# Patient Record
Sex: Female | Born: 1990 | Race: Black or African American | Hispanic: No | Marital: Single | Smoking: Former smoker
Health system: Southern US, Community
[De-identification: ages and names within clinical notes are randomized; demographics above are authoritative.]

## PROBLEM LIST (undated history)

## (undated) DIAGNOSIS — O34219 Maternal care for unspecified type scar from previous cesarean delivery: Secondary | ICD-10-CM

## (undated) HISTORY — PX: TONSILLECTOMY: SUR1361

## (undated) HISTORY — DX: Maternal care for unspecified type scar from previous cesarean delivery: O34.219

---

## 2018-04-15 ENCOUNTER — Ambulatory Visit: Payer: Self-pay | Admitting: Family Medicine

## 2018-06-02 ENCOUNTER — Encounter: Payer: Self-pay | Admitting: Family Medicine

## 2018-06-02 ENCOUNTER — Other Ambulatory Visit: Payer: Self-pay

## 2018-06-02 ENCOUNTER — Ambulatory Visit (INDEPENDENT_AMBULATORY_CARE_PROVIDER_SITE_OTHER): Payer: Medicaid Other | Admitting: Family Medicine

## 2018-06-02 VITALS — BP 112/68 | HR 85 | Temp 98.4°F | Ht 63.0 in | Wt 250.4 lb

## 2018-06-02 DIAGNOSIS — D649 Anemia, unspecified: Secondary | ICD-10-CM | POA: Insufficient documentation

## 2018-06-02 DIAGNOSIS — O99019 Anemia complicating pregnancy, unspecified trimester: Secondary | ICD-10-CM

## 2018-06-02 DIAGNOSIS — Z23 Encounter for immunization: Secondary | ICD-10-CM | POA: Diagnosis not present

## 2018-06-02 DIAGNOSIS — Z114 Encounter for screening for human immunodeficiency virus [HIV]: Secondary | ICD-10-CM

## 2018-06-02 DIAGNOSIS — D509 Iron deficiency anemia, unspecified: Secondary | ICD-10-CM | POA: Insufficient documentation

## 2018-06-02 DIAGNOSIS — Z0000 Encounter for general adult medical examination without abnormal findings: Secondary | ICD-10-CM

## 2018-06-02 DIAGNOSIS — D508 Other iron deficiency anemias: Secondary | ICD-10-CM

## 2018-06-02 DIAGNOSIS — L2082 Flexural eczema: Secondary | ICD-10-CM

## 2018-06-02 HISTORY — DX: Anemia complicating pregnancy, unspecified trimester: O99.019

## 2018-06-02 MED ORDER — TRIAMCINOLONE ACETONIDE 0.05 % EX OINT: 1.0000 "application " | TOPICAL_OINTMENT | Freq: Every day | CUTANEOUS | 0 refills | Status: DC

## 2018-06-02 NOTE — Patient Instructions (Addendum)
It was nice meeting you today Chelsea Baldwin!  I am prescribing a triamcinolone ointment for your eczema.  Apply this once per day and stop using this medication after two weeks.  You can start it back as needed after stopping it for a week or two.  Today, we are giving you your flu shot and Tdap shot.  We are also getting some blood tests and will let you know if they are abnormal.  For your throat, try Tylenol, Zyrtec, and hot tea with honey and lemon.  If you have any questions or concerns, please feel free to call the clinic.   Be well,  Dr. Frances Furbish  DASH Eating Plan DASH stands for "Dietary Approaches to Stop Hypertension." The DASH eating plan is a healthy eating plan that has been shown to reduce high blood pressure (hypertension). It may also reduce your risk for type 2 diabetes, heart disease, and stroke. The DASH eating plan may also help with weight loss. What are tips for following this plan? General guidelines  Avoid eating more than 2,300 mg (milligrams) of salt (sodium) a day. If you have hypertension, you may need to reduce your sodium intake to 1,500 mg a day.  Limit alcohol intake to no more than 1 drink a day for nonpregnant women and 2 drinks a day for men. One drink equals 12 oz of beer, 5 oz of wine, or 1 oz of hard liquor.  Work with your health care provider to maintain a healthy body weight or to lose weight. Ask what an ideal weight is for you.  Get at least 30 minutes of exercise that causes your heart to beat faster (aerobic exercise) most days of the week. Activities may include walking, swimming, or biking.  Work with your health care provider or diet and nutrition specialist (dietitian) to adjust your eating plan to your individual calorie needs. Reading food labels  Check food labels for the amount of sodium per serving. Choose foods with less than 5 percent of the Daily Value of sodium. Generally, foods with less than 300 mg of sodium per serving fit into  this eating plan.  To find whole grains, look for the word "whole" as the first word in the ingredient list. Shopping  Buy products labeled as "low-sodium" or "no salt added."  Buy fresh foods. Avoid canned foods and premade or frozen meals. Cooking  Avoid adding salt when cooking. Use salt-free seasonings or herbs instead of table salt or sea salt. Check with your health care provider or pharmacist before using salt substitutes.  Do not fry foods. Cook foods using healthy methods such as baking, boiling, grilling, and broiling instead.  Cook with heart-healthy oils, such as olive, canola, soybean, or sunflower oil. Meal planning   Eat a balanced diet that includes: ? 5 or more servings of fruits and vegetables each day. At each meal, try to fill half of your plate with fruits and vegetables. ? Up to 6-8 servings of whole grains each day. ? Less than 6 oz of lean meat, poultry, or fish each day. A 3-oz serving of meat is about the same size as a deck of cards. One egg equals 1 oz. ? 2 servings of low-fat dairy each day. ? A serving of nuts, seeds, or beans 5 times each week. ? Heart-healthy fats. Healthy fats called Omega-3 fatty acids are found in foods such as flaxseeds and coldwater fish, like sardines, salmon, and mackerel.  Limit how much you eat of the  following: ? Canned or prepackaged foods. ? Food that is high in trans fat, such as fried foods. ? Food that is high in saturated fat, such as fatty meat. ? Sweets, desserts, sugary drinks, and other foods with added sugar. ? Full-fat dairy products.  Do not salt foods before eating.  Try to eat at least 2 vegetarian meals each week.  Eat more home-cooked food and less restaurant, buffet, and fast food.  When eating at a restaurant, ask that your food be prepared with less salt or no salt, if possible. What foods are recommended? The items listed may not be a complete list. Talk with your dietitian about what dietary  choices are best for you. Grains Whole-grain or whole-wheat bread. Whole-grain or whole-wheat pasta. Brown rice. Orpah Cobb. Bulgur. Whole-grain and low-sodium cereals. Pita bread. Low-fat, low-sodium crackers. Whole-wheat flour tortillas. Vegetables Fresh or frozen vegetables (raw, steamed, roasted, or grilled). Low-sodium or reduced-sodium tomato and vegetable juice. Low-sodium or reduced-sodium tomato sauce and tomato paste. Low-sodium or reduced-sodium canned vegetables. Fruits All fresh, dried, or frozen fruit. Canned fruit in natural juice (without added sugar). Meat and other protein foods Skinless chicken or [place]. Ground chicken or [place]. Pork with fat trimmed off. Fish and seafood. Egg whites. Dried beans, peas, or lentils. Unsalted nuts, nut butters, and seeds. Unsalted canned beans. Lean cuts of beef with fat trimmed off. Low-sodium, lean deli meat. Dairy Low-fat (1%) or fat-free (skim) milk. Fat-free, low-fat, or reduced-fat cheeses. Nonfat, low-sodium ricotta or cottage cheese. Low-fat or nonfat yogurt. Low-fat, low-sodium cheese. Fats and oils Soft margarine without trans fats. Vegetable oil. Low-fat, reduced-fat, or light mayonnaise and salad dressings (reduced-sodium). Canola, safflower, olive, soybean, and sunflower oils. Avocado. Seasoning and other foods Herbs. Spices. Seasoning mixes without salt. Unsalted popcorn and pretzels. Fat-free sweets. What foods are not recommended? The items listed may not be a complete list. Talk with your dietitian about what dietary choices are best for you. Grains Baked goods made with fat, such as croissants, muffins, or some breads. Dry pasta or rice meal packs. Vegetables Creamed or fried vegetables. Vegetables in a cheese sauce. Regular canned vegetables (not low-sodium or reduced-sodium). Regular canned tomato sauce and paste (not low-sodium or reduced-sodium). Regular tomato and vegetable juice (not low-sodium or reduced-sodium).  Rosita Fire. Olives. Fruits Canned fruit in a light or heavy syrup. Fried fruit. Fruit in cream or butter sauce. Meat and other protein foods Fatty cuts of meat. Ribs. Fried meat. Tomasa Blase. Sausage. Bologna and other processed lunch meats. Salami. Fatback. Hotdogs. Bratwurst. Salted nuts and seeds. Canned beans with added salt. Canned or smoked fish. Whole eggs or egg yolks. Chicken or [place] with skin. Dairy Whole or 2% milk, cream, and half-and-half. Whole or full-fat cream cheese. Whole-fat or sweetened yogurt. Full-fat cheese. Nondairy creamers. Whipped toppings. Processed cheese and cheese spreads. Fats and oils Butter. Stick margarine. Lard. Shortening. Ghee. Bacon fat. Tropical oils, such as coconut, palm kernel, or palm oil. Seasoning and other foods Salted popcorn and pretzels. Onion salt, garlic salt, seasoned salt, table salt, and sea salt. Worcestershire sauce. Tartar sauce. Barbecue sauce. Teriyaki sauce. Soy sauce, including reduced-sodium. Steak sauce. Canned and packaged gravies. Fish sauce. Oyster sauce. Cocktail sauce. Horseradish that you find on the shelf. Ketchup. Mustard. Meat flavorings and tenderizers. Bouillon cubes. Hot sauce and Tabasco sauce. Premade or packaged marinades. Premade or packaged taco seasonings. Relishes. Regular salad dressings. Where to find more information:  National Heart, Lung, and Blood Institute: PopSteam.is  American Heart Association: www.heart.org  Summary  The DASH eating plan is a healthy eating plan that has been shown to reduce high blood pressure (hypertension). It may also reduce your risk for type 2 diabetes, heart disease, and stroke.  With the DASH eating plan, you should limit salt (sodium) intake to 2,300 mg a day. If you have hypertension, you may need to reduce your sodium intake to 1,500 mg a day.  When on the DASH eating plan, aim to eat more fresh fruits and vegetables, whole grains, lean proteins, low-fat dairy, and  heart-healthy fats.  Work with your health care provider or diet and nutrition specialist (dietitian) to adjust your eating plan to your individual calorie needs. This information is not intended to replace advice given to you by your health care provider. Make sure you discuss any questions you have with your health care provider. Document Released: 07/26/2011 Document Revised: 07/30/2016 Document Reviewed: 07/30/2016 Elsevier Interactive Patient Education  [employer].

## 2018-06-02 NOTE — Progress Notes (Signed)
   Subjective:    Chelsea Baldwin - 27 y.o. female MRN [id number]  Date of birth: 1991/07/29  HPI  Chelsea Baldwin is here to establish care.  She would also like to discuss mucous in her throat and dry patches on her skin.  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  No cigarettes, marijuana, alcohol, other drugs.  Working on having a healthier diet    Health Maintenance:  -Would like flu and Tdap shots today and HIV screening today.  Got her Pap smear earlier this year at another practice. Health Maintenance Due  Topic Date Due  . HIV Screening  05/07/2006  . PAP SMEAR  05/07/2012    -  reports that she has never smoked. She has never used smokeless tobacco. - Review of Systems: Per HPI.  No fever, shortness of breath, chest pain, unintentional weight loss, changes in bowels, changes in urination, easy bleeding, changes in mood, or changes in neurological status - Past Medical History: Patient Active Problem List   Diagnosis Date Noted  . Encounter for medical examination to establish care 06/03/2018  . Encounter for screening for HIV 06/02/2018  . Absolute anemia 06/02/2018  . Flexural eczema 06/02/2018   - Medications: reviewed and updated   Objective:   Physical Exam BP 112/68   Pulse 85   Temp 98.4 F (36.9 C) (Oral)   Ht 5\' 3"  (1.6 m)   Wt  250 lb 6.4 oz (113.6 kg)   LMP 05/22/2018 (Approximate)   SpO2 96%   BMI 44.36 kg/m  Gen: NAD, alert, cooperative with exam, well-appearing, obese, pleasant HEENT: NCAT, PERRL, clear conjunctiva, oropharynx clear, supple neck CV: RRR, good S1/S2, no murmur, no edema, capillary refill brisk  Resp: CTABL, no wheezes, non-labored Abd: SNTND, BS present, no guarding or organomegaly Skin: no rashes, normal turgor  Neuro: no gross deficits.  Psych: good insight, alert and oriented        Assessment & Plan:   Encounter for medical examination to establish care Reviewed patient's history and did a complete physical exam today.  Gave patient flu and Tdap vaccinations.  Will obtain past medical records to document that she has had her Pap smear earlier this year.  Counseled patient on lifestyle modifications and gave handout for DASH eating plan.  Flexural eczema Prescribed triamcinolone cream for treatment.  Absolute anemia Will obtain CBC today.  Encounter for screening for HIV Will screen for HIV today.    Lezlie Octave, M.D. 06/03/2018, 2:57 PM PGY-2, [facility]

## 2018-06-03 ENCOUNTER — Encounter: Payer: Self-pay | Admitting: Family Medicine

## 2018-06-03 DIAGNOSIS — Z0000 Encounter for general adult medical examination without abnormal findings: Secondary | ICD-10-CM | POA: Insufficient documentation

## 2018-06-03 LAB — CBC
HEMATOCRIT: 35.5 % (ref 34.0–46.6)
HEMOGLOBIN: 11.9 g/dL (ref 11.1–15.9)
MCH: 27.5 pg (ref 26.6–33.0)
MCHC: 33.5 g/dL (ref 31.5–35.7)
MCV: 82 fL (ref 79–97)
Platelets: 347 10*3/uL (ref 150–450)
RBC: 4.33 x10E6/uL (ref 3.77–5.28)
RDW: 13.7 % (ref 12.3–15.4)
WBC: 7.4 10*3/uL (ref 3.4–10.8)

## 2018-06-03 LAB — HIV ANTIBODY (ROUTINE TESTING W REFLEX): HIV Screen 4th Generation wRfx: NONREACTIVE

## 2018-06-03 NOTE — Assessment & Plan Note (Signed)
Prescribed triamcinolone cream for treatment.

## 2018-06-03 NOTE — Assessment & Plan Note (Signed)
Will screen for HIV today.

## 2018-06-03 NOTE — Assessment & Plan Note (Signed)
Reviewed patient's history and did a complete physical exam today.  Gave patient flu and Tdap vaccinations.  Will obtain past medical records to document that she has had her Pap smear earlier this year.  Counseled patient on lifestyle modifications and gave handout for DASH eating plan.

## 2018-06-03 NOTE — Assessment & Plan Note (Signed)
Will obtain CBC today  

## 2018-06-19 ENCOUNTER — Telehealth: Payer: Self-pay | Admitting: *Deleted

## 2018-06-19 ENCOUNTER — Other Ambulatory Visit: Payer: Self-pay | Admitting: Family Medicine

## 2018-06-19 MED ORDER — TRIAMCINOLONE ACETONIDE 0.1 % EX CREA: 1.0000 "application " | TOPICAL_CREAM | Freq: Two times a day (BID) | CUTANEOUS | 0 refills | Status: DC

## 2018-06-19 NOTE — Telephone Encounter (Signed)
The triamcinolone 0.05% is on national backorder and they are not sure when it will be available.   Pharmacy is requesting to have script changed to the 1% in place of the above.   Will forward to MD to send in new script if appropriate. Fleeger, Maryjo Rochester, CMA

## 2018-06-19 NOTE — Telephone Encounter (Signed)
Triamcinolone 0.1% cream ordered.  Thanks!

## 2018-09-12 ENCOUNTER — Ambulatory Visit: Payer: Medicaid Other | Admitting: Family Medicine

## 2018-09-15 ENCOUNTER — Ambulatory Visit: Payer: Medicaid Other | Admitting: Family Medicine

## 2018-09-15 ENCOUNTER — Encounter: Payer: Self-pay | Admitting: Family Medicine

## 2018-09-15 ENCOUNTER — Ambulatory Visit: Payer: Medicaid Other

## 2018-09-15 ENCOUNTER — Other Ambulatory Visit: Payer: Self-pay

## 2018-09-15 DIAGNOSIS — Z3046 Encounter for surveillance of implantable subdermal contraceptive: Secondary | ICD-10-CM

## 2018-09-15 NOTE — Patient Instructions (Signed)
Call if redness or swelling at the site. Call if fever.

## 2018-09-15 NOTE — Progress Notes (Signed)
Established Patient Office Visit  Subjective:  Patient ID: Chelsea Baldwin, female    DOB: 06-May-1991  Age: 28 y.o. MRN: [id number]  CC:  Chief Complaint  Patient presents with  . Nexplanon Removal    HPI Chelsea Baldwin presents for nexplanon removal.  Having irregular periods.  Patient not currently in a sexual relationship.  Plans to use condoms or depo if she starts.  Nexplanon has been in "about 2 years."    History reviewed. No pertinent past medical history.  History reviewed. No pertinent surgical history.  History reviewed. No pertinent family history.  Social History   Socioeconomic History  . Marital status: Single    Spouse name: Not on file  . Number of children: Not on file  . Years of education: Not on file  . Highest education level: Not on file  Occupational History  . Not on file  Social Needs  . Financial resource strain: Not on file  . Food insecurity:    Worry: Not on file    Inability: Not on file  . Transportation needs:    Medical: Not on file    Non-medical: Not on file  Tobacco Use  . Smoking status: Never Smoker  . Smokeless tobacco: Never Used  Substance and Sexual Activity  . Alcohol use: Not on file  . Drug use: Not on file  . Sexual activity: Not on file  Lifestyle  . Physical activity:    Days per week: Not on file    Minutes per session: Not on file  . Stress: Not on file  Relationships  . Social connections:    Talks on phone: Not on file    Gets together: Not on file    Attends religious service: Not on file    Active member of club or organization: Not on file    Attends meetings of clubs or organizations: Not on file    Relationship status: Not on file  . Intimate partner violence:    Fear of current or ex partner: Not on file    Emotionally abused: Not on file    Physically abused: Not on file    Forced sexual activity: Not on file  Other Topics Concern  . Not on file  Social History Narrative  . Not on  file    Outpatient Medications Prior to Visit  Medication Sig Dispense Refill  . triamcinolone cream (KENALOG) 0.1 % Apply 1 application topically 2 (two) times daily. 30 g 0   No facility-administered medications prior to visit.     No Known Allergies  ROS Review of Systems    Objective:    Physical Exam  BP 108/74   Pulse 65   Temp 98.2 F (36.8 C) (Oral)   Ht 5' 3"  (1.6 m)   Wt 265 lb (120.2 kg)   LMP 09/15/2018   SpO2 99%   BMI 46.94 kg/m  Wt Readings from Last 3 Encounters:  09/15/18 265 lb (120.2 kg)  06/02/18 250 lb 6.4 oz (113.6 kg)   Nexplanon easily palpable in left arm.  Informed consent and time out taken.  Sterile prep and drape 2% lidocaine with epi.  Neplanon removed via 0.5cm incision without difficulty.  Patient tolerated procedure well and left clinic in good condition.  Health Maintenance Due  Topic Date Due  . PAP-Cervical Cytology Screening  05/07/2012  . PAP SMEAR-Modifier  05/07/2012    There are no preventive care reminders to display for this patient.  No results  found for: TSH Lab Results  Component Value Date   WBC 7.4 06/02/2018   HGB 11.9 06/02/2018   HCT 35.5 06/02/2018   MCV 82 06/02/2018   PLT 347 06/02/2018   No results found for: NA, K, CHLORIDE, CO2, GLUCOSE, BUN, CREATININE, BILITOT, ALKPHOS, AST, ALT, PROT, ALBUMIN, CALCIUM, ANIONGAP, EGFR, GFR No results found for: CHOL No results found for: HDL No results found for: LDLCALC No results found for: TRIG No results found for: CHOLHDL No results found for: HGBA1C    Assessment & Plan:   Problem List Items Addressed This Visit    None      No orders of the defined types were placed in this encounter.   Follow-up: No follow-ups on file.    Zenia Resides, MD

## 2018-09-15 NOTE — Assessment & Plan Note (Signed)
Removed without difficulty 

## 2018-10-30 ENCOUNTER — Encounter: Payer: Self-pay | Admitting: Family Medicine

## 2019-05-19 DIAGNOSIS — Z3200 Encounter for pregnancy test, result unknown: Secondary | ICD-10-CM | POA: Diagnosis not present

## 2019-05-19 DIAGNOSIS — Z3009 Encounter for other general counseling and advice on contraception: Secondary | ICD-10-CM | POA: Diagnosis not present

## 2019-06-10 ENCOUNTER — Other Ambulatory Visit: Payer: Self-pay

## 2019-06-10 ENCOUNTER — Ambulatory Visit (INDEPENDENT_AMBULATORY_CARE_PROVIDER_SITE_OTHER): Payer: Medicaid Other | Admitting: Family Medicine

## 2019-06-10 VITALS — BP 98/64 | HR 62 | Wt 249.0 lb

## 2019-06-10 DIAGNOSIS — Z3201 Encounter for pregnancy test, result positive: Secondary | ICD-10-CM | POA: Diagnosis not present

## 2019-06-10 DIAGNOSIS — Z3200 Encounter for pregnancy test, result unknown: Secondary | ICD-10-CM | POA: Diagnosis not present

## 2019-06-10 LAB — POCT URINE PREGNANCY: Preg Test, Ur: POSITIVE — AB

## 2019-06-10 NOTE — Patient Instructions (Addendum)
It was nice seeing you today, Chelsea Baldwin!  Congratulations on your pregnancy.  Today, we are getting prenatal labs and scheduling a dating ultrasound for you.  Please try MiraLAX for your constipation, and let me know if you need any medicine for your nausea if small meals and frequent sips of water are not helpful.  Please try prenatal yoga online, frequent walking, and Tylenol for your sciatica.  Continue taking prenatal vitamins daily.  You can switch to another type if that works better for you.  We will see you in the next week or 2 for your initial prenatal appointment.  If you have any questions or concerns, please feel free to call the clinic.   Be well,  Dr. Shan Levans

## 2019-06-10 NOTE — Assessment & Plan Note (Addendum)
Patient was counseled to continue taking prenatal vitamins daily, although she can take a prenatal vitamin without iron currently due to her constipation.  She was encouraged to use MiraLAX as well as getting daily exercise, drinking plenty of water, and eating plenty of fruits and vegetables for her constipation.  She was encouraged to eat small meals and taken small sips of water to improve her nausea.  Also was offered Diclegis, but she would like to hold off on this for now.  A dating ultrasound was ordered on patient request, and OB panel with HIV and hemoglobin fraction was obtained today.  She will plan to return in the next week or 2 for her initial OB appointment.  A UA with urine culture as well as GC/chlamydia testing will need to be performed at her next visit.  She is also a candidate for early Glucola testing due to her obesity and ethnicity.

## 2019-06-10 NOTE — Progress Notes (Signed)
   Subjective:    Chelsea Baldwin - 28 y.o. female MRN [id number]  Date of birth: December 09, 1990  CC:  Phyillis Baldwin is here for a positive pregnancy test.  HPI: Chelsea Baldwin says that she came in because she had a positive pregnancy test.  She is excited for her pregnancy.  Her last menstrual period was 04/18/2019.  She says that her periods are regular.  She denies vaginal bleeding and cramping so far in her pregnancy, although she does have nausea and fatigue as well as some constipation.  She is taking prenatal vitamins and does not smoke or consume alcohol.  Father of the baby and her family are supportive.  Health Maintenance:  -Declines flu shot today but will plan to get it at her next visit Health Maintenance Due  Topic Date Due  . INFLUENZA VACCINE  03/21/2019    -  reports that she has never smoked. She has never used smokeless tobacco. - Review of Systems: Per HPI. - Past Medical History: Patient Active Problem List   Diagnosis Date Noted  . Pregnancy confirmed by positive urine test 06/10/2019  . Encounter for Nexplanon removal 09/15/2018  . Encounter for medical examination to establish care 06/03/2018  . Encounter for screening for HIV 06/02/2018  . Absolute anemia 06/02/2018  . Flexural eczema 06/02/2018   - Medications: reviewed and updated   Objective:   Physical Exam BP 98/64   Pulse 62   Wt 249 lb (112.9 kg)   LMP 04/08/2019   SpO2 99%   BMI 44.11 kg/m  Gen: NAD, alert, cooperative with exam, well-appearing HEENT: NCAT, clear conjunctiva CV: RRR, good S1/S2, no murmur, no edema, capillary refill brisk  Resp: CTABL, no wheezes, non-labored     Assessment & Plan:   Pregnancy confirmed by positive urine test Patient was counseled to continue taking prenatal vitamins daily, although she can take a prenatal vitamin without iron currently due to her constipation.  She was encouraged to use MiraLAX as well as getting daily exercise, drinking plenty of  water, and eating plenty of fruits and vegetables for her constipation.  She was encouraged to eat small meals and taken small sips of water to improve her nausea.  Also was offered Diclegis, but she would like to hold off on this for now.  A dating ultrasound was ordered on patient request, and OB panel with HIV and hemoglobin fraction was obtained today.  She will plan to return in the next week or 2 for her initial OB appointment.  A UA with urine culture as well as GC/chlamydia testing will need to be performed at her next visit.  She is also a candidate for early Glucola testing due to her obesity and ethnicity.    Maia Breslow, M.D. 06/10/2019, 3:28 PM PGY-3, [place]

## 2019-06-12 LAB — OBSTETRIC PANEL, INCLUDING HIV
Antibody Screen: NEGATIVE
Basophils Absolute: 0 10*3/uL (ref 0.0–0.2)
Basos: 0 %
EOS (ABSOLUTE): 0.1 10*3/uL (ref 0.0–0.4)
Eos: 1 %
HIV Screen 4th Generation wRfx: NONREACTIVE
Hematocrit: 35.9 % (ref 34.0–46.6)
Hemoglobin: 12.1 g/dL (ref 11.1–15.9)
Hepatitis B Surface Ag: NEGATIVE
Immature Grans (Abs): 0 10*3/uL (ref 0.0–0.1)
Immature Granulocytes: 0 %
Lymphocytes Absolute: 2.1 10*3/uL (ref 0.7–3.1)
Lymphs: 27 %
MCH: 28.1 pg (ref 26.6–33.0)
MCHC: 33.7 g/dL (ref 31.5–35.7)
MCV: 84 fL (ref 79–97)
Monocytes Absolute: 0.4 10*3/uL (ref 0.1–0.9)
Monocytes: 5 %
Neutrophils Absolute: 5.2 10*3/uL (ref 1.4–7.0)
Neutrophils: 67 %
Platelets: 268 10*3/uL (ref 150–450)
RBC: 4.3 x10E6/uL (ref 3.77–5.28)
RDW: 15.1 % (ref 11.7–15.4)
RPR Ser Ql: NONREACTIVE
Rh Factor: POSITIVE
Rubella Antibodies, IGG: 6.11 index (ref >0.99)
WBC: 7.8 10*3/uL (ref 3.4–10.8)

## 2019-06-12 LAB — HGB FRAC. W/SOLUBILITY
Hgb A2 Quant: 3 % (ref 1.8–3.2)
Hgb A: 59.4 % — ABNORMAL LOW (ref 96.4–98.8)
Hgb C: 37.6 % — ABNORMAL HIGH
Hgb F Quant: 0 % (ref 0.0–2.0)
Hgb S: 0 %
Hgb Solubility: NEGATIVE
Hgb Variant: 0 %

## 2019-06-17 ENCOUNTER — Ambulatory Visit (HOSPITAL_COMMUNITY)
Admission: RE | Admit: 2019-06-17 | Discharge: 2019-06-17 | Disposition: A | Discharge status: Home / Self Care | Payer: Medicaid Other | Source: Ambulatory Visit | Attending: Family Medicine | Admitting: Family Medicine

## 2019-06-17 ENCOUNTER — Other Ambulatory Visit: Payer: Self-pay

## 2019-06-17 DIAGNOSIS — Z3201 Encounter for pregnancy test, result positive: Secondary | ICD-10-CM | POA: Diagnosis not present

## 2019-06-17 DIAGNOSIS — O26841 Uterine size-date discrepancy, first trimester: Secondary | ICD-10-CM | POA: Diagnosis not present

## 2019-06-17 DIAGNOSIS — Z3A09 9 weeks gestation of pregnancy: Secondary | ICD-10-CM | POA: Diagnosis not present

## 2019-06-18 ENCOUNTER — Ambulatory Visit (HOSPITAL_COMMUNITY): Payer: Medicaid Other

## 2019-06-22 ENCOUNTER — Telehealth: Payer: Self-pay

## 2019-06-22 NOTE — Telephone Encounter (Signed)
Pt calling to get results from ultrasound.Chelsea Baldwin, CMA

## 2019-06-22 NOTE — Telephone Encounter (Signed)
Left a voice message for Chelsea Baldwin informing her of her ultrasound results, which show a single fetus and a due date of 01/20/2020.  I will also release these results through MyChart for her.  I encouraged her to call us if she has any questions.

## 2019-06-24 ENCOUNTER — Ambulatory Visit: Payer: Medicaid Other

## 2019-06-24 DIAGNOSIS — Z03818 Encounter for observation for suspected exposure to other biological agents ruled out: Secondary | ICD-10-CM | POA: Diagnosis not present

## 2019-06-29 ENCOUNTER — Encounter: Payer: Self-pay | Admitting: Family Medicine

## 2019-06-29 ENCOUNTER — Ambulatory Visit: Payer: Medicaid Other | Admitting: Family Medicine

## 2019-06-29 ENCOUNTER — Other Ambulatory Visit (HOSPITAL_COMMUNITY)
Admission: RE | Admit: 2019-06-29 | Discharge: 2019-06-29 | Disposition: A | Discharge status: Home / Self Care | Payer: Medicaid Other | Source: Ambulatory Visit | Attending: Family Medicine | Admitting: Family Medicine

## 2019-06-29 ENCOUNTER — Other Ambulatory Visit: Payer: Self-pay

## 2019-06-29 VITALS — BP 110/62 | HR 75 | Wt 243.8 lb

## 2019-06-29 DIAGNOSIS — N898 Other specified noninflammatory disorders of vagina: Secondary | ICD-10-CM

## 2019-06-29 LAB — POCT WET PREP (WET MOUNT)
Clue Cells Wet Prep Whiff POC: POSITIVE
Trichomonas Wet Prep HPF POC: ABSENT

## 2019-06-29 MED ORDER — METRONIDAZOLE 500 MG PO TABS: 500.0000 mg | ORAL_TABLET | Freq: Three times a day (TID) | ORAL | 0 refills | Status: DC

## 2019-06-29 NOTE — Progress Notes (Addendum)
     Subjective:  HPI: Chelsea Baldwin is a 28 y.o. presenting to clinic today to discuss the following:  Vaginal Odor One week of vaginal odor with thin, milky discharge. No rash, no bleeding, no pain. Positive pregnancy test on 05/22/2019. No complications reported today. No new sexual partners, no rashes, no pain in the genital region.   ROS noted in HPI.    Social History   Tobacco Use  Smoking Status Never Smoker  Smokeless Tobacco Never Used    Objective: BP 110/62   Pulse 75   Wt 243 lb 12.8 oz (110.6 kg)   LMP 04/08/2019   SpO2 97%   BMI 43.19 kg/m  Vitals and nursing notes reviewed  Physical Exam Exam conducted with a chaperone present.  Constitutional:      Appearance: Normal appearance. She is not ill-appearing.  Abdominal:     Hernia: There is no hernia in the left inguinal area or right inguinal area.  Genitourinary:    General: Normal vulva.     Exam position: Supine.     Pubic Area: No rash.      Labia:        Right: No rash, tenderness, lesion or injury.        Left: No rash, tenderness, lesion or injury.      Vagina: No signs of injury and foreign body. Vaginal discharge present. No erythema, tenderness, bleeding or lesions.     Cervix: Discharge present. No cervical motion tenderness, friability, lesion, erythema or cervical bleeding.     Uterus: Normal.   Neurological:     Mental Status: She is alert.      Results for orders placed or performed in visit on 06/29/19 (from the past 72 hour(s))  POCT Wet Prep [facility])     Status: Abnormal   Collection Time: 06/29/19  3:15 PM  Result Value Ref Range   Source Wet Prep POC VAG    WBC, Wet Prep HPF POC 1-3    Bacteria Wet Prep HPF POC Many (A) Few   Clue Cells Wet Prep HPF POC Moderate (A) None   Clue Cells Wet Prep Whiff POC Positive Whiff    Yeast Wet Prep HPF POC None None   KOH Wet Prep POC None None   Trichomonas Wet Prep HPF POC Absent Absent    Assessment/Plan:  Vaginal odor  - Wet prep, GC/CC, RPR. - HIV on 10/2 with prenatal 1st trimester labs and no new partners so did not repeat. - Positive for clue cells and whiff test; metronidazole 500mg  BID x 7 days     PATIENT EDUCATION PROVIDED: See AVS    Diagnosis and plan along with any newly prescribed medication(s) were discussed in detail with this patient today. The patient verbalized understanding and agreed with the plan. Patient advised if symptoms worsen return to clinic or ER.   Orders Placed This Encounter  Procedures  . RPR    Standing Status:   Future    Standing Expiration Date:   07/29/2019  . POCT Wet Prep [facility])    Meds ordered this encounter  Medications  . metroNIDAZOLE (FLAGYL) 500 MG tablet    Sig: Take 1 tablet (500 mg total) by mouth 3 (three) times daily.    Dispense:  21 tablet    Refill:  0     Harolyn Rutherford, DO 06/29/2019, 3:04 PM PGY-3 [place]

## 2019-06-29 NOTE — Addendum Note (Signed)
Addended by: Ezzard Flax on: 06/29/2019 03:35 PM   Modules accepted: Orders

## 2019-06-29 NOTE — Patient Instructions (Signed)

## 2019-06-29 NOTE — Assessment & Plan Note (Addendum)
-   Wet prep, GC/CC, - Intended to get RPR but patient left without going to the lab. Put in as future order and can be obtained on 11/19 at initial OB appointment. - HIV on 10/2 with prenatal 1st trimester labs and no new partners so did not repeat. - Positive for clue cells and whiff test; metronidazole 500mg  BID x 7 days

## 2019-06-30 LAB — CERVICOVAGINAL ANCILLARY ONLY
Chlamydia: NEGATIVE
Comment: NEGATIVE
Comment: NORMAL
Neisseria Gonorrhea: NEGATIVE

## 2019-07-09 ENCOUNTER — Other Ambulatory Visit: Payer: Self-pay

## 2019-07-09 ENCOUNTER — Encounter: Payer: Self-pay | Admitting: Family Medicine

## 2019-07-09 ENCOUNTER — Ambulatory Visit: Payer: Medicaid Other | Admitting: Family Medicine

## 2019-07-09 VITALS — BP 100/60 | HR 83 | Wt 241.2 lb

## 2019-07-09 DIAGNOSIS — Z3490 Encounter for supervision of normal pregnancy, unspecified, unspecified trimester: Secondary | ICD-10-CM | POA: Insufficient documentation

## 2019-07-09 DIAGNOSIS — R112 Nausea with vomiting, unspecified: Secondary | ICD-10-CM | POA: Diagnosis not present

## 2019-07-09 DIAGNOSIS — Z3481 Encounter for supervision of other normal pregnancy, first trimester: Secondary | ICD-10-CM

## 2019-07-09 DIAGNOSIS — F1210 Cannabis abuse, uncomplicated: Secondary | ICD-10-CM | POA: Insufficient documentation

## 2019-07-09 DIAGNOSIS — Z3A11 11 weeks gestation of pregnancy: Secondary | ICD-10-CM | POA: Diagnosis not present

## 2019-07-09 LAB — POCT UA - MICROSCOPIC ONLY: Epithelial cells, urine per micros: >20

## 2019-07-09 LAB — POCT URINALYSIS DIP (MANUAL ENTRY)
Bilirubin, UA: NEGATIVE
Glucose, UA: NEGATIVE mg/dL
Ketones, POC UA: NEGATIVE mg/dL
Leukocytes, UA: NEGATIVE
Nitrite, UA: NEGATIVE
Protein Ur, POC: NEGATIVE mg/dL
Spec Grav, UA: 1.025 (ref 1.010–1.025)
Urobilinogen, UA: 0.2 E.U./dL
pH, UA: 6 (ref 5.0–8.0)

## 2019-07-09 LAB — POCT 1 HR PRENATAL GLUCOSE: Glucose 1 Hr Prenatal, POC: 117 mg/dL

## 2019-07-09 MED ORDER — DOXYLAMINE-PYRIDOXINE 10-10 MG PO TBEC: 1.0000 | DELAYED_RELEASE_TABLET | Freq: Every evening | ORAL | 0 refills | Status: DC | PRN

## 2019-07-09 NOTE — Progress Notes (Signed)
Patient Name: Chelsea Baldwin Date of Birth: 1990-09-30 [place] Initial Prenatal   Chelsea Baldwin is a 28 y.o. year old G3P2002 at [redacted]w[redacted]d who presents for her initial prenatal visit. Pregnancy  isplanned She reports nausea. NBNB. Smokes THC occasionally. Counseled that this may make nausea worse and that potentially can have detrimental affects on the fetus. Patient is willing to try a medication for nausea and says that she will quit using THC.  She is taking a prenatal vitamin.  She denies pelvic pain or vaginal bleeding.   The patient is dated by [place].  LMP: Patient's last menstrual period was 04/08/2019.  Period is certain:  Yes.  Periods are regular:  Yes.  Period was typical period:  Yes.  If no to any above, dating ultrasound ordered.  Using hormonal contraception in 3 months prior to conception:  No, if yes, needs dating ultrasound     Lab Review: Blood type: O Rh Status: + Antibody screen Negative if positive, discuss with Ob on call  HIV Negative RPR Negative Hemoglobin electrophoresis reviewed Yes HgC Trait Results of Ob urine culture are pending Rubella Immune Varicella status is Immune. If unknown, varicella IgG ordered. If non-immune, will need vaccination after delivery and discuss need to avoid those with varicella infection.   PMH: Reviewed and as detailed below: HTN: No if yes, consider aspirin after 12 weeks and refer to High Risk Ob  Type 1 or 2 Diabetes: No f yes, refer to High Risk Ob  Depression:  No  Seizure disorder:  No if yes, refer to Neurology and High Risk Ob  VTE: No , if yes,refer to High Risk Ob  History of STI Yes, if yes, repeat testing obtained on 11/09, negative Abnormal Pap smear:  No, Genital herpes simplex:  Unknown, patient has a history of a lesion that she is unsure was HSV.  if yes, valacyclovir at 36w  PSH: Gynecologic Surgery:  Patient had C-section with second pregnancy  Surgical history reviewed.   Obstetric History: Obstetric history tab updated and reviewed.  Cesarean delivery: Yes if yes, schedule with Dr. Kennon Rounds at 32w Gestational Diabetes:  No,  obtain early 1 hour glucose tolerance test Hypertension in pregnancy: No,  consider aspirin therapy starting at 12 weeks  Prior pregnancies: History of preterm birth: No Complications with prior pregnancies: None History of LGA/SGA infant:  No History of shoulder dystocia: No Indications for referral were reviewed, the patient has no obstetric indications for referral to High Risk Ob at this time.   Social History: Partner's name: Maryclare Bean  Tobacco use: No Alcohol use:  No Other substance use:  Yes  Current Medications:  PNV  Reviewed and appropriate in pregnancy.   Genetic and Infection Screen: Flow Sheet Updated Yes  Prenatal Exam: Gen: Well nourished, well developed.  No distress.  Vitals noted. HEENT: Normocephalic, atraumatic.  Neck supple without cervical lymphadenopathy, thyromegaly or thyroid nodules.  fair dentition. CV: RRR no murmur, gallops or rubs Lungs: CTA B.  Normal respiratory effort without wheezes or rales. Abd: soft, NTND. +BS.  Uterus not appreciated above pelvis. GU: Normal external female genitalia without lesions.  Nl vaginal, well rugated without lesions. No vaginal discharge.  Bimanual exam: No adnexal mass or TTP. No CMT.  Uterus size appropriate with first trimester  Ext: No clubbing, cyanosis or edema. Psych: Normal grooming and dress.  Not depressed or anxious appearing.  Normal thought content and process without flight of ideas or looseness of associations  Chaperoned by Clabe Seal, CMA  Assessment/plan: 1) Pregnancy [redacted]w[redacted]d  Current pregnancy issues include Nausea with some noted weightloss. Counseled to stop using THC.  Will start Diclegis.  Patient follow-up in 1 week to reassess hydration status. As dating is, a dating ultrasound has already obtained. Dating tab updated. Prepregnancy weight  updated and expected weight gain this pregnancy is 11-20 pounds  Prenatal labs reviewed, including OB panel, hemoglobin fraction, 1 hour prenatal glucose, UA.  UA positive for trace hemoglobin, remainder negative.  1 hour glucose was negative.  Remainder of labs unremarkable.  Urine will be cultured pending. Fetal heart tones Not assessed*  Indications for referral to HROB were reviewed and the patient does not meet criteria for referral.   Medication list reviewed and updated to include only medications current medications.  Recommended patient see a dentist for regular care.  Prescribed vitamin B6 and doxylamine (Diclegis), with discussion of dose titration. Bleeding and pain precautions reviewed. Importance of prenatal vitamins reviewed.  Genetic screening offered, including first trimester screen with nuchal translucency at 11-13 weeks or quad screen at 16-20 weeks.  Nuchal translucency ordered, however there was no availability due to overbooking from the holidays.  Would recommend patient get quad screening.  Can discuss with patient at appointment next week when reassess for hydration.  The patient is age 44 or over at time of delivery. Referral to genetics was not offered today.  The patient has the following risk factors for preexisting diabetes: BMI > 25 and high risk ethnicity (Latino, [place] American, Native American, [place] Islander, Asian Naval architect) . A 1 hour glucose tolerance test  was ordered as appropriate. The patient will need repeat testing between 24-28 weeks.  PMH and PHQ9 forms completed. PMH given to [place].   PHQ-9 was negative.  Precepted with Dr. Deirdre Priest.   Follow up 1 weeks.

## 2019-07-09 NOTE — Patient Instructions (Addendum)
It was a pleasure to see you today! Thank you for choosing [facility] for your primary care. Chelsea Baldwin was seen for prenatal visit.   Due to this nausea vomiting, we are starting you on medication help you with this.  Please come back in 1 week just to reassess for your weight and see if you have any improvement.   Go to the emergency room if have severe nausea vomiting they cannot control with medication, abdominal pain, or any other OB-related symptoms such as leakage of fluid, vaginal bleeding or severe abdominal pain.Luvenia Heller,  Marny Lowenstein, MD, MS FAMILY MEDICINE RESIDENT - PGY3 07/09/2019 10:15 AM   First Trimester of Pregnancy The first trimester of pregnancy is from week 1 until the end of week 13 (months 1 through 3). A week after a sperm fertilizes an egg, the egg will implant on the wall of the uterus. This embryo will begin to develop into a baby. Genes from you and your partner will form the baby. The female genes will determine whether the baby will be a boy or a girl. At 6-8 weeks, the eyes and face will be formed, and the heartbeat can be seen on ultrasound. At the end of 12 weeks, all the baby's organs will be formed. Now that you are pregnant, you will want to do everything you can to have a healthy baby. Two of the most important things are to get good prenatal care and to follow your health care provider's instructions. Prenatal care is all the medical care you receive before the baby's birth. This care will help prevent, find, and treat any problems during the pregnancy and childbirth. Body changes during your first trimester Your body goes through many changes during pregnancy. The changes vary from woman to woman.  You may gain or lose a couple of pounds at first.  You may feel sick to your stomach (nauseous) and you may throw up (vomit). If the vomiting is uncontrollable, call your health care provider.  You may tire easily.  You may develop headaches  that can be relieved by medicines. All medicines should be approved by your health care provider.  You may urinate more often. Painful urination may mean you have a bladder infection.  You may develop heartburn as a result of your pregnancy.  You may develop constipation because certain hormones are causing the muscles that push stool through your intestines to slow down.  You may develop hemorrhoids or swollen veins (varicose veins).  Your breasts may begin to grow larger and become tender. Your nipples may stick out more, and the tissue that surrounds them (areola) may become darker.  Your gums may bleed and may be sensitive to brushing and flossing.  Dark spots or blotches (chloasma, mask of pregnancy) may develop on your face. This will likely fade after the baby is born.  Your menstrual periods will stop.  You may have a loss of appetite.  You may develop cravings for certain kinds of food.  You may have changes in your emotions from day to day, such as being excited to be pregnant or being concerned that something may go wrong with the pregnancy and baby.  You may have more vivid and strange dreams.  You may have changes in your hair. These can include thickening of your hair, rapid growth, and changes in texture. Some women also have hair loss during or after pregnancy, or hair that feels dry or thin. Your hair will most  likely return to normal after your baby is born. What to expect at prenatal visits During a routine prenatal visit:  You will be weighed to make sure you and the baby are growing normally.  Your blood pressure will be taken.  Your abdomen will be measured to track your baby's growth.  The fetal heartbeat will be listened to between weeks 10 and 14 of your pregnancy.  Test results from any previous visits will be discussed. Your health care provider may ask you:  How you are feeling.  If you are feeling the baby move.  If you have had any abnormal  symptoms, such as leaking fluid, bleeding, severe headaches, or abdominal cramping.  If you are using any tobacco products, including cigarettes, chewing tobacco, and electronic cigarettes.  If you have any questions. Other tests that may be performed during your first trimester include:  Blood tests to find your blood type and to check for the presence of any previous infections. The tests will also be used to check for low iron levels (anemia) and protein on red blood cells (Rh antibodies). Depending on your risk factors, or if you previously had diabetes during pregnancy, you may have tests to check for high blood sugar that affects pregnant women (gestational diabetes).  Urine tests to check for infections, diabetes, or protein in the urine.  An ultrasound to confirm the proper growth and development of the baby.  Fetal screens for spinal cord problems (spina bifida) and Down syndrome.  HIV (human immunodeficiency virus) testing. Routine prenatal testing includes screening for HIV, unless you choose not to have this test.  You may need other tests to make sure you and the baby are doing well. Follow these instructions at home: Medicines  Follow your health care provider's instructions regarding medicine use. Specific medicines may be either safe or unsafe to take during pregnancy.  Take a prenatal vitamin that contains at least 600 micrograms (mcg) of folic acid.  If you develop constipation, try taking a stool softener if your health care provider approves. Eating and drinking   Eat a balanced diet that includes fresh fruits and vegetables, whole grains, good sources of protein such as meat, eggs, or tofu, and low-fat dairy. Your health care provider will help you determine the amount of weight gain that is right for you.  Avoid raw meat and uncooked cheese. These carry germs that can cause birth defects in the baby.  Eating four or five small meals rather than three large meals  a day may help relieve nausea and vomiting. If you start to feel nauseous, eating a few soda crackers can be helpful. Drinking liquids between meals, instead of during meals, also seems to help ease nausea and vomiting.  Limit foods that are high in fat and processed sugars, such as fried and sweet foods.  To prevent constipation: ? Eat foods that are high in fiber, such as fresh fruits and vegetables, whole grains, and beans. ? Drink enough fluid to keep your urine clear or pale yellow. Activity  Exercise only as directed by your health care provider. Most women can continue their usual exercise routine during pregnancy. Try to exercise for 30 minutes at least 5 days a week. Exercising will help you: ? Control your weight. ? Stay in shape. ? Be prepared for labor and delivery.  Experiencing pain or cramping in the lower abdomen or lower back is a good sign that you should stop exercising. Check with your health care provider before  continuing with normal exercises.  Try to avoid standing for long periods of time. Move your legs often if you must stand in one place for a long time.  Avoid heavy lifting.  Wear low-heeled shoes and practice good posture.  You may continue to have sex unless your health care provider tells you not to. Relieving pain and discomfort  Wear a good support bra to relieve breast tenderness.  Take warm sitz baths to soothe any pain or discomfort caused by hemorrhoids. Use hemorrhoid cream if your health care provider approves.  Rest with your legs elevated if you have leg cramps or low back pain.  If you develop varicose veins in your legs, wear support hose. Elevate your feet for 15 minutes, 3-4 times a day. Limit salt in your diet. Prenatal care  Schedule your prenatal visits by the twelfth week of pregnancy. They are usually scheduled monthly at first, then more often in the last 2 months before delivery.  Write down your questions. Take them to your  prenatal visits.  Keep all your prenatal visits as told by your health care provider. This is important. Safety  Wear your seat belt at all times when driving.  Make a list of emergency phone numbers, including numbers for family, friends, the hospital, and police and fire departments. General instructions  Ask your health care provider for a referral to a local prenatal education class. Begin classes no later than the beginning of month 6 of your pregnancy.  Ask for help if you have counseling or nutritional needs during pregnancy. Your health care provider can offer advice or refer you to specialists for help with various needs.  Do not use hot tubs, steam rooms, or saunas.  Do not douche or use tampons or scented sanitary pads.  Do not cross your legs for long periods of time.  Avoid cat litter boxes and soil used by cats. These carry germs that can cause birth defects in the baby and possibly loss of the fetus by miscarriage or stillbirth.  Avoid all smoking, herbs, alcohol, and medicines not prescribed by your health care provider. Chemicals in these products affect the formation and growth of the baby.  Do not use any products that contain nicotine or tobacco, such as cigarettes and e-cigarettes. If you need help quitting, ask your health care provider. You may receive counseling support and other resources to help you quit.  Schedule a dentist appointment. At home, brush your teeth with a soft toothbrush and be gentle when you floss. Contact a health care provider if:  You have dizziness.  You have mild pelvic cramps, pelvic pressure, or nagging pain in the abdominal area.  You have persistent nausea, vomiting, or diarrhea.  You have a bad smelling vaginal discharge.  You have pain when you urinate.  You notice increased swelling in your face, hands, legs, or ankles.  You are exposed to fifth disease or chickenpox.  You are exposed to [place] measles (rubella) and  have never had it. Get help right away if:  You have a fever.  You are leaking fluid from your vagina.  You have spotting or bleeding from your vagina.  You have severe abdominal cramping or pain.  You have rapid weight gain or loss.  You vomit blood or material that looks like coffee grounds.  You develop a severe headache.  You have shortness of breath.  You have any kind of trauma, such as from a fall or a car accident. Summary  The first trimester of pregnancy is from week 1 until the end of week 13 (months 1 through 3).  Your body goes through many changes during pregnancy. The changes vary from woman to woman.  You will have routine prenatal visits. During those visits, your health care provider will examine you, discuss any test results you may have, and talk with you about how you are feeling. This information is not intended to replace advice given to you by your health care provider. Make sure you discuss any questions you have with your health care provider. Document Released: 07/31/2001 Document Revised: 07/19/2017 Document Reviewed: 07/18/2016 Elsevier Patient Education  2020 [employer].

## 2019-07-13 ENCOUNTER — Telehealth: Payer: Self-pay | Admitting: *Deleted

## 2019-07-13 LAB — CULTURE, OB URINE

## 2019-07-13 LAB — URINE CULTURE, OB REFLEX

## 2019-07-13 NOTE — Telephone Encounter (Signed)
LM for patient to call back.  Will inform of negative results when she does.  Janiyha Montufar,CMA

## 2019-07-13 NOTE — Telephone Encounter (Signed)
-----   Message from Bonnita Hollow, MD sent at 07/13/2019  9:04 AM EST ----- OB Urine Culture negative. Will have staff call to inform.

## 2019-07-22 ENCOUNTER — Encounter: Payer: Self-pay | Admitting: Family Medicine

## 2019-07-23 ENCOUNTER — Ambulatory Visit: Payer: Medicaid Other | Admitting: Family Medicine

## 2019-07-23 ENCOUNTER — Other Ambulatory Visit: Payer: Self-pay

## 2019-07-23 VITALS — BP 104/62 | HR 84 | Wt 239.6 lb

## 2019-07-23 DIAGNOSIS — Z23 Encounter for immunization: Secondary | ICD-10-CM

## 2019-07-23 DIAGNOSIS — O99210 Obesity complicating pregnancy, unspecified trimester: Secondary | ICD-10-CM | POA: Insufficient documentation

## 2019-07-23 DIAGNOSIS — O99212 Obesity complicating pregnancy, second trimester: Secondary | ICD-10-CM

## 2019-07-23 DIAGNOSIS — Z3482 Encounter for supervision of other normal pregnancy, second trimester: Secondary | ICD-10-CM

## 2019-07-23 MED ORDER — FLUTICASONE PROPIONATE 50 MCG/ACT NA SUSP: 2.0000 | Freq: Every day | NASAL | 6 refills | Status: DC

## 2019-07-23 NOTE — Patient Instructions (Addendum)
It was wonderful to see you today.  Thank you for choosing [place].   Please call [phone] with any questions about today's appointment.  Please be sure to schedule follow up at the front  desk before you leave today.   Dorris Singh, MD  Family Medicine   For your headache, you can take Tylenol three times per day  Try Flonase for your nasal congestion  Make sure you take  Prenatal every day--set an alarm  Return on December 18th for lab work--- I will call you with results   [name]        The second trimester is from week 14 through week 27 (month 4 through 6). This is often the time in pregnancy that you feel your best. Often times, morning sickness has lessened or quit. You may have more energy, and you may get hungry more often. Your unborn baby is growing rapidly. At the end of the sixth month, he or she is about 9 inches long and weighs about 1 pounds. You will likely feel the baby move between 18 and 20 weeks of pregnancy. Follow these instructions at home: Medicines  Take over-the-counter and prescription medicines only as told by your doctor. Some medicines are safe and some medicines are not safe during pregnancy.  Take a prenatal vitamin that contains at least 600 micrograms (mcg) of folic acid.  If you have trouble pooping (constipation), take medicine that will make your stool soft (stool softener) if your doctor approves. Eating and drinking   Eat regular, healthy meals.  Avoid raw meat and uncooked cheese.  If you get low calcium from the food you eat, talk to your doctor about taking a daily calcium supplement.  Avoid foods that are high in fat and sugars, such as fried and sweet foods.  If you feel sick to your stomach (nauseous) or throw up (vomit): ? Eat 4 or 5 small meals a day instead of 3 large meals. ? Try eating a few soda crackers. ? Drink liquids between meals instead of during  meals.  To prevent constipation: ? Eat foods that are high in fiber, like fresh fruits and vegetables, whole grains, and beans. ? Drink enough fluids to keep your pee (urine) clear or pale yellow. Activity  Exercise only as told by your doctor. Stop exercising if you start to have cramps.  Do not exercise if it is too hot, too humid, or if you are in a place of great height (high altitude).  Avoid heavy lifting.  Wear low-heeled shoes. Sit and stand up straight.  You can continue to have sex unless your doctor tells you not to. Relieving pain and discomfort  Wear a good support bra if your breasts are tender.  Take warm water baths (sitz baths) to soothe pain or discomfort caused by hemorrhoids. Use hemorrhoid cream if your doctor approves.  Rest with your legs raised if you have leg cramps or low back pain.  If you develop puffy, bulging veins (varicose veins) in your legs: ? Wear support hose or compression stockings as told by your doctor. ? Raise (elevate) your feet for 15 minutes, 3-4 times a day. ? Limit salt in your food. Prenatal care  Write down your questions. Take them to your prenatal visits.  Keep all your prenatal visits as told by your doctor. This is important. Safety  Wear your seat belt when driving.  Make a list of emergency phone numbers, including numbers for  family, friends, the hospital, and police and fire departments. General instructions  Ask your doctor about the right foods to eat or for help finding a counselor, if you need these services.  Ask your doctor about local prenatal classes. Begin classes before month 6 of your pregnancy.  Do not use hot tubs, steam rooms, or saunas.  Do not douche or use tampons or scented sanitary pads.  Do not cross your legs for long periods of time.  Visit your dentist if you have not done so. Use a soft toothbrush to brush your teeth. Floss gently.  Avoid all smoking, herbs, and alcohol. Avoid drugs  that are not approved by your doctor.  Do not use any products that contain nicotine or tobacco, such as cigarettes and e-cigarettes. If you need help quitting, ask your doctor.  Avoid cat litter boxes and soil used by cats. These carry germs that can cause birth defects in the baby and can cause a loss of your baby (miscarriage) or stillbirth. Contact a doctor if:  You have mild cramps or pressure in your lower belly.  You have pain when you pee (urinate).  You have bad smelling fluid coming from your vagina.  You continue to feel sick to your stomach (nauseous), throw up (vomit), or have watery poop (diarrhea).  You have a nagging pain in your belly area.  You feel dizzy. Get help right away if:  You have a fever.  You are leaking fluid from your vagina.  You have spotting or bleeding from your vagina.  You have severe belly cramping or pain.  You lose or gain weight rapidly.  You have trouble catching your breath and have chest pain.  You notice sudden or extreme puffiness (swelling) of your face, hands, ankles, feet, or legs.  You have not felt the baby move in over an hour.  You have severe headaches that do not go away when you take medicine.  You have trouble seeing. Summary  The second trimester is from week 14 through week 27 (months 4 through 6). This is often the time in pregnancy that you feel your best.  To take care of yourself and your unborn baby, you will need to eat healthy meals, take medicines only if your doctor tells you to do so, and do activities that are safe for you and your baby.  Call your doctor if you get sick or if you notice anything unusual about your pregnancy. Also, call your doctor if you need help with the right food to eat, or if you want to know what activities are safe for you. This information is not intended to replace advice given to you by your health care provider. Make sure you discuss any questions you have with your health  care provider. Document Released: 10/31/2009 Document Revised: 11/28/2018 Document Reviewed: 09/11/2016 Elsevier Patient Education  2020 [employer].

## 2019-07-23 NOTE — Progress Notes (Signed)
Chelsea Baldwin is a 28 y.o. G3P2002 at [redacted]w[redacted]d here for routine follow up. She is dated by early ultrasound.  She reports reports some sinus congestion and a mild headache.  She denies fevers, green drainage present for 3 days.  She has been certain what she can use for medications. Boyfriend and FOB, Chelsea Baldwin, is present today and very engaged in conversation.   See flow sheet for details.  A/P: Pregnancy at [redacted]w[redacted]d.  Doing well.    Dating reviewed, dating tab is correct Fetal heart tones Appropriate Pregnancy issues include:  Obesity affecting pregnancy, discussed appropriate weight gain. Prepregnancy BMI was >40, expected weight gain 11-15 pounds, majority in 2/3 trimester. Will need usual repeat 1 hour at 24-28 weeks.  History of Cesarean, will need referral to Obstetrics at 32 weeks  Anemia in pregnancy, patient reports anemia with hemoglobin of 7 in G1, resolved with Iron. 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 Will repeat CBC with ferritin in 4 weeks.   Problem list updated: Yes.  Influenza vaccine administered today.    The patient has the following indication for screening preexisting diabetes: BMI > 25 and high risk ethnicity (Latino, [place] American, Native American, [place], Asian Optometrist) . A 1 hour glucose tolerance test obtained at last visit- 117. Will need routine screening at 24-28 weeks.   Patient is interested in genetic screening. As she is past 13 weeks and 6 days, a Quad screen  was offered. Quad screen scheduled between [redacted]w[redacted]d and [redacted]w[redacted]d. Scheduled for lab visit and ordered.   History of Cesarean Delivery discussed with patient she will need referral to scheduling gynecology at 32 weeks for consultation for this.   THC Use discussed at length recommended complete cessation she smokes about 1 pack/day.  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  Infection/Genetic Screen confirmed this was done at first prenatal.  Postnasal drip, recommended flonase and Tylenol. Return precautions/sinusitis precautions given.   Next visit: - Order and schedule anatomy scan   Pregnancy education including expected weight gain in pregnancy, OTC medication use, continued use of prenatal vitamin, and nutrition in pregnancy.   Bleeding and pain precautions reviewed. Follow up 4 weeks.

## 2019-08-07 ENCOUNTER — Other Ambulatory Visit: Payer: Medicaid Other

## 2019-08-07 ENCOUNTER — Other Ambulatory Visit: Payer: Self-pay | Admitting: Family Medicine

## 2019-08-07 ENCOUNTER — Other Ambulatory Visit: Payer: Self-pay

## 2019-08-07 ENCOUNTER — Telehealth: Payer: Self-pay | Admitting: Family Medicine

## 2019-08-07 DIAGNOSIS — Z3482 Encounter for supervision of other normal pregnancy, second trimester: Secondary | ICD-10-CM

## 2019-08-07 DIAGNOSIS — Z3402 Encounter for supervision of normal first pregnancy, second trimester: Secondary | ICD-10-CM | POA: Diagnosis not present

## 2019-08-07 NOTE — Telephone Encounter (Signed)
Quad screen ordered for [facility].   Dorris Singh, MD  [facility] Medicine Teaching Service

## 2019-08-08 LAB — CBC
Hematocrit: 31.2 % — ABNORMAL LOW (ref 34.0–46.6)
Hemoglobin: 10.9 g/dL — ABNORMAL LOW (ref 11.1–15.9)
MCH: 29.1 pg (ref 26.6–33.0)
MCHC: 34.9 g/dL (ref 31.5–35.7)
MCV: 83 fL (ref 79–97)
Platelets: 245 10*3/uL (ref 150–450)
RBC: 3.75 x10E6/uL — ABNORMAL LOW (ref 3.77–5.28)
RDW: 13.3 % (ref 11.7–15.4)
WBC: 7.3 10*3/uL (ref 3.4–10.8)

## 2019-08-08 LAB — FERRITIN: Ferritin: 80 ng/mL (ref 15–150)

## 2019-08-09 ENCOUNTER — Telehealth: Payer: Self-pay | Admitting: Family Medicine

## 2019-08-09 LAB — AFP TETRA
DIA Mom Value: 0.92
DIA Value (EIA): 116.91 pg/mL
DSR (By Age)    1 IN: 805
DSR (Second Trimester) 1 IN: 7275
Gestational Age: 16.2 WEEKS
MSAFP Mom: 1.97
MSAFP: 55.5 ng/mL
MSHCG Mom: 0.59
MSHCG: 17129 m[IU]/mL
Maternal Age At EDD: 28.7 yr
Osb Risk: 1723
T18 (By Age): 1:3134 {titer}
Test Results:: NEGATIVE
Weight: 242 [lb_av]
uE3 Mom: 0.81
uE3 Value: 0.72 ng/mL

## 2019-08-09 NOTE — Telephone Encounter (Signed)
Attempted to call patient---recent quad screen returned NORMAL. All other labs normal.  Dorris Singh, MD  [facility] Medicine Teaching Service

## 2019-08-10 NOTE — Telephone Encounter (Signed)
Attempted to call patient again, left voicemail, generic.   Dorris Singh, MD  Family Medicine Teaching Service

## 2019-08-10 NOTE — Telephone Encounter (Signed)
Patient LVM on nurse line returning PCP call. I attempted to call her back to inform of normal labs, however no answer. Will await her return call.

## 2019-08-19 ENCOUNTER — Encounter: Payer: Medicaid Other | Admitting: Family Medicine

## 2019-08-21 HISTORY — DX: Maternal care for unspecified type scar from previous cesarean delivery: O34.219

## 2019-08-21 NOTE — L&D Delivery Note (Addendum)
Patient is 29 y.o. 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  APGAR: 9, 9; weight  .   Placenta status: Spontaneous, Intact.  Cord: 3 vessels with the following complications: None.   Anesthesia: None Episiotomy: None Lacerations: bilateral periuretheral, R side repaired Suture Repair: 4-0 monocryl Est. Blood Loss (mL):  100 cc  Mom to postpartum.  Baby to Couplet care / Skin to Skin.  Upon arrival patient was complete and pushing. She pushed with good maternal effort to deliver a healthy baby boy. Baby delivered without difficulty, was noted to have good tone and place on maternal abdomen for oral suctioning, drying and stimulation. Delayed cord clamping performed. Placenta delivered intact with 3V cord. Vaginal canal and perineum was inspected and found to have an intact perineal with small bilateral periurethral lacerations, slightly larger on the right.  The right laceration was repaired and left hemostatic. Pitocin was started and uterus massaged until bleeding slowed. Counts of sharps, instruments, and lap pads were all correct.   Mirian Mo, MD PGY-2 5/28/20216:40 PM  OB FELLOW ATTESTATION  I was present, gloved, and supervising throughout delivery and have edited the above note to reflect any changes or updates.  Zack Seal, MD/MPH OB Fellow  01/15/2020, 7:07 PM

## 2019-08-24 ENCOUNTER — Ambulatory Visit (INDEPENDENT_AMBULATORY_CARE_PROVIDER_SITE_OTHER): Payer: Medicaid Other | Admitting: Family Medicine

## 2019-08-24 ENCOUNTER — Other Ambulatory Visit: Payer: Self-pay

## 2019-08-24 VITALS — BP 105/70 | HR 68 | Wt 240.0 lb

## 2019-08-24 DIAGNOSIS — R200 Anesthesia of skin: Secondary | ICD-10-CM

## 2019-08-24 DIAGNOSIS — M545 Low back pain, unspecified: Secondary | ICD-10-CM

## 2019-08-24 DIAGNOSIS — Z3482 Encounter for supervision of other normal pregnancy, second trimester: Secondary | ICD-10-CM

## 2019-08-24 NOTE — Patient Instructions (Signed)
It was great meeting you today!  I am glad that things have been going well.  I am sorry about your arm numbness.  He can repeat the same stretches that you had your first pregnancy.  You can also try capsaicin ointment, I gave you a handout for this.  I gave you some stretches for your lower back during pregnancy.  These help some people.  We got you scheduled for an anatomy ultrasound.  Please come back in 4 weeks for another prenatal visit.

## 2019-08-25 DIAGNOSIS — R200 Anesthesia of skin: Secondary | ICD-10-CM | POA: Insufficient documentation

## 2019-08-25 DIAGNOSIS — M545 Low back pain, unspecified: Secondary | ICD-10-CM | POA: Insufficient documentation

## 2019-08-25 HISTORY — DX: Anesthesia of skin: R20.0

## 2019-08-25 HISTORY — DX: Low back pain, unspecified: M54.50

## 2019-08-25 NOTE — Progress Notes (Signed)
HPI 29 year old female who presents for OB visit.  Patient is a G3 P2 at 18 weeks 5 days.  She reports 2 issues.  One is low back pain.  States that this occurred in the second trimester of her previous pregnancies.  The other problem is lateral arm numbness which starts in her shoulder blade and terminates in her fingertips.  She states this also appeared with her previous pregnancies although this time it is more severe.  For her back pain she is interested in trying some stretches to help this.  Regards to her left arm numbness she states that she was just given some stretches to be last time and these provided mild relief.  She has had no bleeding, gushes of fluid, intractable pain, or any other alarming symptoms.  Of note the patient does have a history of cesarean section and will need a referral to obstetrics at 32 weeks.  At this visit she is only overdue for an anatomy ultrasound.  CC: OB visit   ROS:   Review of Systems See HPI for ROS.   CC, SH/smoking status, and VS noted  Objective: BP 105/70   Pulse 68   Wt 240 lb (108.9 kg)   LMP 04/08/2019   BMI 42.51 kg/m  Gen: 29 year old African-American female, no acute distress CV: Regular rate rhythm, no M/R/G Resp: Lungs clear to auscultation bilaterally, no accessory muscle use Neuro: A&O x3, no focal neurologic deficit, sensation intact all dermatomal distributions of left arm, 5 out of 5 strength on extension and flexion bicep and tricep.  5 out of 5 grip strength OB Fundal height 18 cm Fetal heart rate: 141 to 146 bpm   Assessment and plan:  Supervision of normal pregnancy This point patient doing very well with with only issues being low back pain and left arm numbness.  Fortunately no strength deficit or severe sensory deficit.  Reassuring that she has had this problem in a previous pregnancy.  Ordered anatomy ultrasound at this appointment.  Patient can follow-up in 4 weeks with PCP.  Of note patient has a  history of cesarean section so we will need referral to OB at 32 weeks.  Low back pain Low back pain likely secondary to changes of pregnancy.  Patient she can try Tylenol for the pain.  Also gave her a number of lower back pain stretches which should help.  Arm numbness left Patient with subjective arm numbness from essentially lateral shoulder to fingertips.  Distribution does not follow any particular muscular distribution is limited to her lateral arm.  Not limited to one specific nerve distribution.  Fortunately no loss of strength and does have sensation to light touch intact.  Given this appears to be a sequelae of her pregnancy given her history of the same issue with past pregnancies unlikely to be clinically significant.  Patient can try capsaicin ointment and Tylenol to see if these help.   Orders Placed This Encounter  Procedures  . [place] [facility] OB COMP + 14 WK    Standing Status:   Future    Standing Expiration Date:   10/21/2020    Order Specific Question:   Reason for Exam (SYMPTOM  OR DIAGNOSIS REQUIRED)    Answer:   ob anatomy    Order Specific Question:   Preferred Location    Answer:   [facility]    Order Specific Question:   Release to patient    Answer:  Immediate    No orders of the defined types were placed in this encounter.    Myrene Buddy MD PGY-3 Family Medicine Resident  08/25/2019 3:02 PM

## 2019-08-25 NOTE — Assessment & Plan Note (Signed)
This point patient doing very well with with only issues being low back pain and left arm numbness.  Fortunately no strength deficit or severe sensory deficit.  Reassuring that she has had this problem in a previous pregnancy.  Ordered anatomy ultrasound at this appointment.  Patient can follow-up in 4 weeks with PCP.  Of note patient has a history of cesarean section so we will need referral to OB at 32 weeks.

## 2019-08-25 NOTE — Assessment & Plan Note (Signed)
Patient with subjective arm numbness from essentially lateral shoulder to fingertips.  Distribution does not follow any particular muscular distribution is limited to her lateral arm.  Not limited to one specific nerve distribution.  Fortunately no loss of strength and does have sensation to light touch intact.  Given this appears to be a sequelae of her pregnancy given her history of the same issue with past pregnancies unlikely to be clinically significant.  Patient can try capsaicin ointment and Tylenol to see if these help.

## 2019-08-25 NOTE — Assessment & Plan Note (Signed)
Low back pain likely secondary to changes of pregnancy.  Patient she can try Tylenol for the pain.  Also gave her a number of lower back pain stretches which should help.

## 2019-09-10 ENCOUNTER — Other Ambulatory Visit: Payer: Self-pay

## 2019-09-10 ENCOUNTER — Other Ambulatory Visit (HOSPITAL_COMMUNITY): Payer: Self-pay | Admitting: *Deleted

## 2019-09-10 ENCOUNTER — Other Ambulatory Visit: Payer: Self-pay | Admitting: Family Medicine

## 2019-09-10 ENCOUNTER — Ambulatory Visit (HOSPITAL_COMMUNITY)
Admission: RE | Admit: 2019-09-10 | Discharge: 2019-09-10 | Disposition: A | Discharge status: Home / Self Care | Payer: Medicaid Other | Source: Ambulatory Visit | Attending: Family Medicine | Admitting: Family Medicine

## 2019-09-10 DIAGNOSIS — Z3A21 21 weeks gestation of pregnancy: Secondary | ICD-10-CM | POA: Diagnosis not present

## 2019-09-10 DIAGNOSIS — O99212 Obesity complicating pregnancy, second trimester: Secondary | ICD-10-CM | POA: Diagnosis not present

## 2019-09-10 DIAGNOSIS — O34219 Maternal care for unspecified type scar from previous cesarean delivery: Secondary | ICD-10-CM | POA: Diagnosis not present

## 2019-09-10 DIAGNOSIS — Z3482 Encounter for supervision of other normal pregnancy, second trimester: Secondary | ICD-10-CM

## 2019-09-22 ENCOUNTER — Telehealth: Payer: Self-pay | Admitting: *Deleted

## 2019-09-22 NOTE — Telephone Encounter (Signed)
LVM to call office to go over screening questions prior to visit tomorrow.Cloy Cozzens Zimmerman Rumple, CMA  

## 2019-09-23 ENCOUNTER — Ambulatory Visit (INDEPENDENT_AMBULATORY_CARE_PROVIDER_SITE_OTHER): Payer: Medicaid Other | Admitting: Family Medicine

## 2019-09-23 ENCOUNTER — Other Ambulatory Visit: Payer: Self-pay

## 2019-09-23 DIAGNOSIS — Z3482 Encounter for supervision of other normal pregnancy, second trimester: Secondary | ICD-10-CM

## 2019-09-23 NOTE — Patient Instructions (Addendum)
It was nice seeing you today Chelsea Baldwin!  Try the belly band and back mobility exercises and stretches on YouTube.  Please return in about 4 weeks for your next appointment.  Get some exercise every day and eat plenty of fruits and vegetables, which will also reduce the strain on your back over time.  If you have any questions or concerns, please feel free to call the clinic.   Be well,  Dr. Frances Furbish  Second Trimester of Pregnancy The second trimester is from week 14 through week 27 (months 4 through 6). The second trimester is often a time when you feel your best. Your body has adjusted to being pregnant, and you begin to feel better physically. Usually, morning sickness has lessened or quit completely, you may have more energy, and you may have an increase in appetite. The second trimester is also a time when the fetus is growing rapidly. At the end of the sixth month, the fetus is about 9 inches long and weighs about 1 pounds. You will likely begin to feel the baby move (quickening) between 16 and 20 weeks of pregnancy. Body changes during your second trimester Your body continues to go through many changes during your second trimester. The changes vary from woman to woman.  Your weight will continue to increase. You will notice your lower abdomen bulging out.  You may begin to get stretch marks on your hips, abdomen, and breasts.  You may develop headaches that can be relieved by medicines. The medicines should be approved by your health care provider.  You may urinate more often because the fetus is pressing on your bladder.  You may develop or continue to have heartburn as a result of your pregnancy.  You may develop constipation because certain hormones are causing the muscles that push waste through your intestines to slow down.  You may develop hemorrhoids or swollen, bulging veins (varicose veins).  You may have back pain. This is caused by: ? Weight gain. ? Pregnancy hormones  that are relaxing the joints in your pelvis. ? A shift in weight and the muscles that support your balance.  Your breasts will continue to grow and they will continue to become tender.  Your gums may bleed and may be sensitive to brushing and flossing.  Dark spots or blotches (chloasma, mask of pregnancy) may develop on your face. This will likely fade after the baby is born.  A dark line from your belly button to the pubic area (linea nigra) may appear. This will likely fade after the baby is born.  You may have changes in your hair. These can include thickening of your hair, rapid growth, and changes in texture. Some women also have hair loss during or after pregnancy, or hair that feels dry or thin. Your hair will most likely return to normal after your baby is born. What to expect at prenatal visits During a routine prenatal visit:  You will be weighed to make sure you and the fetus are growing normally.  Your blood pressure will be taken.  Your abdomen will be measured to track your baby's growth.  The fetal heartbeat will be listened to.  Any test results from the previous visit will be discussed. Your health care provider may ask you:  How you are feeling.  If you are feeling the baby move.  If you have had any abnormal symptoms, such as leaking fluid, bleeding, severe headaches, or abdominal cramping.  If you are using any tobacco  products, including cigarettes, chewing tobacco, and electronic cigarettes.  If you have any questions. Other tests that may be performed during your second trimester include:  Blood tests that check for: ? Low iron levels (anemia). ? High blood sugar that affects pregnant women (gestational diabetes) between 13 and 28 weeks. ? Rh antibodies. This is to check for a protein on red blood cells (Rh factor).  Urine tests to check for infections, diabetes, or protein in the urine.  An ultrasound to confirm the proper growth and development of  the baby.  An amniocentesis to check for possible genetic problems.  Fetal screens for spina bifida and Down syndrome.  HIV (human immunodeficiency virus) testing. Routine prenatal testing includes screening for HIV, unless you choose not to have this test. Follow these instructions at home: Medicines  Follow your health care provider's instructions regarding medicine use. Specific medicines may be either safe or unsafe to take during pregnancy.  Take a prenatal vitamin that contains at least 600 micrograms (mcg) of folic acid.  If you develop constipation, try taking a stool softener if your health care provider approves. Eating and drinking   Eat a balanced diet that includes fresh fruits and vegetables, whole grains, good sources of protein such as meat, eggs, or tofu, and low-fat dairy. Your health care provider will help you determine the amount of weight gain that is right for you.  Avoid raw meat and uncooked cheese. These carry germs that can cause birth defects in the baby.  If you have low calcium intake from food, talk to your health care provider about whether you should take a daily calcium supplement.  Limit foods that are high in fat and processed sugars, such as fried and sweet foods.  To prevent constipation: ? Drink enough fluid to keep your urine clear or pale yellow. ? Eat foods that are high in fiber, such as fresh fruits and vegetables, whole grains, and beans. Activity  Exercise only as directed by your health care provider. Most women can continue their usual exercise routine during pregnancy. Try to exercise for 30 minutes at least 5 days a week. Stop exercising if you experience uterine contractions.  Avoid heavy lifting, wear low heel shoes, and practice good posture.  A sexual relationship may be continued unless your health care provider directs you otherwise. Relieving pain and discomfort  Wear a good support bra to prevent discomfort from breast  tenderness.  Take warm sitz baths to soothe any pain or discomfort caused by hemorrhoids. Use hemorrhoid cream if your health care provider approves.  Rest with your legs elevated if you have leg cramps or low back pain.  If you develop varicose veins, wear support hose. Elevate your feet for 15 minutes, 3-4 times a day. Limit salt in your diet. Prenatal Care  Write down your questions. Take them to your prenatal visits.  Keep all your prenatal visits as told by your health care provider. This is important. Safety  Wear your seat belt at all times when driving.  Make a list of emergency phone numbers, including numbers for family, friends, the hospital, and police and fire departments. General instructions  Ask your health care provider for a referral to a local prenatal education class. Begin classes no later than the beginning of month 6 of your pregnancy.  Ask for help if you have counseling or nutritional needs during pregnancy. Your health care provider can offer advice or refer you to specialists for help with various needs.  Do not use hot tubs, steam rooms, or saunas.  Do not douche or use tampons or scented sanitary pads.  Do not cross your legs for long periods of time.  Avoid cat litter boxes and soil used by cats. These carry germs that can cause birth defects in the baby and possibly loss of the fetus by miscarriage or stillbirth.  Avoid all smoking, herbs, alcohol, and unprescribed drugs. Chemicals in these products can affect the formation and growth of the baby.  Do not use any products that contain nicotine or tobacco, such as cigarettes and e-cigarettes. If you need help quitting, ask your health care provider.  Visit your dentist if you have not gone yet during your pregnancy. Use a soft toothbrush to brush your teeth and be gentle when you floss. Contact a health care provider if:  You have dizziness.  You have mild pelvic cramps, pelvic pressure, or  nagging pain in the abdominal area.  You have persistent nausea, vomiting, or diarrhea.  You have a bad smelling vaginal discharge.  You have pain when you urinate. Get help right away if:  You have a fever.  You are leaking fluid from your vagina.  You have spotting or bleeding from your vagina.  You have severe abdominal cramping or pain.  You have rapid weight gain or weight loss.  You have shortness of breath with chest pain.  You notice sudden or extreme swelling of your face, hands, ankles, feet, or legs.  You have not felt your baby move in over an hour.  You have severe headaches that do not go away when you take medicine.  You have vision changes. Summary  The second trimester is from week 14 through week 27 (months 4 through 6). It is also a time when the fetus is growing rapidly.  Your body goes through many changes during pregnancy. The changes vary from woman to woman.  Avoid all smoking, herbs, alcohol, and unprescribed drugs. These chemicals affect the formation and growth your baby.  Do not use any tobacco products, such as cigarettes, chewing tobacco, and e-cigarettes. If you need help quitting, ask your health care provider.  Contact your health care provider if you have any questions. Keep all prenatal visits as told by your health care provider. This is important. This information is not intended to replace advice given to you by your health care provider. Make sure you discuss any questions you have with your health care provider. Document Revised: 11/28/2018 Document Reviewed: 09/11/2016 Elsevier Patient Education  [place].

## 2019-09-23 NOTE — Progress Notes (Signed)
Chelsea Baldwin is a 29 y.o. G3P2002 at [redacted]w[redacted]d here for routine follow up.  She reports pins-and-needles pain in the posterior areas of both of her thighs as well as low back pain.  She says that her numbness and tingling in her left arm has improved since her last visit.  She denies vaginal bleeding, loss of fluid, contractions.  She endorses fetal movement.  She desires TOLAC after previous C-section.  See flow sheet for details.  Fundal height: 25 cm FHT: 152 bpm  A/P: Pregnancy at 110w0d.  Doing well.   Pregnancy issues include obesity, low back pain, history of C-section. Would like to breast-feed, is unsure of contraception but is thinking about Depo. Will need referral to Dr. Shawnie Pons at around 32 weeks due to prior C-section.  Will need repeat 1 hour Glucola for gestational diabetes screening at next visit.  Her previous 1 hour Glucola was to screen for type 2 diabetes and was normal.  Patient encouraged to eat plenty of fruits and vegetables and get activity every day to reduce the risk of increased back pain due to weight gain.  She was also encouraged to look up back mobility exercises on YouTube and to complete these.  Anatomy scan reviewed, problems are not noted.  Scheduled for follow-up for further visualization on 2/19.  Preterm labor precautions reviewed. Follow up 4 weeks.

## 2019-09-28 DIAGNOSIS — L03314 Cellulitis of groin: Secondary | ICD-10-CM | POA: Diagnosis not present

## 2019-09-29 ENCOUNTER — Telehealth: Payer: Self-pay | Admitting: *Deleted

## 2019-09-29 NOTE — Telephone Encounter (Signed)
LVM to call office to go over screening questions prior to visit tomorrow.Jewelz Ricklefs Zimmerman Rumple, CMA  

## 2019-09-30 ENCOUNTER — Other Ambulatory Visit: Payer: Self-pay

## 2019-09-30 ENCOUNTER — Ambulatory Visit (INDEPENDENT_AMBULATORY_CARE_PROVIDER_SITE_OTHER): Payer: Medicaid Other | Admitting: Family Medicine

## 2019-09-30 VITALS — BP 122/80 | HR 108 | Wt 237.4 lb

## 2019-09-30 DIAGNOSIS — Z3482 Encounter for supervision of other normal pregnancy, second trimester: Secondary | ICD-10-CM | POA: Diagnosis present

## 2019-09-30 DIAGNOSIS — L0291 Cutaneous abscess, unspecified: Secondary | ICD-10-CM | POA: Diagnosis not present

## 2019-09-30 LAB — POCT 1 HR PRENATAL GLUCOSE: Glucose 1 Hr Prenatal, POC: 151 mg/dL

## 2019-09-30 MED ORDER — AMOXICILLIN-POT CLAVULANATE 875-125 MG PO TABS: 1.0000 | ORAL_TABLET | Freq: Two times a day (BID) | ORAL | 0 refills | Status: DC

## 2019-09-30 NOTE — Assessment & Plan Note (Signed)
1 hour Glucola obtained today and was elevated.  Advised to schedule 3hr Glucola this week.  This has been set up by Molly Maduro in the lab.

## 2019-09-30 NOTE — Assessment & Plan Note (Signed)
Area of abscess outside of labia majora.  Currently draining purulent drainage.  Was able to collect drainage to send for culture.  Given purulent drainage will plan to treat with antibiotics.  High concern for anaerobic given area.  Low suspicion for MRSA.  Given pregnancy will treat with Augmentin which is pregnancy category B.  Have discussed this with Dr. Leveda Anna as well.  Will treat for 7 days.  Advised to follow-up in 1 to 2 weeks so we may ensure this is improving.

## 2019-09-30 NOTE — Progress Notes (Addendum)
   CHIEF COMPLAINT / HPI:  Abscess around labia Patient presenting today for concerns of abscess around labia.  States there are "2 spots" outside of her right labia majora.  States that they started to come up on Saturday.  Has been using warm compresses 1-2 times a day for 15 to 20 minutes each.  States that yesterday one of the spots "popped" and has been draining purulent drainage.  States that pain improved after drainage.  Drainage was pustular with some bloody tinge.  States that she usually gets these under her arms but has never been in her labia before.  Denies shaving the area recently.  Denies any fevers.  Reports odor to the drainage.  Does report some dizziness.  PERTINENT  PMH / PSH: Current pregnancy, THC abuse, obesity   OBJECTIVE: BP 122/80   Pulse (!) 108   Wt 237 lb 6.4 oz (107.7 kg)   LMP 04/08/2019   BMI 42.05 kg/m   General: Well-appearing, no apparent distress GU: Normal external genitalia, area of abscess outside of right labia majora.  Area is actively draining.  Purulent discharge with odor. Chaperone present  ASSESSMENT / PLAN:  Abscess Area of abscess outside of labia majora.  Currently draining purulent drainage.  Was able to collect drainage to send for culture.  Given purulent drainage will plan to treat with antibiotics.  High concern for anaerobic given area.  Low suspicion for MRSA.  Given pregnancy will treat with Augmentin which is pregnancy category B.  Have discussed this with Dr. Leveda Anna as well.  Will treat for 7 days.  Advised to follow-up in 1 to 2 weeks so we may ensure this is improving.  Supervision of normal pregnancy 1 hour Glucola obtained today and was elevated.  Advised to schedule 3hr Glucola this week.  This has been set up by Molly Maduro in the lab.     Oralia Manis, DO  PGY-3 [facility]

## 2019-09-30 NOTE — Patient Instructions (Signed)
It was a pleasure seeing you today.   Today we discussed your abscess  For your abscess: please use augmentin twice a day for 7 days. Use warm compresses daily as well. Follow up in 2 weeks so we can ensure improvement   Please follow up in 2 weeks or sooner if symptoms persist or worsen. Please call the clinic immediately if you have any concerns.   Our clinic's number is [phone]. Please call with questions or concerns.   If you have any leakage of fluid, decreased fetal movement, discharge, vaginal bleeding, or significant pain please go to the MAU   Thank you,  Oralia Manis, DO

## 2019-10-01 ENCOUNTER — Other Ambulatory Visit (INDEPENDENT_AMBULATORY_CARE_PROVIDER_SITE_OTHER): Payer: Medicaid Other

## 2019-10-01 DIAGNOSIS — Z3482 Encounter for supervision of other normal pregnancy, second trimester: Secondary | ICD-10-CM | POA: Diagnosis not present

## 2019-10-01 LAB — POCT CBG (FASTING - GLUCOSE)-MANUAL ENTRY: Glucose Fasting, POC: 86 mg/dL (ref 70–99)

## 2019-10-02 LAB — GESTATIONAL GLUCOSE TOLERANCE
Glucose, Fasting: 77 mg/dL (ref 65–94)
Glucose, GTT - 1 Hour: 167 mg/dL (ref 65–179)
Glucose, GTT - 2 Hour: 122 mg/dL (ref 65–154)
Glucose, GTT - 3 Hour: 88 mg/dL (ref 65–139)

## 2019-10-03 LAB — WOUND CULTURE

## 2019-10-05 ENCOUNTER — Other Ambulatory Visit: Payer: Self-pay | Admitting: Family Medicine

## 2019-10-05 MED ORDER — CLINDAMYCIN HCL 300 MG PO CAPS: 300.0000 mg | ORAL_CAPSULE | Freq: Four times a day (QID) | ORAL | 0 refills | Status: AC

## 2019-10-05 NOTE — Progress Notes (Signed)
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  Is sensitive to clindamycin.  This is also preferred as patient is pregnant.  Unable to use Bactrim or doxycycline.  I discussed this with patient and advised her to stop using Augmentin as the bacteria will be resistant to this.  Patient reports that the area of abscess is still present.  Would like a new course of antibiotics.  Have given her clindamycin 300 mg 4 times daily x5 days.  Follow-up if no improvement.  Discussed with Dr. Albin Felling, DO, PGY-3 [facility] 10/05/2019 8:44 AM

## 2019-10-09 ENCOUNTER — Ambulatory Visit (HOSPITAL_COMMUNITY): Payer: Medicaid Other

## 2019-10-13 ENCOUNTER — Encounter (HOSPITAL_COMMUNITY): Payer: Self-pay

## 2019-10-13 ENCOUNTER — Ambulatory Visit (HOSPITAL_COMMUNITY): Payer: Medicaid Other

## 2019-10-14 ENCOUNTER — Ambulatory Visit (INDEPENDENT_AMBULATORY_CARE_PROVIDER_SITE_OTHER): Payer: Medicaid Other | Admitting: Family Medicine

## 2019-10-14 ENCOUNTER — Other Ambulatory Visit: Payer: Self-pay

## 2019-10-14 DIAGNOSIS — Z3482 Encounter for supervision of other normal pregnancy, second trimester: Secondary | ICD-10-CM

## 2019-10-14 NOTE — Patient Instructions (Addendum)
It was nice seeing you today Chelsea Baldwin!  I am glad that you are feeling well.  At your next visit, we will give you your Tdap shot and get some labs.  Please return in about 4 weeks.  If you have any questions or concerns, please feel free to call the clinic.   Be well,  Dr. Frances Furbish

## 2019-10-14 NOTE — Progress Notes (Signed)
Chelsea Baldwin is a 29 y.o. G3P2002 at [redacted]w[redacted]d here for routine follow up.  She reports no vaginal bleeding, no contractions, no gush of fluid, and endorses fetal movement. See flow sheet for details.  The abscess on her groin has healed well since her last visit.  She is continuing to research contraception options.  Fundal Height: 27 cm  FHT: 148 bpm  A/P: Pregnancy at [redacted]w[redacted]d.  Doing well.   Pregnancy issues include failed one hour glucola screen, although she passed her 3-hour GTT. Will need Tdap, CBC, RPR, and HIV testing at next visit. Preterm labor and fetal movement precautions reviewed. Follow up 4 weeks.

## 2019-10-20 ENCOUNTER — Other Ambulatory Visit: Payer: Self-pay | Admitting: Family Medicine

## 2019-10-20 ENCOUNTER — Other Ambulatory Visit: Payer: Self-pay

## 2019-10-20 ENCOUNTER — Other Ambulatory Visit (HOSPITAL_COMMUNITY): Payer: Self-pay | Admitting: *Deleted

## 2019-10-20 ENCOUNTER — Ambulatory Visit (HOSPITAL_COMMUNITY)
Admission: RE | Admit: 2019-10-20 | Discharge: 2019-10-20 | Disposition: A | Discharge status: Home / Self Care | Payer: Medicaid Other | Source: Ambulatory Visit | Attending: Obstetrics | Admitting: Obstetrics

## 2019-10-20 DIAGNOSIS — O99213 Obesity complicating pregnancy, third trimester: Secondary | ICD-10-CM

## 2019-10-20 DIAGNOSIS — Z3A26 26 weeks gestation of pregnancy: Secondary | ICD-10-CM | POA: Diagnosis not present

## 2019-10-20 DIAGNOSIS — O99212 Obesity complicating pregnancy, second trimester: Secondary | ICD-10-CM | POA: Insufficient documentation

## 2019-10-20 DIAGNOSIS — Z362 Encounter for other antenatal screening follow-up: Secondary | ICD-10-CM | POA: Diagnosis not present

## 2019-10-20 DIAGNOSIS — O34219 Maternal care for unspecified type scar from previous cesarean delivery: Secondary | ICD-10-CM | POA: Diagnosis not present

## 2019-11-16 ENCOUNTER — Encounter: Payer: Medicaid Other | Admitting: Family Medicine

## 2019-12-01 ENCOUNTER — Other Ambulatory Visit: Payer: Self-pay

## 2019-12-01 ENCOUNTER — Other Ambulatory Visit (HOSPITAL_COMMUNITY): Payer: Self-pay | Admitting: *Deleted

## 2019-12-01 ENCOUNTER — Ambulatory Visit (HOSPITAL_COMMUNITY)
Admission: RE | Admit: 2019-12-01 | Discharge: 2019-12-01 | Disposition: A | Discharge status: Home / Self Care | Payer: Medicaid Other | Source: Ambulatory Visit | Attending: Obstetrics and Gynecology | Admitting: Obstetrics and Gynecology

## 2019-12-01 DIAGNOSIS — E669 Obesity, unspecified: Secondary | ICD-10-CM

## 2019-12-01 DIAGNOSIS — Z3A32 32 weeks gestation of pregnancy: Secondary | ICD-10-CM

## 2019-12-01 DIAGNOSIS — O99210 Obesity complicating pregnancy, unspecified trimester: Secondary | ICD-10-CM

## 2019-12-01 DIAGNOSIS — O99213 Obesity complicating pregnancy, third trimester: Secondary | ICD-10-CM

## 2019-12-03 ENCOUNTER — Ambulatory Visit (INDEPENDENT_AMBULATORY_CARE_PROVIDER_SITE_OTHER): Payer: Medicaid Other | Admitting: Family Medicine

## 2019-12-03 ENCOUNTER — Other Ambulatory Visit: Payer: Self-pay

## 2019-12-03 VITALS — BP 98/58 | HR 88 | Wt 249.8 lb

## 2019-12-03 DIAGNOSIS — Z3483 Encounter for supervision of other normal pregnancy, third trimester: Secondary | ICD-10-CM | POA: Diagnosis not present

## 2019-12-03 DIAGNOSIS — Z23 Encounter for immunization: Secondary | ICD-10-CM | POA: Diagnosis not present

## 2019-12-03 NOTE — Patient Instructions (Addendum)
It was nice seeing you today Ms. Chelsea Baldwin!  You were given your Tdap vaccine, and we checked some labs today.  I will be in touch if any of these labs are abnormal, and I will send the results on MyChart.  Use Tylenol for control of your pubic symphysis pain, and please let us know if your pain becomes concerning.  Please return in about 2 weeks.  If you have any questions or concerns, please feel free to call the clinic.   Be well,  Dr. Frances Furbish

## 2019-12-03 NOTE — Progress Notes (Signed)
Chelsea Baldwin is a 29 y.o. G3P2002 at [redacted]w[redacted]d here for routine follow up.  She reports pain along her pubic bone that is worse with sitting and sexual activity.  She denies vaginal bleeding, loss of vaginal fluid, cramping, contractions, and endorses fetal movement. See flow sheet for details.  Fundal height: 34 inches FHT: 144 bpm Musculoskeletal: Mildly tender along the pubic symphysis, no displacement or instability noted  A/P: Pregnancy at [redacted]w[redacted]d.  Doing well.   Pregnancy issues include obesity, pubic symphysis pain.  Infant feeding choice: Exclusive breast-feeding Contraception choice: Condoms -counseling provided Infant circumcision desired: yes  Tdap was given today. CBC, RPR, and HIV obtained today. Will need ultrasound for presentation performed at next visit.  Preterm labor and fetal movement precautions reviewed. Safe sleep discussed. Follow up 2 weeks.

## 2019-12-04 LAB — CBC
Hematocrit: 30.2 % — ABNORMAL LOW (ref 34.0–46.6)
Hemoglobin: 10.7 g/dL — ABNORMAL LOW (ref 11.1–15.9)
MCH: 29.2 pg (ref 26.6–33.0)
MCHC: 35.4 g/dL (ref 31.5–35.7)
MCV: 83 fL (ref 79–97)
Platelets: 269 x10E3/uL (ref 150–450)
RBC: 3.66 x10E6/uL — ABNORMAL LOW (ref 3.77–5.28)
RDW: 13.7 % (ref 11.7–15.4)
WBC: 10.6 x10E3/uL (ref 3.4–10.8)

## 2019-12-04 LAB — RPR: RPR Ser Ql: NONREACTIVE

## 2019-12-04 LAB — HIV ANTIBODY (ROUTINE TESTING W REFLEX): HIV Screen 4th Generation wRfx: NONREACTIVE

## 2019-12-09 ENCOUNTER — Telehealth: Payer: Self-pay | Admitting: Family Medicine

## 2019-12-09 ENCOUNTER — Other Ambulatory Visit: Payer: Self-pay | Admitting: Family Medicine

## 2019-12-09 DIAGNOSIS — Z3483 Encounter for supervision of other normal pregnancy, third trimester: Secondary | ICD-10-CM

## 2019-12-09 NOTE — Telephone Encounter (Signed)
Due to patient's previous C-section, she will need to have an appointment with OB/GYN to discuss TOLAC.  She was informed of this, the referral was made, and she was given the number for the [place] office.  All questions were answered.

## 2019-12-16 ENCOUNTER — Ambulatory Visit (INDEPENDENT_AMBULATORY_CARE_PROVIDER_SITE_OTHER): Payer: Medicaid Other | Admitting: Family Medicine

## 2019-12-16 ENCOUNTER — Other Ambulatory Visit: Payer: Self-pay

## 2019-12-16 VITALS — BP 102/60 | HR 83 | Wt 246.0 lb

## 2019-12-16 DIAGNOSIS — Z3483 Encounter for supervision of other normal pregnancy, third trimester: Secondary | ICD-10-CM

## 2019-12-16 NOTE — Progress Notes (Deleted)
Chelsea Baldwin is a 29 y.o. G3P2002 at [redacted]w[redacted]d here for routine follow up.  She reports ***. See flow sheet for details.   A/P: Pregnancy at [redacted]w[redacted]d.  Doing well.   Pregnancy issues include ***.  Infant feeding choice: breast feeding Contraception choice: is considering, depo Infant circumcision desired: no  Tdap was not given today.  Was given previously. GBS and gc/chlamydia testing was not performed today.  Preterm labor and fetal movement precautions reviewed. Safe sleep discussed. Follow up 1-2 week(s).

## 2019-12-16 NOTE — Progress Notes (Addendum)
Chelsea Baldwin is a 29 y.o. G3P2002 at [redacted]w[redacted]d here for routine follow up.  She reports no vaginal bleeding or loss of vaginal fluid, no contractions, endorses fetal movement. See flow sheet for details.  Fundal height: 35 cm FHT: 142 bpm  A/P: Pregnancy at [redacted]w[redacted]d.  Doing well.   Pregnancy issues include obesity, prior C-section.  Infant feeding choice: breast feeding Contraception choice: condoms, possibly depo provera Infant circumcision desired: yes  Tdap was not given today as it was given at her last visit. GBS and gc/chlamydia testing was not performed today, will need to be performed after 36 weeks. [place] on 4/13 showed cephalic presentation. Has an appointment with OB for discussion regarding TOLAC on 5/14. Will need in-office US showing cephalic presentation at next visit.  Preterm labor and fetal movement precautions reviewed. Safe sleep discussed. Follow up 1-2 week(s).

## 2019-12-20 NOTE — Progress Notes (Signed)
  Patient Name: Margaretmary Eddy Date of Birth: 10-09-1990 Date of Visit: 12/21/19 PCP: Lennox Solders, MD Third Trimester Prenatal Visit   Chief Complaint: prenatal care  Subjective: Nera Wyer is a pleasant G3P2002 at  [redacted]w[redacted]d dated by 1st trimester [place].She has no unusual complaints today.  She denies vaginal bleeding or discharge. She reports good fetal movement. She denies contractions.    ROS: per HPI.   I have reviewed the patient's medical, surgical, family, and social history as appropriate.  Labs reviewed: third trimester labs showing RPR NR, HIV NR, Hgb 10.7, elevated 1hr glucola but 3hr wnl [place] reviewed: @[redacted]w[redacted]d , CWD, normal anatomy, cephalic presentation, posterior placental lie, 2110g, 47% EFW. F/u scan scheduled for 4 weeks  H/o C-section Has an appointment with OB for discussion regarding TOLAC on 5/14.  Supervision of low risk pregnancy . Dating reviewed, dating tab is correct . Fetal heart tones: Appropriate . Fundal height: within expected range.  . Pregnancy issues include see above. . Problem list updated: Yes . The patient does not have a history of HSV and valacyclovir is not at this time.  . The patient has a history of Cesarean delivery and consult to [facility] for discussion, signing of consents, and scheduling delivery.   . Infant feeding choice: Breastfeeding . Contraception choice: condoms  . Infant circumcision desired yes  . Influenza vaccine previously administered.   . Tdap was not given today. Previously administered   . Childbirth and education classes were offered. . Pregnancy education regarding benefits of breastfeeding, contraception, fetal growth, expected weight gain, and safe infant sleep were discussed.  . Preterm labor and fetal movement precautions reviewed. . Scheduled for Ob Faculty clinic in third trimester on 01/14/20.    Vitals:   12/21/19 1616  BP: 118/70  Pulse: 90   Last Weight  Most recent  update: 12/21/2019  4:17 PM   Weight  113.9 kg (251 lb 3.2 oz)           See flow sheet for exam.  Fetal heart tones documented in flow sheet  Fundal height documented in flow sheet   Wiley Honeycutt is a pleasant G3P2002 at [redacted]w[redacted]d presenting for routine third trimester prenatal care. Overall she is doing well.   Anticipatory Guidance and Prenatal Education provided on the following topics: - Recommended continuing Prenatal vitamin  - Discussed options for Contraception postpartum - Discussed mode of delivery - Discussed pain control in labor - Discussed  breastfeeding and benefits for infant  - Discussed safe sleep recommendations and car seat recommendations  - Problem list and prenatal box updated   Follow up in 2 weeks until 36 weeks then weekly from 36 weeks until delivered.

## 2019-12-21 ENCOUNTER — Encounter: Payer: Self-pay | Admitting: Family Medicine

## 2019-12-21 ENCOUNTER — Other Ambulatory Visit: Payer: Self-pay

## 2019-12-21 ENCOUNTER — Ambulatory Visit (INDEPENDENT_AMBULATORY_CARE_PROVIDER_SITE_OTHER): Payer: Medicaid Other | Admitting: Family Medicine

## 2019-12-21 DIAGNOSIS — Z3483 Encounter for supervision of other normal pregnancy, third trimester: Secondary | ICD-10-CM

## 2019-12-21 NOTE — Patient Instructions (Signed)

## 2019-12-29 ENCOUNTER — Ambulatory Visit (HOSPITAL_COMMUNITY): Payer: Medicaid Other | Attending: Obstetrics and Gynecology

## 2019-12-29 ENCOUNTER — Other Ambulatory Visit (HOSPITAL_COMMUNITY)
Admission: RE | Admit: 2019-12-29 | Discharge: 2019-12-29 | Disposition: A | Discharge status: Home / Self Care | Payer: Medicaid Other | Source: Ambulatory Visit | Attending: Family Medicine | Admitting: Family Medicine

## 2019-12-29 ENCOUNTER — Other Ambulatory Visit: Payer: Self-pay

## 2019-12-29 ENCOUNTER — Ambulatory Visit (INDEPENDENT_AMBULATORY_CARE_PROVIDER_SITE_OTHER): Payer: Medicaid Other | Admitting: Family Medicine

## 2019-12-29 ENCOUNTER — Ambulatory Visit: Payer: Medicaid Other | Admitting: *Deleted

## 2019-12-29 VITALS — BP 108/60 | HR 80 | Wt 249.2 lb

## 2019-12-29 VITALS — BP 110/80 | HR 78 | Temp 97.4°F

## 2019-12-29 DIAGNOSIS — O0990 Supervision of high risk pregnancy, unspecified, unspecified trimester: Secondary | ICD-10-CM

## 2019-12-29 DIAGNOSIS — Z3483 Encounter for supervision of other normal pregnancy, third trimester: Secondary | ICD-10-CM | POA: Insufficient documentation

## 2019-12-29 DIAGNOSIS — Z362 Encounter for other antenatal screening follow-up: Secondary | ICD-10-CM | POA: Diagnosis not present

## 2019-12-29 DIAGNOSIS — Z3A36 36 weeks gestation of pregnancy: Secondary | ICD-10-CM

## 2019-12-29 DIAGNOSIS — O99210 Obesity complicating pregnancy, unspecified trimester: Secondary | ICD-10-CM

## 2019-12-29 DIAGNOSIS — O34219 Maternal care for unspecified type scar from previous cesarean delivery: Secondary | ICD-10-CM

## 2019-12-29 DIAGNOSIS — O99213 Obesity complicating pregnancy, third trimester: Secondary | ICD-10-CM | POA: Diagnosis not present

## 2019-12-29 DIAGNOSIS — O358XX0 Maternal care for other (suspected) fetal abnormality and damage, not applicable or unspecified: Secondary | ICD-10-CM

## 2019-12-29 LAB — OB RESULTS CONSOLE GC/CHLAMYDIA: Gonorrhea: NEGATIVE

## 2019-12-29 MED ORDER — PRENATAL FORMULA 28-0.8-235 MG PO CAPS: 1.0000 | ORAL_CAPSULE | Freq: Every day | ORAL | Status: DC

## 2019-12-29 NOTE — Progress Notes (Signed)
  [facility] Prenatal Visit  Araly Kaas is a 29 y.o. G3P2002 at [redacted]w[redacted]d here for routine follow up. She is dated by early ultrasound.  She reports contractions since Sunday and Monday but not currently, nausea, no bleeding, no leaking and occasional contractions. She reports fetal movement. She also reports losing her mucus plug this pas Sunday with irregular contractions yesterday from 3am-6am. She denies vaginal bleeding, contractions, or loss of fluid. See flow sheet for details.  Vitals:   12/29/19 0841  BP: 108/60  Pulse: 80   General: Appears well, no acute distress. Age appropriate. Cardiac: RRR, normal heart sounds, no murmurs Respiratory: CTAB, normal effort Abdomen: soft, nontender, gravid Extremities: No edema or cyanosis. Psych: normal affect  A/P: Pregnancy at [redacted]w[redacted]d.  Doing well.   . Dating reviewed, dating tab is correct . Fetal heart tones Appropriate . Fundal height within expected range.  . Fetal position confirmed Vertex using Leopold's . Has ultrasound today. . Pregnancy issues include H/o c-section; desires TOLAC. Meeting with [facility] faculty practice 5/14 to discuss. H/o GBS+ in last pregnancy . Problem list updated: No remains to be correct . GBS collected today.  Will still needed to be treated in labor.  . Repeat GC/CT collected today.  . The patient does not have a history of HSV and valacyclovir is not at this time.   . Infant feeding choice: Breastfeeding . Contraception choice: Condoms . Infant circumcision desired yes  . Influenza vaccine previously administered.   . Tdap previously administered between 27-36 weeks   Pregnancy education regarding preterm labor, fetal movement,  benefits of breastfeeding, contraception, fetal growth, expected weight gain, and safe infant sleep were discussed.  Follow up 1 week.

## 2019-12-29 NOTE — Patient Instructions (Signed)
Third Trimester of Pregnancy The third trimester is from week 28 through week 40 (months 7 through 9). The third trimester is a time when the unborn baby (fetus) is growing rapidly. At the end of the ninth month, the fetus is about 20 inches in length and weighs 6-10 pounds. Body changes during your third trimester Your body will continue to go through many changes during pregnancy. The changes vary from woman to woman. During the third trimester:  Your weight will continue to increase. You can expect to gain 25-35 pounds (11-16 kg) by the end of the pregnancy.  You may begin to get stretch marks on your hips, abdomen, and breasts.  You may urinate more often because the fetus is moving lower into your pelvis and pressing on your bladder.  You may develop or continue to have heartburn. This is caused by increased hormones that slow down muscles in the digestive tract.  You may develop or continue to have constipation because increased hormones slow digestion and cause the muscles that push waste through your intestines to relax.  You may develop hemorrhoids. These are swollen veins (varicose veins) in the rectum that can itch or be painful.  You may develop swollen, bulging veins (varicose veins) in your legs.  You may have increased body aches in the pelvis, back, or thighs. This is due to weight gain and increased hormones that are relaxing your joints.  You may have changes in your hair. These can include thickening of your hair, rapid growth, and changes in texture. Some women also have hair loss during or after pregnancy, or hair that feels dry or thin. Your hair will most likely return to normal after your baby is born.  Your breasts will continue to grow and they will continue to become tender. A yellow fluid (colostrum) may leak from your breasts. This is the first milk you are producing for your baby.  Your belly button may stick out.  You may notice more swelling in your hands,  face, or ankles.  You may have increased tingling or numbness in your hands, arms, and legs. The skin on your belly may also feel numb.  You may feel Anger of breath because of your expanding uterus.  You may have more problems sleeping. This can be caused by the size of your belly, increased need to urinate, and an increase in your body's metabolism.  You may notice the fetus "dropping," or moving lower in your abdomen (lightening).  You may have increased vaginal discharge.  You may notice your joints feel loose and you may have pain around your pelvic bone. What to expect at prenatal visits You will have prenatal exams every 2 weeks until week 36. Then you will have weekly prenatal exams. During a routine prenatal visit:  You will be weighed to make sure you and the baby are growing normally.  Your blood pressure will be taken.  Your abdomen will be measured to track your baby's growth.  The fetal heartbeat will be listened to.  Any test results from the previous visit will be discussed.  You may have a cervical check near your due date to see if your cervix has softened or thinned (effaced).  You will be tested for Group B streptococcus. This happens between 35 and 37 weeks. Your health care provider may ask you:  What your birth plan is.  How you are feeling.  If you are feeling the baby move.  If you have had any abnormal   symptoms, such as leaking fluid, bleeding, severe headaches, or abdominal cramping.  If you are using any tobacco products, including cigarettes, chewing tobacco, and electronic cigarettes.  If you have any questions. Other tests or screenings that may be performed during your third trimester include:  Blood tests that check for low iron levels (anemia).  Fetal testing to check the health, activity level, and growth of the fetus. Testing is done if you have certain medical conditions or if there are problems during the pregnancy.  Nonstress test  (NST). This test checks the health of your baby to make sure there are no signs of problems, such as the baby not getting enough oxygen. During this test, a belt is placed around your belly. The baby is made to move, and its heart rate is monitored during movement. What is false labor? False labor is a condition in which you feel small, irregular tightenings of the muscles in the womb (contractions) that usually go away with rest, changing position, or drinking water. These are called Braxton Hicks contractions. Contractions may last for hours, days, or even weeks before true labor sets in. If contractions come at regular intervals, become more frequent, increase in intensity, or become painful, you should see your health care provider. What are the signs of labor?  Abdominal cramps.  Regular contractions that start at 10 minutes apart and become stronger and more frequent with time.  Contractions that start on the top of the uterus and spread down to the lower abdomen and back.  Increased pelvic pressure and dull back pain.  A watery or bloody mucus discharge that comes from the vagina.  Leaking of amniotic fluid. This is also known as your "water breaking." It could be a slow trickle or a gush. Let your health care provider know if it has a color or strange odor. If you have any of these signs, call your health care provider right away, even if it is before your due date. Follow these instructions at home: Medicines  Follow your health care provider's instructions regarding medicine use. Specific medicines may be either safe or unsafe to take during pregnancy.  Take a prenatal vitamin that contains at least 600 micrograms (mcg) of folic acid.  If you develop constipation, try taking a stool softener if your health care provider approves. Eating and drinking   Eat a balanced diet that includes fresh fruits and vegetables, whole grains, good sources of protein such as meat, eggs, or tofu,  and low-fat dairy. Your health care provider will help you determine the amount of weight gain that is right for you.  Avoid raw meat and uncooked cheese. These carry germs that can cause birth defects in the baby.  If you have low calcium intake from food, talk to your health care provider about whether you should take a daily calcium supplement.  Eat four or five small meals rather than three large meals a day.  Limit foods that are high in fat and processed sugars, such as fried and sweet foods.  To prevent constipation: ? Drink enough fluid to keep your urine clear or pale yellow. ? Eat foods that are high in fiber, such as fresh fruits and vegetables, whole grains, and beans. Activity  Exercise only as directed by your health care provider. Most women can continue their usual exercise routine during pregnancy. Try to exercise for 30 minutes at least 5 days a week. Stop exercising if you experience uterine contractions.  Avoid heavy lifting.  Do   not exercise in extreme heat or humidity, or at high altitudes.  Wear low-heel, comfortable shoes.  Practice good posture.  You may continue to have sex unless your health care provider tells you otherwise. Relieving pain and discomfort  Take frequent breaks and rest with your legs elevated if you have leg cramps or low back pain.  Take warm sitz baths to soothe any pain or discomfort caused by hemorrhoids. Use hemorrhoid cream if your health care provider approves.  Wear a good support bra to prevent discomfort from breast tenderness.  If you develop varicose veins: ? Wear support pantyhose or compression stockings as told by your healthcare provider. ? Elevate your feet for 15 minutes, 3-4 times a day. Prenatal care  Write down your questions. Take them to your prenatal visits.  Keep all your prenatal visits as told by your health care provider. This is important. Safety  Wear your seat belt at all times when driving.  Make  a list of emergency phone numbers, including numbers for family, friends, the hospital, and police and fire departments. General instructions  Avoid cat litter boxes and soil used by cats. These carry germs that can cause birth defects in the baby. If you have a cat, ask someone to clean the litter box for you.  Do not travel far distances unless it is absolutely necessary and only with the approval of your health care provider.  Do not use hot tubs, steam rooms, or saunas.  Do not drink alcohol.  Do not use any products that contain nicotine or tobacco, such as cigarettes and e-cigarettes. If you need help quitting, ask your health care provider.  Do not use any medicinal herbs or unprescribed drugs. These chemicals affect the formation and growth of the baby.  Do not douche or use tampons or scented sanitary pads.  Do not cross your legs for long periods of time.  To prepare for the arrival of your baby: ? Take prenatal classes to understand, practice, and ask questions about labor and delivery. ? Make a trial run to the hospital. ? Visit the hospital and tour the maternity area. ? Arrange for maternity or paternity leave through employers. ? Arrange for family and friends to take care of pets while you are in the hospital. ? Purchase a rear-facing car seat and make sure you know how to install it in your car. ? Pack your hospital bag. ? Prepare the baby's nursery. Make sure to remove all pillows and stuffed animals from the baby's crib to prevent suffocation.  Visit your dentist if you have not gone during your pregnancy. Use a soft toothbrush to brush your teeth and be gentle when you floss. Contact a health care provider if:  You are unsure if you are in labor or if your water has broken.  You become dizzy.  You have mild pelvic cramps, pelvic pressure, or nagging pain in your abdominal area.  You have lower back pain.  You have persistent nausea, vomiting, or  diarrhea.  You have an unusual or bad smelling vaginal discharge.  You have pain when you urinate. Get help right away if:  Your water breaks before 37 weeks.  You have regular contractions less than 5 minutes apart before 37 weeks.  You have a fever.  You are leaking fluid from your vagina.  You have spotting or bleeding from your vagina.  You have severe abdominal pain or cramping.  You have rapid weight loss or weight gain.  You have   shortness of breath with chest pain.  You notice sudden or extreme swelling of your face, hands, ankles, feet, or legs.  Your baby makes fewer than 10 movements in 2 hours.  You have severe headaches that do not go away when you take medicine.  You have vision changes. Summary  The third trimester is from week 28 through week 40, months 7 through 9. The third trimester is a time when the unborn baby (fetus) is growing rapidly.  During the third trimester, your discomfort may increase as you and your baby continue to gain weight. You may have abdominal, leg, and back pain, sleeping problems, and an increased need to urinate.  During the third trimester your breasts will keep growing and they will continue to become tender. A yellow fluid (colostrum) may leak from your breasts. This is the first milk you are producing for your baby.  False labor is a condition in which you feel small, irregular tightenings of the muscles in the womb (contractions) that eventually go away. These are called Braxton Hicks contractions. Contractions may last for hours, days, or even weeks before true labor sets in.  Signs of labor can include: abdominal cramps; regular contractions that start at 10 minutes apart and become stronger and more frequent with time; watery or bloody mucus discharge that comes from the vagina; increased pelvic pressure and dull back pain; and leaking of amniotic fluid. This information is not intended to replace advice given to you by your  health care provider. Make sure you discuss any questions you have with your health care provider. Document Revised: 11/27/2018 Document Reviewed: 09/11/2016 [employer] Patient Education  2020 [employer]  

## 2019-12-30 LAB — CERVICOVAGINAL ANCILLARY ONLY
Chlamydia: NEGATIVE
Comment: NEGATIVE
Comment: NORMAL
Neisseria Gonorrhea: NEGATIVE

## 2020-01-01 ENCOUNTER — Other Ambulatory Visit: Payer: Self-pay

## 2020-01-01 ENCOUNTER — Encounter: Payer: Self-pay | Admitting: Obstetrics & Gynecology

## 2020-01-01 ENCOUNTER — Ambulatory Visit (INDEPENDENT_AMBULATORY_CARE_PROVIDER_SITE_OTHER): Payer: Medicaid Other | Admitting: Obstetrics & Gynecology

## 2020-01-01 DIAGNOSIS — Z3483 Encounter for supervision of other normal pregnancy, third trimester: Secondary | ICD-10-CM

## 2020-01-01 DIAGNOSIS — O34219 Maternal care for unspecified type scar from previous cesarean delivery: Secondary | ICD-10-CM | POA: Insufficient documentation

## 2020-01-01 DIAGNOSIS — Z3A37 37 weeks gestation of pregnancy: Secondary | ICD-10-CM

## 2020-01-01 NOTE — Progress Notes (Signed)
   PRENATAL VISIT NOTE  Subjective:  Chelsea Baldwin is a 29 y.o. G3P2002 at [redacted]w[redacted]d being seen today for consultation from [facility] about delivery plan.  History of previous cesarean section at term due to fetal bradycardia, was not told she had contraindications to subsequent vaginal delivery.  She is currently monitored for the following issues for this low-risk pregnancy and has Supervision of normal pregnancy; Mild tetrahydrocannabinol (THC) abuse; Obesity affecting pregnancy; Low back pain; Arm numbness left; Abscess; and Previous cesarean delivery, antepartum on their problem list.  Patient reports no complaints.  Contractions: Irregular. Vag. Bleeding: Bloody Show.  Movement: Present. Denies leaking of fluid.   The following portions of the patient's history were reviewed and updated as appropriate: allergies, current medications, past family history, past medical history, past social history, past surgical history and problem list.   Objective:   Vitals:   01/01/20 0855  BP: (!) 99/57  Pulse: 87  Weight: 245 lb 14.4 oz (111.5 kg)    Fetal Status:     Movement: Present     General:  Alert, oriented and cooperative. Patient is in no acute distress.  Skin: Skin is warm and dry. No rash noted.   Cardiovascular: Normal heart rate noted  Respiratory: Normal respiratory effort, no problems with respiration noted  Abdomen: Soft, gravid, appropriate for gestational age.  Pain/Pressure: Present     Pelvic: Cervical exam deferred        Extremities: Normal range of motion.  Edema: None  Mental Status: Normal mood and affect. Normal behavior. Normal judgment and thought content.   Assessment and Plan:  Pregnancy: G3P2002 at [redacted]w[redacted]d 1. Previous cesarean delivery, antepartum 2. Encounter for supervision of other normal pregnancy in third trimester Counseled regarding TOLAC vs RCS; risks/benefits discussed in detail (please refer to scanned in consent form). All questions answered.  Patient  elects for TOLAC, consent signed 01/01/2020.  Preterm labor symptoms and general obstetric precautions including but not limited to vaginal bleeding, contractions, leaking of fluid and fetal movement were reviewed in detail with the patient. Please refer to After Visit Summary for other counseling recommendations.   Return for OB visits as scheduled at [facility].  Future Appointments  Date Time Provider Department Center  01/04/2020  8:45 AM Lavonda Jumbo, DO [facility]  01/14/2020 11:00 AM [facility]    Jaynie Collins, MD

## 2020-01-01 NOTE — Patient Instructions (Signed)
Vaginal Birth After Cesarean Delivery  Vaginal birth after cesarean delivery (VBAC) is giving birth vaginally after previously delivering a baby through a cesarean section (C-section). A VBAC may be a safe option for you, depending on your health and other factors. It is important to discuss VBAC with your health care provider early in your pregnancy so you can understand the risks, benefits, and options. Having these discussions early will give you time to make your birth plan. Who are the best candidates for VBAC? The best candidates for VBAC are women who:  Have had one or two prior cesarean deliveries, and the incision made during the delivery was horizontal (low transverse).  Do not have a vertical (classical) scar on their uterus.  Have not had a tear in the wall of their uterus (uterine rupture).  Plan to have more pregnancies. A VBAC is also more likely to be successful:  In women who have previously given birth vaginally.  When labor starts by itself (spontaneously) before the due date. What are the benefits of VBAC? The benefits of delivering your baby vaginally instead of by a cesarean delivery include:  A shorter hospital stay.  A faster recovery time.  Less pain.  Avoiding risks associated with major surgery, such as infection and blood clots.  Less blood loss and less need for donated blood (transfusions). What are the risks of VBAC? The main risk of attempting a VBAC is that it may fail, forcing your health care provider to deliver your baby by a C-section. Other risks are rare and include:  Tearing (rupture) of the scar from a past cesarean delivery.  Other risks associated with vaginal deliveries. If a repeat cesarean delivery is needed, the risks include:  Blood loss.  Infection.  Blood clot.  Damage to surrounding organs.  Removal of the uterus (hysterectomy), if it is damaged.  Placenta problems in future pregnancies. What else should I know  about my options? Delivering a baby through a VBAC is similar to having a normal spontaneous vaginal delivery. Therefore, it is safe:  To try with twins.  For your health care provider to try to turn the baby from a breech position (external cephalic version) during labor.  With epidural analgesia for pain relief. Consider where you would like to deliver your baby. VBAC should be attempted in facilities where an emergency cesarean delivery can be performed. VBAC is not recommended for home births. Any changes in your health or your baby's health during your pregnancy may make it necessary to change your initial decision about VBAC. Your health care provider may recommend that you do not attempt a VBAC if:  Your baby's suspected weight is 8.8 lb (4 kg) or more.  You have preeclampsia. This is a condition that causes high blood pressure along with other symptoms, such as swelling and headaches.  You will have VBAC less than 19 months after your cesarean delivery.  You are past your due date.  You need to have labor started (induced) because your cervix is not ready for labor (unfavorable). Where to find more information  American Pregnancy Association: americanpregnancy.org  American Congress of Obstetricians and Gynecologists: acog.org Summary  Vaginal birth after cesarean delivery (VBAC) is giving birth vaginally after previously delivering a baby through a cesarean section (C-section). A VBAC may be a safe option for you, depending on your health and other factors.  Discuss VBAC with your health care provider early in your pregnancy so you can understand the risks, benefits, options, and   have plenty of time to make your birth plan.  The main risk of attempting a VBAC is that it may fail, forcing your health care provider to deliver your baby by a C-section. Other risks are rare. This information is not intended to replace advice given to you by your health care provider. Make sure  you discuss any questions you have with your health care provider. Document Revised: 12/02/2018 Document Reviewed: 11/13/2016 Elsevier Patient Education  2020 [employer]  

## 2020-01-02 LAB — CULTURE, BETA STREP (GROUP B ONLY): Strep Gp B Culture: NEGATIVE

## 2020-01-04 ENCOUNTER — Other Ambulatory Visit: Payer: Self-pay

## 2020-01-04 ENCOUNTER — Ambulatory Visit (INDEPENDENT_AMBULATORY_CARE_PROVIDER_SITE_OTHER): Payer: Medicaid Other | Admitting: Family Medicine

## 2020-01-04 VITALS — BP 104/62 | HR 85 | Wt 249.4 lb

## 2020-01-04 DIAGNOSIS — Z8619 Personal history of other infectious and parasitic diseases: Secondary | ICD-10-CM | POA: Diagnosis not present

## 2020-01-04 DIAGNOSIS — Z3483 Encounter for supervision of other normal pregnancy, third trimester: Secondary | ICD-10-CM

## 2020-01-04 NOTE — Progress Notes (Signed)
  [facility] Prenatal Visit  Chelsea Baldwin is a 29 y.o. G3P2002 at [redacted]w[redacted]d here for routine follow up. She is dated by early ultrasound.  She reports no contractions, no leaking and vaginal odor without discharge or dysuria. Mucus like blood from cervical plug loss. . She reports fetal movement. She denies vaginal bleeding, contractions, or loss of fluid. See flow sheet for details.  Vitals:   01/04/20 0846  BP: 104/62  Pulse: 85    A/P: Pregnancy at [redacted]w[redacted]d.  Doing well.   . Dating reviewed, dating tab is correct . Fetal heart tones Appropriate . Fundal height within expected range.  . Fetal position confirmed Vertex using Ultrasound .  Marland Kitchen Pregnancy issues include prior C/s, Hx of GBS + in prior pregnancies. . Problem list updated: No . GBS not collected today due to prior collection; was negative. Will get urine cx today. .  . Repeat GC/CT not collected today due to prior collection and was negative.  . The patient does not have a history of HSV and valacyclovir is not at this time.   . Infant feeding choice: Breastfeeding . Contraception choice: Condoms . Infant circumcision desired yes  . Influenza vaccine previously administered.   . Tdap previously administered between 27-36 weeks   Pregnancy education regarding preterm labor, fetal movement,  benefits of breastfeeding, contraception, fetal growth, expected weight gain, and safe infant sleep were discussed.  Follow up 1 week.

## 2020-01-04 NOTE — Patient Instructions (Signed)
It was very nice to meet you today. Please enjoy the rest of your week. See you in 1 week. Please call if you have vaginal bleeding, loss of fluid, or decreased fetal movement. Look out for these symptoms below.  Signs and Symptoms of Labor Labor is your body's natural process of moving your baby, placenta, and umbilical cord out of your uterus. The process of labor usually starts when your baby is full-term, between 38 and 40 weeks of pregnancy. How will I know when I am close to going into labor? As your body prepares for labor and the birth of your baby, you may notice the following symptoms in the weeks and days before true labor starts:  Having a strong desire to get your home ready to receive your new baby. This is called nesting. Nesting may be a sign that labor is approaching, and it may occur several weeks before birth. Nesting may involve cleaning and organizing your home.  Passing a small amount of thick, bloody mucus out of your vagina (normal bloody show or losing your mucus plug). This may happen more than a week before labor begins, or it might occur right before labor begins as the opening of the cervix starts to widen (dilate). For some women, the entire mucus plug passes at once. For others, smaller portions of the mucus plug may gradually pass over several days.  Your baby moving (dropping) lower in your pelvis to get into position for birth (lightening). When this happens, you may feel more pressure on your bladder and pelvic bone and less pressure on your ribs. This may make it easier to breathe. It may also cause you to need to urinate more often and have problems with bowel movements.  Having "practice contractions" (Braxton Hicks contractions) that occur at irregular (unevenly spaced) intervals that are more than 10 minutes apart. This is also called false labor. False labor contractions are common after exercise or sexual activity, and they will stop if you change position,  rest, or drink fluids. These contractions are usually mild and do not get stronger over time. They may feel like: ? A backache or back pain. ? Mild cramps, similar to menstrual cramps. ? Tightening or pressure in your abdomen. Other early symptoms that labor may be starting soon include:  Nausea or loss of appetite.  Diarrhea.  Having a sudden burst of energy, or feeling very tired.  Mood changes.  Having trouble sleeping. How will I know when labor has begun? Signs that true labor has begun may include:  Having contractions that come at regular (evenly spaced) intervals and increase in intensity. This may feel like more intense tightening or pressure in your abdomen that moves to your back. ? Contractions may also feel like rhythmic pain in your upper thighs or back that comes and goes at regular intervals. ? For first-time mothers, this change in intensity of contractions often occurs at a more gradual pace. ? Women who have given birth before may notice a more rapid progression of contraction changes.  Having a feeling of pressure in the vaginal area.  Your water breaking (rupture of membranes). This is when the sac of fluid that surrounds your baby breaks. When this happens, you will notice fluid leaking from your vagina. This may be clear or blood-tinged. Labor usually starts within 24 hours of your water breaking, but it may take longer to begin. ? Some women notice this as a gush of fluid. ? Others notice that their underwear  repeatedly becomes damp. Follow these instructions at home:   When labor starts, or if your water breaks, call your health care provider or nurse care line. Based on your situation, they will determine when you should go in for an exam.  When you are in early labor, you may be able to rest and manage symptoms at home. Some strategies to try at home include: ? Breathing and relaxation techniques. ? Taking a warm bath or shower. ? Listening to  music. ? Using a heating pad on the lower back for pain. If you are directed to use heat:  Place a towel between your skin and the heat source.  Leave the heat on for 20-30 minutes.  Remove the heat if your skin turns bright red. This is especially important if you are unable to feel pain, heat, or cold. You may have a greater risk of getting burned. Get help right away if:  You have painful, regular contractions that are 5 minutes apart or less.  Labor starts before you are [redacted] weeks along in your pregnancy.  You have a fever.  You have a headache that does not go away.  You have bright red blood coming from your vagina.  You do not feel your baby moving.  You have a sudden onset of: ? Severe headache with vision problems. ? Nausea, vomiting, or diarrhea. ? Chest pain or shortness of breath. These symptoms may be an emergency. If your health care provider recommends that you go to the hospital or birth center where you plan to deliver, do not drive yourself. Have someone else drive you, or call emergency services (911 in the [place] Summary  Labor is your body's natural process of moving your baby, placenta, and umbilical cord out of your uterus.  The process of labor usually starts when your baby is full-term, between 84 and 40 weeks of pregnancy.  When labor starts, or if your water breaks, call your health care provider or nurse care line. Based on your situation, they will determine when you should go in for an exam. This information is not intended to replace advice given to you by your health care provider. Make sure you discuss any questions you have with your health care provider. Document Revised: 05/06/2017 Document Reviewed: 01/11/2017 Elsevier Patient Education  [place].

## 2020-01-06 LAB — URINE CULTURE, OB REFLEX

## 2020-01-06 LAB — CULTURE, OB URINE

## 2020-01-14 ENCOUNTER — Ambulatory Visit (INDEPENDENT_AMBULATORY_CARE_PROVIDER_SITE_OTHER): Payer: Medicaid Other | Admitting: Family Medicine

## 2020-01-14 ENCOUNTER — Encounter: Payer: Self-pay | Admitting: Family Medicine

## 2020-01-14 ENCOUNTER — Other Ambulatory Visit: Payer: Self-pay

## 2020-01-14 VITALS — BP 124/72 | HR 81 | Wt 247.6 lb

## 2020-01-14 DIAGNOSIS — D582 Other hemoglobinopathies: Secondary | ICD-10-CM | POA: Diagnosis not present

## 2020-01-14 DIAGNOSIS — Z3480 Encounter for supervision of other normal pregnancy, unspecified trimester: Secondary | ICD-10-CM | POA: Diagnosis not present

## 2020-01-14 MED ORDER — VALACYCLOVIR HCL 500 MG PO TABS: 500.0000 mg | ORAL_TABLET | Freq: Two times a day (BID) | ORAL | 0 refills | Status: DC

## 2020-01-14 NOTE — Patient Instructions (Addendum)
Go to the MAU at [place] [facility] if:  You begin to have strong, frequent contractions  Your water breaks.  Sometimes it is a big gush of fluid, sometimes it is just a trickle that keeps getting your underwear wet or running down your legs  You have vaginal bleeding.  It is normal to have a small amount of spotting if your cervix was checked.   You do not feel your baby moving like normal.  If you do not, get something to eat and drink and lay down and focus on feeling your baby move.   If your baby is still not moving like normal, you should go to MAU.   Checking herpes lab. Take valtrex 500mg  twice a day - if your lab is negative we will stop it  Follow up in 1 week if not delivered by then  Be well, Dr. Ardelia Mems    Third Trimester of Pregnancy The third trimester is from week 28 through week 40 (months 7 through 9). The third trimester is a time when the unborn baby (fetus) is growing rapidly. At the end of the ninth month, the fetus is about 20 inches in length and weighs 6-10 pounds. Body changes during your third trimester Your body will continue to go through many changes during pregnancy. The changes vary from woman to woman. During the third trimester:  Your weight will continue to increase. You can expect to gain 25-35 pounds (11-16 kg) by the end of the pregnancy.  You may begin to get stretch marks on your hips, abdomen, and breasts.  You may urinate more often because the fetus is moving lower into your pelvis and pressing on your bladder.  You may develop or continue to have heartburn. This is caused by increased hormones that slow down muscles in the digestive tract.  You may develop or continue to have constipation because increased hormones slow digestion and cause the muscles that push waste through your intestines to relax.  You may develop hemorrhoids. These are swollen veins (varicose veins) in the rectum that can itch or be  painful.  You may develop swollen, bulging veins (varicose veins) in your legs.  You may have increased body aches in the pelvis, back, or thighs. This is due to weight gain and increased hormones that are relaxing your joints.  You may have changes in your hair. These can include thickening of your hair, rapid growth, and changes in texture. Some women also have hair loss during or after pregnancy, or hair that feels dry or thin. Your hair will most likely return to normal after your baby is born.  Your breasts will continue to grow and they will continue to become tender. A yellow fluid (colostrum) may leak from your breasts. This is the first milk you are producing for your baby.  Your belly button may stick out.  You may notice more swelling in your hands, face, or ankles.  You may have increased tingling or numbness in your hands, arms, and legs. The skin on your belly may also feel numb.  You may feel Sailer of breath because of your expanding uterus.  You may have more problems sleeping. This can be caused by the size of your belly, increased need to urinate, and an increase in your body's metabolism.  You may notice the fetus "dropping," or moving lower in your abdomen (lightening).  You may have increased vaginal discharge.  You may notice your joints feel loose  and you may have pain around your pelvic bone. What to expect at prenatal visits You will have prenatal exams every 2 weeks until week 36. Then you will have weekly prenatal exams. During a routine prenatal visit:  You will be weighed to make sure you and the baby are growing normally.  Your blood pressure will be taken.  Your abdomen will be measured to track your baby's growth.  The fetal heartbeat will be listened to.  Any test results from the previous visit will be discussed.  You may have a cervical check near your due date to see if your cervix has softened or thinned (effaced).  You will be tested for  Group B streptococcus. This happens between 35 and 37 weeks. Your health care provider may ask you:  What your birth plan is.  How you are feeling.  If you are feeling the baby move.  If you have had any abnormal symptoms, such as leaking fluid, bleeding, severe headaches, or abdominal cramping.  If you are using any tobacco products, including cigarettes, chewing tobacco, and electronic cigarettes.  If you have any questions. Other tests or screenings that may be performed during your third trimester include:  Blood tests that check for low iron levels (anemia).  Fetal testing to check the health, activity level, and growth of the fetus. Testing is done if you have certain medical conditions or if there are problems during the pregnancy.  Nonstress test (NST). This test checks the health of your baby to make sure there are no signs of problems, such as the baby not getting enough oxygen. During this test, a belt is placed around your belly. The baby is made to move, and its heart rate is monitored during movement. What is false labor? False labor is a condition in which you feel small, irregular tightenings of the muscles in the womb (contractions) that usually go away with rest, changing position, or drinking water. These are called Braxton Hicks contractions. Contractions may last for hours, days, or even weeks before true labor sets in. If contractions come at regular intervals, become more frequent, increase in intensity, or become painful, you should see your health care provider. What are the signs of labor?  Abdominal cramps.  Regular contractions that start at 10 minutes apart and become stronger and more frequent with time.  Contractions that start on the top of the uterus and spread down to the lower abdomen and back.  Increased pelvic pressure and dull back pain.  A watery or bloody mucus discharge that comes from the vagina.  Leaking of amniotic fluid. This is also  known as your "water breaking." It could be a slow trickle or a gush. Let your health care provider know if it has a color or strange odor. If you have any of these signs, call your health care provider right away, even if it is before your due date. Follow these instructions at home: Medicines  Follow your health care provider's instructions regarding medicine use. Specific medicines may be either safe or unsafe to take during pregnancy.  Take a prenatal vitamin that contains at least 600 micrograms (mcg) of folic acid.  If you develop constipation, try taking a stool softener if your health care provider approves. Eating and drinking   Eat a balanced diet that includes fresh fruits and vegetables, whole grains, good sources of protein such as meat, eggs, or tofu, and low-fat dairy. Your health care provider will help you determine the amount of  weight gain that is right for you.  Avoid raw meat and uncooked cheese. These carry germs that can cause birth defects in the baby.  If you have low calcium intake from food, talk to your health care provider about whether you should take a daily calcium supplement.  Eat four or five small meals rather than three large meals a day.  Limit foods that are high in fat and processed sugars, such as fried and sweet foods.  To prevent constipation: ? Drink enough fluid to keep your urine clear or pale yellow. ? Eat foods that are high in fiber, such as fresh fruits and vegetables, whole grains, and beans. Activity  Exercise only as directed by your health care provider. Most women can continue their usual exercise routine during pregnancy. Try to exercise for 30 minutes at least 5 days a week. Stop exercising if you experience uterine contractions.  Avoid heavy lifting.  Do not exercise in extreme heat or humidity, or at high altitudes.  Wear low-heel, comfortable shoes.  Practice good posture.  You may continue to have sex unless your health  care provider tells you otherwise. Relieving pain and discomfort  Take frequent breaks and rest with your legs elevated if you have leg cramps or low back pain.  Take warm sitz baths to soothe any pain or discomfort caused by hemorrhoids. Use hemorrhoid cream if your health care provider approves.  Wear a good support bra to prevent discomfort from breast tenderness.  If you develop varicose veins: ? Wear support pantyhose or compression stockings as told by your healthcare provider. ? Elevate your feet for 15 minutes, 3-4 times a day. Prenatal care  Write down your questions. Take them to your prenatal visits.  Keep all your prenatal visits as told by your health care provider. This is important. Safety  Wear your seat belt at all times when driving.  Make a list of emergency phone numbers, including numbers for family, friends, the hospital, and police and fire departments. General instructions  Avoid cat litter boxes and soil used by cats. These carry germs that can cause birth defects in the baby. If you have a cat, ask someone to clean the litter box for you.  Do not travel far distances unless it is absolutely necessary and only with the approval of your health care provider.  Do not use hot tubs, steam rooms, or saunas.  Do not drink alcohol.  Do not use any products that contain nicotine or tobacco, such as cigarettes and e-cigarettes. If you need help quitting, ask your health care provider.  Do not use any medicinal herbs or unprescribed drugs. These chemicals affect the formation and growth of the baby.  Do not douche or use tampons or scented sanitary pads.  Do not cross your legs for long periods of time.  To prepare for the arrival of your baby: ? Take prenatal classes to understand, practice, and ask questions about labor and delivery. ? Make a trial run to the hospital. ? Visit the hospital and tour the maternity area. ? Arrange for maternity or paternity  leave through employers. ? Arrange for family and friends to take care of pets while you are in the hospital. ? Purchase a rear-facing car seat and make sure you know how to install it in your car. ? Pack your hospital bag. ? Prepare the baby's nursery. Make sure to remove all pillows and stuffed animals from the baby's crib to prevent suffocation.  Visit your dentist  if you have not gone during your pregnancy. Use a soft toothbrush to brush your teeth and be gentle when you floss. Contact a health care provider if:  You are unsure if you are in labor or if your water has broken.  You become dizzy.  You have mild pelvic cramps, pelvic pressure, or nagging pain in your abdominal area.  You have lower back pain.  You have persistent nausea, vomiting, or diarrhea.  You have an unusual or bad smelling vaginal discharge.  You have pain when you urinate. Get help right away if:  Your water breaks before 37 weeks.  You have regular contractions less than 5 minutes apart before 37 weeks.  You have a fever.  You are leaking fluid from your vagina.  You have spotting or bleeding from your vagina.  You have severe abdominal pain or cramping.  You have rapid weight loss or weight gain.  You have shortness of breath with chest pain.  You notice sudden or extreme swelling of your face, hands, ankles, feet, or legs.  Your baby makes fewer than 10 movements in 2 hours.  You have severe headaches that do not go away when you take medicine.  You have vision changes. Summary  The third trimester is from week 28 through week 40, months 7 through 9. The third trimester is a time when the unborn baby (fetus) is growing rapidly.  During the third trimester, your discomfort may increase as you and your baby continue to gain weight. You may have abdominal, leg, and back pain, sleeping problems, and an increased need to urinate.  During the third trimester your breasts will keep growing  and they will continue to become tender. A yellow fluid (colostrum) may leak from your breasts. This is the first milk you are producing for your baby.  False labor is a condition in which you feel small, irregular tightenings of the muscles in the womb (contractions) that eventually go away. These are called Braxton Hicks contractions. Contractions may last for hours, days, or even weeks before true labor sets in.  Signs of labor can include: abdominal cramps; regular contractions that start at 10 minutes apart and become stronger and more frequent with time; watery or bloody mucus discharge that comes from the vagina; increased pelvic pressure and dull back pain; and leaking of amniotic fluid. This information is not intended to replace advice given to you by your health care provider. Make sure you discuss any questions you have with your health care provider. Document Revised: 11/27/2018 Document Reviewed: 09/11/2016 Elsevier Patient Education  2020 [employer].

## 2020-01-14 NOTE — Progress Notes (Signed)
[facility] Visit  Chelsea Baldwin is a 29 y.o. G3P2002 at [redacted]w[redacted]d (via [redacted]w[redacted]d u/s) who presents to [facility] for routine follow up. Prenatal course, history, notes, ultrasounds, and laboratory results reviewed.  Denies fluid leaking or decreased fetal movement. Around 3am today began having ctx 4-5 minutes apart, but they have slowed over the last hour. Never got to be too painful. Had some very light spotting when she wiped a while ago, otherwise no vaginal bleeding.  Postpartum Plans: - delivery planning: plans TOLAC, had consent visit with OBGYN 5/14 - circumcision: yes, in hospital - feeding: breast - pediatrician: [place] [facility] - contraception: condoms, considering IUD or nexplanon  FHR: 155cm Uterine size: 45cm (habitus makes unreliable) Cervix 1/30/ballotable, vertex presentation  Assessment & Plan  1. Routine prenatal care: overall doing well - despite ctx this AM, no signs of active labor given cervical exam, ctx have spaced - reviewed reasons to go to MAU, advised to go in if ctx pick up to q5 minutes (should be monitored in labor given desire for TOLAC) - patient still considering contraception options, discussed possibility of postplacental IUD - GBS negative. 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 No indications for intrapartum ppx.   2. Uncertain HSV history - noted on infection screening questions from initial OB visit that there was question of possible HSV. Discussed with patient today. She said there was one time where she had a bump in her pelvic area, possibly a hair bump. Had it examined at that time, and was told it did not appear to be HSV. Has never been diagnosed with HSV or had a known partner with HSV. No current lesions. Doubt HSV, though possibility exists. - discussed options with patient - could do nothing, empirically  prophylax with valtrex, or check serum antibodies to HSV-2 - after discussion and shared decision making, elected to empirically start valtrex and check Ab titers. If HSV2 IgG negative, lower suspicion for history of HSV and she can stop valtrex. Patient agreeable.   3. History of prior C/S for NRFHT - planning for TOLAC, has completed consent visit with OBGYN.  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 Patient never saw genetics this pregnancy, likely moot at this gestational age. This FOB is a different FOB than her other children (prior FOB had testing done to rule out SS trait), so possibility of child having HgbCC disease, C trait or [place] disease exists. Will await newborn screen after delivery.  5. THC use earlier in pregnancy - patient confirms she has not recently used THC. Discussed risks to her and baby, also need for CPS involvement if detected during hospital stay.  6. Maternal obesity - acceptable TWG this pregnancy. Habitus limits assessment of fundal height. Had been getting q4w growth scans, last scan on 5/11 with appropriate growth, EFW of 3430g, normal AFI.  Next prenatal visit in 1 week if not already delivered. Labor & fetal movement precautions discussed.  Levert Feinstein, MD [facility]

## 2020-01-15 ENCOUNTER — Inpatient Hospital Stay (HOSPITAL_COMMUNITY)
Admission: AD | Admit: 2020-01-15 | Discharge: 2020-01-17 | DRG: 806 | Disposition: A | Discharge status: Home / Self Care | Payer: Medicaid Other | Attending: Obstetrics and Gynecology | Admitting: Obstetrics and Gynecology

## 2020-01-15 ENCOUNTER — Encounter (HOSPITAL_COMMUNITY): Payer: Self-pay | Admitting: Obstetrics & Gynecology

## 2020-01-15 DIAGNOSIS — O99324 Drug use complicating childbirth: Secondary | ICD-10-CM | POA: Diagnosis present

## 2020-01-15 DIAGNOSIS — A6000 Herpesviral infection of urogenital system, unspecified: Secondary | ICD-10-CM | POA: Diagnosis present

## 2020-01-15 DIAGNOSIS — O34219 Maternal care for unspecified type scar from previous cesarean delivery: Secondary | ICD-10-CM | POA: Diagnosis not present

## 2020-01-15 DIAGNOSIS — D582 Other hemoglobinopathies: Secondary | ICD-10-CM | POA: Diagnosis present

## 2020-01-15 DIAGNOSIS — O99210 Obesity complicating pregnancy, unspecified trimester: Secondary | ICD-10-CM | POA: Diagnosis present

## 2020-01-15 DIAGNOSIS — O134 Gestational [pregnancy-induced] hypertension without significant proteinuria, complicating childbirth: Secondary | ICD-10-CM | POA: Diagnosis not present

## 2020-01-15 DIAGNOSIS — Z3A39 39 weeks gestation of pregnancy: Secondary | ICD-10-CM | POA: Diagnosis not present

## 2020-01-15 DIAGNOSIS — E669 Obesity, unspecified: Secondary | ICD-10-CM | POA: Diagnosis present

## 2020-01-15 DIAGNOSIS — O139 Gestational [pregnancy-induced] hypertension without significant proteinuria, unspecified trimester: Secondary | ICD-10-CM

## 2020-01-15 DIAGNOSIS — N858 Other specified noninflammatory disorders of uterus: Secondary | ICD-10-CM | POA: Diagnosis not present

## 2020-01-15 DIAGNOSIS — Z3490 Encounter for supervision of normal pregnancy, unspecified, unspecified trimester: Secondary | ICD-10-CM

## 2020-01-15 DIAGNOSIS — O26893 Other specified pregnancy related conditions, third trimester: Secondary | ICD-10-CM | POA: Diagnosis present

## 2020-01-15 DIAGNOSIS — O9832 Other infections with a predominantly sexual mode of transmission complicating childbirth: Secondary | ICD-10-CM | POA: Diagnosis not present

## 2020-01-15 DIAGNOSIS — O99214 Obesity complicating childbirth: Secondary | ICD-10-CM | POA: Diagnosis present

## 2020-01-15 DIAGNOSIS — F1210 Cannabis abuse, uncomplicated: Secondary | ICD-10-CM | POA: Diagnosis present

## 2020-01-15 DIAGNOSIS — Z20822 Contact with and (suspected) exposure to covid-19: Secondary | ICD-10-CM | POA: Diagnosis present

## 2020-01-15 DIAGNOSIS — Z8759 Personal history of other complications of pregnancy, childbirth and the puerperium: Secondary | ICD-10-CM

## 2020-01-15 LAB — CBC
HCT: 33.8 % — ABNORMAL LOW (ref 36.0–46.0)
Hemoglobin: 11.8 g/dL — ABNORMAL LOW (ref 12.0–15.0)
MCH: 28 pg (ref 26.0–34.0)
MCHC: 34.9 g/dL (ref 30.0–36.0)
MCV: 80.3 fL (ref 80.0–100.0)
Platelets: 315 10*3/uL (ref 150–400)
RBC: 4.21 MIL/uL (ref 3.87–5.11)
RDW: 13.2 % (ref 11.5–15.5)
WBC: 12 10*3/uL — ABNORMAL HIGH (ref 4.0–10.5)
nRBC: 0 % (ref 0.0–0.2)

## 2020-01-15 LAB — COMPREHENSIVE METABOLIC PANEL
ALT: 17 U/L (ref 0–44)
AST: 18 U/L (ref 15–41)
Albumin: 2.6 g/dL — ABNORMAL LOW (ref 3.5–5.0)
Alkaline Phosphatase: 154 U/L — ABNORMAL HIGH (ref 38–126)
Anion gap: 8 (ref 5–15)
BUN: 8 mg/dL (ref 6–20)
CO2: 20 mmol/L — ABNORMAL LOW (ref 22–32)
Calcium: 8.8 mg/dL — ABNORMAL LOW (ref 8.9–10.3)
Chloride: 105 mmol/L (ref 98–111)
Creatinine, Ser: 0.84 mg/dL (ref 0.44–1.00)
GFR calc Af Amer: >60 mL/min (ref >60)
GFR calc non Af Amer: >60 mL/min (ref >60)
Glucose, Bld: 90 mg/dL (ref 70–99)
Potassium: 4.3 mmol/L (ref 3.5–5.1)
Sodium: 133 mmol/L — ABNORMAL LOW (ref 135–145)
Total Bilirubin: 0.5 mg/dL (ref 0.3–1.2)
Total Protein: 6.5 g/dL (ref 6.5–8.1)

## 2020-01-15 LAB — PROTEIN / CREATININE RATIO, URINE
Creatinine, Urine: 52.86 mg/dL
Protein Creatinine Ratio: 0.11 mg/mg{Cre} (ref 0.00–0.15)
Total Protein, Urine: 6 mg/dL

## 2020-01-15 LAB — SARS CORONAVIRUS 2 BY RT PCR (HOSPITAL ORDER, PERFORMED IN ~~LOC~~ HOSPITAL LAB): SARS Coronavirus 2: NEGATIVE

## 2020-01-15 LAB — RPR: RPR Ser Ql: NONREACTIVE

## 2020-01-15 LAB — TYPE AND SCREEN
ABO/RH(D): O POS
Antibody Screen: NEGATIVE

## 2020-01-15 LAB — HSV 2 ANTIBODY, IGG: HSV 2 IgG, Type Spec: 16.6 index — ABNORMAL HIGH (ref 0.00–0.90)

## 2020-01-15 LAB — ABO/RH: ABO/RH(D): O POS

## 2020-01-15 MED ORDER — COCONUT OIL OIL: 1.0000 "application " | TOPICAL_OIL | Status: DC | PRN

## 2020-01-15 MED ORDER — OXYTOCIN 40 UNITS IN NORMAL SALINE INFUSION - SIMPLE MED
1.0000 m[IU]/min | INTRAVENOUS | Status: DC
  Administered 2020-01-15: 2 m[IU]/min via INTRAVENOUS
  Filled 2020-01-15: qty 1000

## 2020-01-15 MED ORDER — LACTATED RINGERS IV SOLN: INTRAVENOUS | Status: DC

## 2020-01-15 MED ORDER — DIPHENHYDRAMINE HCL 25 MG PO CAPS: 25.0000 mg | ORAL_CAPSULE | Freq: Four times a day (QID) | ORAL | Status: DC | PRN

## 2020-01-15 MED ORDER — LACTATED RINGERS IV SOLN: 500.0000 mL | INTRAVENOUS | Status: DC | PRN

## 2020-01-15 MED ORDER — WITCH HAZEL-GLYCERIN EX PADS: 1.0000 "application " | MEDICATED_PAD | CUTANEOUS | Status: DC | PRN

## 2020-01-15 MED ORDER — ACETAMINOPHEN 325 MG PO TABS
650.0000 mg | ORAL_TABLET | ORAL | Status: DC | PRN
  Administered 2020-01-15 – 2020-01-16 (×2): 650 mg via ORAL
  Filled 2020-01-15 (×2): qty 2

## 2020-01-15 MED ORDER — TERBUTALINE SULFATE 1 MG/ML IJ SOLN: 0.2500 mg | Freq: Once | INTRAMUSCULAR | Status: DC | PRN

## 2020-01-15 MED ORDER — DIBUCAINE (PERIANAL) 1 % EX OINT: 1.0000 "application " | TOPICAL_OINTMENT | CUTANEOUS | Status: DC | PRN

## 2020-01-15 MED ORDER — PRENATAL MULTIVITAMIN CH
1.0000 | ORAL_TABLET | Freq: Every day | ORAL | Status: DC
  Administered 2020-01-16: 1 via ORAL
  Filled 2020-01-15: qty 1

## 2020-01-15 MED ORDER — OXYTOCIN 40 UNITS IN NORMAL SALINE INFUSION - SIMPLE MED: 1.0000 m[IU]/min | INTRAVENOUS | Status: DC

## 2020-01-15 MED ORDER — ONDANSETRON HCL 4 MG/2ML IJ SOLN: 4.0000 mg | INTRAMUSCULAR | Status: DC | PRN

## 2020-01-15 MED ORDER — SIMETHICONE 80 MG PO CHEW: 80.0000 mg | CHEWABLE_TABLET | ORAL | Status: DC | PRN

## 2020-01-15 MED ORDER — TETANUS-DIPHTH-ACELL PERTUSSIS 5-2.5-18.5 LF-MCG/0.5 IM SUSP: 0.5000 mL | Freq: Once | INTRAMUSCULAR | Status: DC

## 2020-01-15 MED ORDER — ZOLPIDEM TARTRATE 5 MG PO TABS: 5.0000 mg | ORAL_TABLET | Freq: Every evening | ORAL | Status: DC | PRN

## 2020-01-15 MED ORDER — IBUPROFEN 600 MG PO TABS
600.0000 mg | ORAL_TABLET | Freq: Four times a day (QID) | ORAL | Status: DC
  Administered 2020-01-15 – 2020-01-17 (×6): 600 mg via ORAL
  Filled 2020-01-15 (×6): qty 1

## 2020-01-15 MED ORDER — BENZOCAINE-MENTHOL 20-0.5 % EX AERO: 1.0000 "application " | INHALATION_SPRAY | CUTANEOUS | Status: DC | PRN

## 2020-01-15 MED ORDER — SENNOSIDES-DOCUSATE SODIUM 8.6-50 MG PO TABS
2.0000 | ORAL_TABLET | ORAL | Status: DC
  Administered 2020-01-15 – 2020-01-17 (×2): 2 via ORAL
  Filled 2020-01-15 (×2): qty 2

## 2020-01-15 MED ORDER — ACETAMINOPHEN 325 MG PO TABS: 650.0000 mg | ORAL_TABLET | ORAL | Status: DC | PRN

## 2020-01-15 MED ORDER — OXYTOCIN BOLUS FROM INFUSION
500.0000 mL | Freq: Once | INTRAVENOUS | Status: AC
  Administered 2020-01-15: 500 mL via INTRAVENOUS

## 2020-01-15 MED ORDER — ONDANSETRON HCL 4 MG/2ML IJ SOLN: 4.0000 mg | Freq: Four times a day (QID) | INTRAMUSCULAR | Status: DC | PRN

## 2020-01-15 MED ORDER — OXYTOCIN 40 UNITS IN NORMAL SALINE INFUSION - SIMPLE MED: 2.5000 [IU]/h | INTRAVENOUS | Status: DC

## 2020-01-15 MED ORDER — LIDOCAINE HCL (PF) 1 % IJ SOLN
30.0000 mL | INTRAMUSCULAR | Status: AC | PRN
  Administered 2020-01-15: 30 mL via SUBCUTANEOUS
  Filled 2020-01-15: qty 30

## 2020-01-15 MED ORDER — OXYCODONE HCL 5 MG PO TABS: 5.0000 mg | ORAL_TABLET | ORAL | Status: DC | PRN

## 2020-01-15 MED ORDER — ONDANSETRON HCL 4 MG PO TABS: 4.0000 mg | ORAL_TABLET | ORAL | Status: DC | PRN

## 2020-01-15 MED ORDER — SOD CITRATE-CITRIC ACID 500-334 MG/5ML PO SOLN: 30.0000 mL | ORAL | Status: DC | PRN

## 2020-01-15 NOTE — MAU Note (Signed)
PT SAYS WAS 1 CM YESTERDAY IN OFFICE- [facility] .   Marland KitchenHAD  MRSA  ON LABIA- IN MARCH - DRAINED  ALL HEALED NOW.   DENIES HSV OUTBREAK- DOES NOT TAKE VALTREX. Marland Kitchen GBS- NEG

## 2020-01-15 NOTE — MAU Provider Note (Signed)
History     CSN: [id number]  Arrival date and time: 01/15/20 7169   First Provider Initiated Contact with Patient 01/15/20 [phone]      No chief complaint on file.  Chelsea Baldwin is a 29 y.o. G3P2002 at [redacted]w[redacted]d who receives care at [facility].  She presents today for contractions.  Upon evaluation for labor, patient with elevated blood pressures.  Patient denies HA, visual disturbances, or significant edema.  Patient endorses fetal movement and denies vaginal bleeding or leaking of fluid.      OB History    Gravida  3   Para  2   Term  2   Preterm  0   AB  0   Living  2     SAB      TAB      Ectopic      Multiple      Live Births  2        Obstetric Comments  G2- Emergency due to bradycardia, 2017, delivered at [facility] -- 6 pounds 3 ounces  G1- Boy, 2012, 6 pounds 1 ounce - Severe anemia in G1 pregnancy         Past Medical History:  Diagnosis Date  . Anemia of pregnancy 06/02/2018    Past Surgical History:  Procedure Laterality Date  . CESAREAN SECTION  2017   G2     History reviewed. No pertinent family history.  Social History   Tobacco Use  . Smoking status: Never Smoker  . Smokeless tobacco: Never Used  Substance Use Topics  . Alcohol use: Not Currently  . Drug use: Yes    Types: Marijuana    Comment: LAST SMOKED- 35 WEEKS     Allergies: No Known Allergies  Medications Prior to Admission  Medication Sig Dispense Refill Last Dose  . Prenatal Vit-Fe Fum-FA-Omega (PRENATAL FORMULA) 28-0.8-235 MG CAPS Take 1 tablet by mouth daily. 30 capsule  Past Week at Unknown time  . Doxylamine-Pyridoxine (DICLEGIS) 10-10 MG TBEC Take 1 tablet by mouth at bedtime as needed (nausea). (Patient not taking: Reported on 12/29/2019) 60 tablet 0   . fluticasone (FLONASE) 50 MCG/ACT nasal spray Place 2 sprays into both nostrils daily. (Patient not taking: Reported on 12/29/2019) 16 g 6   . triamcinolone cream  (KENALOG) 0.1 % Apply 1 application topically 2 (two) times daily. (Patient not taking: Reported on 12/29/2019) 30 g 0   . valACYclovir (VALTREX) 500 MG tablet Take 1 tablet (500 mg total) by mouth 2 (two) times daily. 60 tablet 0 NOT TAKING    Review of Systems  Constitutional: Negative for chills and fever.  Genitourinary: Negative for difficulty urinating, dysuria, vaginal bleeding and vaginal discharge.  Neurological: Negative for dizziness, light-headedness and headaches.   Physical Exam   Blood pressure (!) 141/101, pulse 82, temperature 98.1 F (36.7 C), temperature source Oral, resp. rate 20, height 5\' 3"  (1.6 m), weight 112 kg, last menstrual period 04/08/2019, SpO2 100 %. Vitals:   01/15/20 0603 01/15/20 0616 01/15/20 0631 01/15/20 0646  BP: 126/90 132/90 (!) 141/89 125/87  Pulse: 92 89 88 93  Resp:      Temp:      TempSrc:      SpO2:      Weight:      Height:        Physical Exam  Constitutional: She is oriented to person, place, and time. She appears well-developed and well-nourished.  HENT:  Head: Normocephalic and  atraumatic.  Eyes: Conjunctivae are normal.  Cardiovascular: Normal rate, regular rhythm and normal heart sounds.  Respiratory: Effort normal and breath sounds normal.  GI: Soft. There is no abdominal tenderness.  Gravid--fundal height appears LGA, Soft, NT   Musculoskeletal:        General: Normal range of motion.     Cervical back: Normal range of motion.  Neurological: She is alert and oriented to person, place, and time.  Skin: Skin is warm and dry.  Psychiatric: She has a normal mood and affect. Her behavior is normal.    Fetal Assessment 135 bpm, Mod Var, -Decels, +Accels Toco: Q66min  MAU Course   Results for orders placed or performed during the hospital encounter of 01/15/20 (from the past 24 hour(s))  CBC     Status: Abnormal   Collection Time: 01/15/20  6:07 AM  Result Value Ref Range   WBC 12.0 (H) 4.0 - 10.5 K/uL   RBC 4.21 3.87  - 5.11 MIL/uL   Hemoglobin 11.8 (L) 12.0 - 15.0 g/dL   HCT 92.4 (L) 26.8 - 34.1 %   MCV 80.3 80.0 - 100.0 fL   MCH 28.0 26.0 - 34.0 pg   MCHC 34.9 30.0 - 36.0 g/dL   RDW 96.2 22.9 - 79.8 %   Platelets 315 150 - 400 K/uL   nRBC 0.0 0.0 - 0.2 %  Comprehensive metabolic panel     Status: Abnormal   Collection Time: 01/15/20  6:07 AM  Result Value Ref Range   Sodium 133 (L) 135 - 145 mmol/L   Potassium 4.3 3.5 - 5.1 mmol/L   Chloride 105 98 - 111 mmol/L   CO2 20 (L) 22 - 32 mmol/L   Glucose, Bld 90 70 - 99 mg/dL   BUN 8 6 - 20 mg/dL   Creatinine, Ser 9.21 0.44 - 1.00 mg/dL   Calcium 8.8 (L) 8.9 - 10.3 mg/dL   Total Protein 6.5 6.5 - 8.1 g/dL   Albumin 2.6 (L) 3.5 - 5.0 g/dL   AST 18 15 - 41 U/L   ALT 17 0 - 44 U/L   Alkaline Phosphatase 154 (H) 38 - 126 U/L   Total Bilirubin 0.5 0.3 - 1.2 mg/dL   GFR calc non Af Amer >60 >60 mL/min   GFR calc Af Amer >60 >60 mL/min   Anion gap 8 5 - 15   Patient informed that the ultrasound is considered a limited OB ultrasound and is not intended to be a complete ultrasound exam.  Patient also informed that the ultrasound is not being completed with the intent of assessing for fetal or placental anomalies or any pelvic abnormalities.  Explained that the purpose of today's ultrasound is to assess for  presentation.  Patient acknowledges the purpose of the exam and the limitations of the study.  -Vertex Presentation  MDM PE Labs: CBC, CMP, PC Ratio EFM  Assessment and Plan  29 year old G3P2002  SIUP at 39.2weeks Cat I FT Contractions Elevated BP   -POC reviewed. -Informed that she will likely be admitted for gHTN even if labs return normal. -Discussed desire for TOLAC. -Patient without questions or concerns. -Will await results.  Cherre Robins MSN, CNM 01/15/2020, 5:53 AM   Reassessment (7:11 AM) -Labs return without significance. -BP remain elevated. -NST NR -Dr. Hale Bogus consulted and informed of patient status and  recommendation for admission. Agrees and orders placed. -BSUS confirms vertex presentation. -Discussed usage of pitocin for induction/augmentation if necessary. -Patient without  questions or concerns. -Admit to L&D  Maryann Conners MSN, CNM Advanced Practice Provider, Center for [facility]

## 2020-01-15 NOTE — Progress Notes (Addendum)
Labor Progress Note Chelsea Baldwin is a 29 y.o. G3P2002 at [redacted]w[redacted]d presented for elevated blood pressures.  S: Increasing contraction frequency and pain.  Pain well controlled for now.    O:  BP 128/63   Pulse 75   Temp 98.1 F (36.7 C) (Oral)   Resp 18   Ht 5\' 3"  (1.6 m)   Wt 112 kg   LMP 04/08/2019   SpO2 100%   BMI 43.75 kg/m  EFM: 140/Moderate var/pos accels, occasion decels  CVE: Dilation: 4 Effacement (%): 60 Cervical Position: Anterior Station: Ballotable Presentation: Vertex(previous bs 04/10/2019) Exam by:: Dr [email or web address]   A&P: 29 y.o. 26 [redacted]w[redacted]d IOL for elevated blood pressure.  #Labor: Progressing well. Continue pit. AROM with moderate clear fluid at 1515. #Pain: trying to avoid Epi. PRN fentanyl for now. #FWB: Cat II. Reassuring based on good recovery, good variability  #GBS negative #HSV: no lesions, on valtrex (started yesterday) #elevated BP: PIH labs WNL. BP WNL since last check. Continue to monitor.  [redacted]w[redacted]d, MD 2:46 PM

## 2020-01-15 NOTE — Progress Notes (Signed)
Labor Progress Note Chelsea Baldwin is a 29 y.o. G3P2002 at [redacted]w[redacted]d presented for elevated blood pressures.  S: much stronger contractions.   O:  BP 138/86   Pulse 81   Temp 98.5 F (36.9 C) (Oral)   Resp 18   Ht 5\' 3"  (1.6 m)   Wt 112 kg   LMP 04/08/2019   SpO2 100%   BMI 43.75 kg/m  EFM: 140/moderate var/ tracing not tracking well.  Occasional decels, unclear if early or late.  CVE: Dilation: 6 Effacement (%): 70 Cervical Position: Posterior Station: -3, -2 Presentation: Vertex Exam by:: [email or web address]   A&P: 29 y.o. 26 [redacted]w[redacted]d IOL for elevated blood pressure.  #Labor: Progressing well. AROMed, on pit since 0940. FSE placed for better tracings. #Pain: trying to avoid Epi. PRN fentanyl/NO for now. #FWB: Cat II. Reassuring based on good recovery, good variability  #GBS negative #HSV: no lesions, on valtrex (started yesterday) #elevated BP: PIH labs WNL. BP WNL since last check. Continue to monitor.  0941, MD 5:38 PM

## 2020-01-15 NOTE — Discharge Summary (Addendum)
Postpartum Discharge Summary  Date of Service updated 01/17/2020     Patient Name: Chelsea Baldwin DOB: 07-Dec-1990 MRN: [id number]  Date of admission: 01/15/2020 Delivery date:01/15/2020  Delivering provider: Clarnce Flock  Date of discharge: 01/17/2020  Admitting diagnosis: Labor and delivery, indication for care [O75.9] Intrauterine pregnancy: [redacted]w[redacted]d    Secondary diagnosis:  Active Problems:   Supervision of normal pregnancy   Mild tetrahydrocannabinol (THC) abuse   Obesity affecting pregnancy   Previous cesarean delivery, antepartum   Hemoglobin C trait ([place]   Gestational hypertension   Labor and delivery, indication for care   VBAC, delivered  Additional problems: none    Discharge diagnosis: Term Pregnancy Delivered and VBAC                                              Post partum procedures: none Augmentation: AROM and Pitocin Complications: None  Hospital course: Onset of Labor With Vaginal Delivery      29y.o. yo [id number] admitted in Latent Labor on 01/15/2020. Patient had an uncomplicated labor course as follows: arrived at 3cm with irregular contractions and elevated BP but did not meet criteria for HTN at time of delivery. Initially augmented with pitocin, but after AROM entered good contraction pattern and did not require further augmentation to progress to full dilation.  Membrane Rupture Time/Date: 3:12 PM ,01/15/2020   Delivery Method:VBAC, Spontaneous  Episiotomy: None  Lacerations:  Labial  Patient had an uncomplicated postpartum course. BP's were 110s/60-70s. She is ambulating, tolerating a regular diet, passing flatus, and urinating well. Patient is discharged home in stable condition on 01/17/20.  Newborn Data: Birth date:01/15/2020  Birth time:6:14 PM  Gender:Female  Living status:Living  Apgars:9 ,9  Weight:3419 g   Magnesium Sulfate received: No BMZ received: No Rhophylac:N/A MMR:N/A T-DaP:Given prenatally Flu:  Yes Transfusion:No  Physical exam  Vitals:   01/16/20 0930 01/16/20 1500 01/16/20 2020 01/17/20 0542  BP: 110/62 115/65 117/81 119/82  Pulse: 80 76 79 70  Resp: _0 Temp: 98.1 F (36.7 C) 98 F (36.7 C) 98.6 F (37 C) 97.9 F (36.6 C)  TempSrc: Oral Oral Oral Oral  SpO2: 99% 100% 100%   Weight:      Height:       General: alert, cooperative and no distress Lochia: appropriate Uterine Fundus: firm Incision: N/A DVT Evaluation: No evidence of DVT seen on physical exam. Labs: Lab Results  Component Value Date   WBC 23.5 (H) 01/16/2020   HGB 10.7 (L) 01/16/2020   HCT 30.6 (L) 01/16/2020   MCV 81.0 01/16/2020   PLT 277 01/16/2020   CMP Latest Ref Rng & Units 01/15/2020  Glucose 70 - 99 mg/dL 90  BUN 6 - 20 mg/dL 8  Creatinine 0.44 - 1.00 mg/dL 0.84  Sodium 135 - 145 mmol/L 133(L)  Potassium 3.5 - 5.1 mmol/L 4.3  Chloride 98 - 111 mmol/L 105  CO2 22 - 32 mmol/L 20(L)  Calcium 8.9 - 10.3 mg/dL 8.8(L)  Total Protein 6.5 - 8.1 g/dL 6.5  Total Bilirubin 0.3 - 1.2 mg/dL 0.5  Alkaline Phos 38 - 126 U/L 154(H)  AST 15 - 41 U/L 18  ALT 0 - 44 U/L 17   Edinburgh Score: Edinburgh Postnatal Depression Scale Screening Tool 01/15/2020  I have been able to laugh and see the funny side  of things. 0  I have looked forward with enjoyment to things. 0  I have blamed myself unnecessarily when things went wrong. 0  I have been anxious or worried for no good reason. 0  I have felt scared or panicky for no good reason. 0  Things have been getting on top of me. 0  I have been so unhappy that I have had difficulty sleeping. 0  I have felt sad or miserable. 0  I have been so unhappy that I have been crying. 0  The thought of harming myself has occurred to me. 0  Edinburgh Postnatal Depression Scale Total 0     After visit meds:  Allergies as of 01/17/2020   No Known Allergies     Medication List    STOP taking these medications   Doxylamine-Pyridoxine 10-10 MG  Tbec Commonly known as: Diclegis     TAKE these medications   fluticasone 50 MCG/ACT nasal spray Commonly known as: FLONASE Place 2 sprays into both nostrils daily.   ibuprofen 600 MG tablet Commonly known as: ADVIL Take 1 tablet (600 mg total) by mouth every 6 (six) hours.   Prenatal Formula 28-0.8-235 MG Caps Take 1 tablet by mouth daily.   triamcinolone cream 0.1 % Commonly known as: KENALOG Apply 1 application topically 2 (two) times daily.   valACYclovir 500 MG tablet Commonly known as: Valtrex Take 1 tablet (500 mg total) by mouth 2 (two) times daily.        Discharge home in stable condition Infant Feeding: Breast Infant Disposition:home with mother Discharge instruction: per After Visit Summary and Postpartum booklet. Activity: Advance as tolerated. Pelvic rest for 6 weeks.  Diet: routine diet Future Appointments: Future Appointments  Date Time Provider [place]  01/21/2020 11:10 AM Wilber Oliphant, MD [facility] [place]   Follow up Visit:   Please schedule this patient for a In person postpartum visit in 6 weeks with the following provider: MD. Additional Postpartum F/U: IUD placement?   High risk pregnancy complicated by:  TOLAC Delivery mode:  VBAC, Spontaneous  Anticipated Birth Control:  Unsure   01/17/2020 Hansel Feinstein, CNM  Attestation:  I confirm that I have verified the information documented in the resident's note and that I have also personally reperformed the physical exam and all medical decision making activities. . Patient was seen and examined by me also Agree with note Vitals stable Labs stable Fundus firm, lochia within normal limits Perineum healing Ext WNL Ready for discharge  Seabron Spates, CNM

## 2020-01-15 NOTE — H&P (Addendum)
OBSTETRIC ADMISSION HISTORY AND PHYSICAL  Chelsea Baldwin is a 29 y.o. female G35P2002 with IUP at [redacted]w[redacted]d by 9 wk [place] presenting for SOL. She reports +FMs, No LOF, no VB, no blurry vision, headaches or peripheral edema, and RUQ pain.  She plans on breast feeding. She request condoms for birth control. She received her prenatal care at [facility]   After presentation to the MAU for contractions, she was found to have an elevated BP to 141/101. She was admitted for IOL for gHTN.  Dating: By 9 wk [place] --->  Estimated Date of Delivery: 01/20/20  Sono:  @[redacted]w[redacted]d , CWD, normal anatomy, cephalic presentation,  4627O, 87% EFW, posterior placenta   Prenatal History/Complications: - hx VD followed by CS for NRFHT - obesity - elevated BP on admission w/o dx of HTN - hx ?HSV?  Past Medical History: Past Medical History:  Diagnosis Date  . Anemia of pregnancy 06/02/2018    Past Surgical History: Past Surgical History:  Procedure Laterality Date  . CESAREAN SECTION  2017   G2     Obstetrical History: OB History    Gravida  3   Para  2   Term  2   Preterm  0   AB  0   Living  2     SAB      TAB      Ectopic      Multiple      Live Births  2        Obstetric Comments  G2- Emergency due to bradycardia, 2017, delivered at [place] -- 6 pounds 3 ounces  G1- Boy, 2012, 6 pounds 1 ounce - Severe anemia in G1 pregnancy         Social History: Social History   Socioeconomic History  . Marital status: Single    Spouse name: Not on file  . Number of children: Not on file  . Years of education: Not on file  . Highest education level: Not on file  Occupational History  . Not on file  Tobacco Use  . Smoking status: Never Smoker  . Smokeless tobacco: Never Used  Substance and Sexual Activity  . Alcohol use: Not Currently  . Drug use: Yes    Types: Marijuana    Comment: LAST SMOKED- 35 WEEKS   . Sexual activity: Yes    Partners: Male  Other  Topics Concern  . Not on file  Social History Narrative   Lives with two children (one boy and one girl) and boyfriend Metallurgist    Social Determinants of Health   Financial Resource Strain:   . Difficulty of Paying Living Expenses:   Food Insecurity: No Food Insecurity  . Worried About Charity fundraiser in the Last Year: Never true  . Ran Out of Food in the Last Year: Never true  Transportation Needs: No Transportation Needs  . Lack of Transportation (Medical): No  . Lack of Transportation (Non-Medical): No  Physical Activity:   . Days of Exercise per Week:   . Minutes of Exercise per Session:   Stress:   . Feeling of Stress :   Social Connections:   . Frequency of Communication with Friends and Family:   . Frequency of Social Gatherings with Friends and Family:   . Attends Religious Services:   . Active Member of Clubs or Organizations:   . Attends Archivist Meetings:   Marland Kitchen Marital Status:     Family History: History reviewed.  No pertinent family history.  Allergies: No Known Allergies  Medications Prior to Admission  Medication Sig Dispense Refill Last Dose  . Prenatal Vit-Fe Fum-FA-Omega (PRENATAL FORMULA) 28-0.8-235 MG CAPS Take 1 tablet by mouth daily. 30 capsule  Past Week at Unknown time  . Doxylamine-Pyridoxine (DICLEGIS) 10-10 MG TBEC Take 1 tablet by mouth at bedtime as needed (nausea). (Patient not taking: Reported on 12/29/2019) 60 tablet 0   . fluticasone (FLONASE) 50 MCG/ACT nasal spray Place 2 sprays into both nostrils daily. (Patient not taking: Reported on 12/29/2019) 16 g 6   . triamcinolone cream (KENALOG) 0.1 % Apply 1 application topically 2 (two) times daily. (Patient not taking: Reported on 12/29/2019) 30 g 0   . valACYclovir (VALTREX) 500 MG tablet Take 1 tablet (500 mg total) by mouth 2 (two) times daily. 60 tablet 0      Review of Systems   All systems reviewed and negative except as stated in HPI  Blood pressure 125/87, pulse 93,  temperature 98.1 F (36.7 C), temperature source Oral, resp. rate 20, height 5\' 3"  (1.6 m), weight 112 kg, last menstrual period 04/08/2019, SpO2 100 %. General appearance: alert, cooperative and appears stated age Lungs: clear to auscultation bilaterally Heart: regular rate and rhythm Abdomen: soft, non-tender; bowel sounds normal Pelvic: No active lesions.  Cervical exam: 4/60/ballotable, cephalic by 04/10/2019 per MAU note Presentation: unsure Fetal monitoringBaseline: 145 bpm, Variability: Good {> 6 bpm) and Accelerations: Reactive  Occasional variable and early decels Uterine activity rare Dilation: 4 Effacement (%): 60 Station: Ballotable Exam by:: Dr [email or web address]   Prenatal labs: ABO, Rh: --/--/O POS (05/28 0715) Antibody: NEG (05/28 0715) Rubella: 6.11 (10/21 1210) RPR: Non Reactive (04/15 1547)  HBsAg: Negative (10/21 1210)  HIV: Non Reactive (04/15 1547)  GBS: Negative/-- (05/11 0940)  1 hr Glucola normal Genetic screening: neg quad screen Anatomy 10-16-1974 normal  Prenatal Transfer Tool  Maternal Diabetes: No Genetic Screening: Normal Maternal Ultrasounds/Referrals: Normal Fetal Ultrasounds or other Referrals:  None Maternal Substance Abuse:  Yes:  Type: Marijuana Significant Maternal Medications:  Meds include: Other: Valtrex Significant Maternal Lab Results: Group B Strep negative  Results for orders placed or performed during the hospital encounter of 01/15/20 (from the past 24 hour(s))  Protein / creatinine ratio, urine   Collection Time: 01/15/20  5:53 AM  Result Value Ref Range   Creatinine, Urine 52.86 mg/dL   Total Protein, Urine 6 mg/dL   Protein Creatinine Ratio 0.11 0.00 - 0.15 mg/mg[Cre]  CBC   Collection Time: 01/15/20  6:07 AM  Result Value Ref Range   WBC 12.0 (H) 4.0 - 10.5 K/uL   RBC 4.21 3.87 - 5.11 MIL/uL   Hemoglobin 11.8 (L) 12.0 - 15.0 g/dL   HCT 01/17/20 (L) 40.9 - 81.1 %   MCV 80.3 80.0 - 100.0 fL   MCH 28.0 26.0 - 34.0 pg   MCHC 34.9 30.0 - 36.0 g/dL    RDW 91.4 78.2 - 95.6 %   Platelets 315 150 - 400 K/uL   nRBC 0.0 0.0 - 0.2 %  Comprehensive metabolic panel   Collection Time: 01/15/20  6:07 AM  Result Value Ref Range   Sodium 133 (L) 135 - 145 mmol/L   Potassium 4.3 3.5 - 5.1 mmol/L   Chloride 105 98 - 111 mmol/L   CO2 20 (L) 22 - 32 mmol/L   Glucose, Bld 90 70 - 99 mg/dL   BUN 8 6 - 20 mg/dL   Creatinine, Ser 01/17/20 0.44 -  1.00 mg/dL   Calcium 8.8 (L) 8.9 - 10.3 mg/dL   Total Protein 6.5 6.5 - 8.1 g/dL   Albumin 2.6 (L) 3.5 - 5.0 g/dL   AST 18 15 - 41 U/L   ALT 17 0 - 44 U/L   Alkaline Phosphatase 154 (H) 38 - 126 U/L   Total Bilirubin 0.5 0.3 - 1.2 mg/dL   GFR calc non Af Amer >60 >60 mL/min   GFR calc Af Amer >60 >60 mL/min   Anion gap 8 5 - 15  SARS Coronavirus 2 by RT PCR (hospital order, performed in [facility] hospital lab) Nasopharyngeal Nasopharyngeal Swab   Collection Time: 01/15/20  7:07 AM   Specimen: Nasopharyngeal Swab  Result Value Ref Range   SARS Coronavirus 2 NEGATIVE NEGATIVE  Type and screen [facility]   Collection Time: 01/15/20  7:15 AM  Result Value Ref Range   ABO/RH(D) O POS    Antibody Screen NEG    Sample Expiration      01/18/2020,2359 Performed at [facility], [address] [place], [place] [address]   Results for orders placed or performed in visit on 01/14/20 (from the past 24 hour(s))  HSV 2 antibody, IgG   Collection Time: 01/14/20 11:59 AM  Result Value Ref Range   HSV 2 IgG, Type Spec 16.60 (H) 0.00 - 0.90 index    Patient Active Problem List   Diagnosis Date Noted  . Gestational hypertension 01/15/2020  . Labor and delivery, indication for care 01/15/2020  . 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 Previous cesarean delivery, antepartum 01/01/2020  . Abscess 09/30/2019  . Low back pain 08/25/2019  . Arm numbness left 08/25/2019  . Obesity affecting pregnancy 07/23/2019  . Supervision of normal pregnancy 07/09/2019  . 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 G3P2002 at [redacted]w[redacted]d here for IOL for   #Labor:Plan to start Pit, AROM once safe and feasible #Pain: IV pain meds/nitrous PRN. Epidural upon request #FWB: Cat II, overall reassuring. 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 PIH labs WNL. continue to monitor Bps, not yet 4hrs apart.  Mirian Mo, MD  01/15/2020, 8:46 AM   OB FELLOW ATTESTATION  I have seen and examined this patient and edited the above documentation in the resident's note to reflect any changes or updates.  Zack Seal, MD/MPH OB Fellow  01/15/2020, 10:21 AM

## 2020-01-16 LAB — CBC
HCT: 30.6 % — ABNORMAL LOW (ref 36.0–46.0)
Hemoglobin: 10.7 g/dL — ABNORMAL LOW (ref 12.0–15.0)
MCH: 28.3 pg (ref 26.0–34.0)
MCHC: 35 g/dL (ref 30.0–36.0)
MCV: 81 fL (ref 80.0–100.0)
Platelets: 277 10*3/uL (ref 150–400)
RBC: 3.78 MIL/uL — ABNORMAL LOW (ref 3.87–5.11)
RDW: 13.3 % (ref 11.5–15.5)
WBC: 23.5 10*3/uL — ABNORMAL HIGH (ref 4.0–10.5)
nRBC: 0 % (ref 0.0–0.2)

## 2020-01-16 NOTE — Clinical Social Work Maternal (Signed)
CLINICAL SOCIAL WORK MATERNAL/CHILD NOTE  Patient Details  Name: Chelsea Baldwin MRN: [id number] Date of Birth: 03/01/1991  Date:  01/16/2020  Clinical Social Worker Initiating Note:  Georgeanne Nim, MSW, LCSWA Date/Time: Initiated:  01/16/20/1130     Child's Name:  Chelsea Baldwin.   Biological Parents:  Mother, Father(FOB, Chelsea Baldwin, 03/02/1993)   Need for Interpreter:  None   Reason for Referral:  Current Substance Use/Substance Use During Pregnancy    Address:  [place] [address]    Phone number:  [phone] (home)     Additional phone number: none offered  Household Members/Support Persons (HM/SP):   Household Member/Support Person 1, Household Member/Support Person 2, Household Member/Support Person 3   HM/SP Name Relationship DOB or Age  HM/SP -1 Chelsea Baldwin FOB 03/02/1993  HM/SP -2 Chelsea Baldwin. son 02/02/2011  HM/SP -3 Chelsea Baldwin daughter 02/10/2016  HM/SP -4        HM/SP -5        HM/SP -6        HM/SP -7        HM/SP -8          Natural Supports (not living in the home):  Immediate Family, Artist Supports: None   Employment: Unemployed   Type of Work:     Education:  Bartholomew arranged:    Museum/gallery curator Resources:  [employer]   Other Resources:  Physicist, medical , [place] Considerations Which May Impact Care:  none stated  Strengths:  Ability to meet basic needs , Engineer, materials, Home prepared for child    Psychotropic Medications:         Pediatrician:    [place] area  Pediatrician List:   [place]  [place]      Pediatrician Fax Number:    Risk Factors/Current Problems:  None   Cognitive State:  Able to Concentrate , Alert    Mood/Affect:  Bright , Happy , Interested    CSW Assessment: CSW received consult for drug exposed newborn.   CSW met with [name] at bedside to offer support and complete assessment. On arrival, CSW introduced self and stated purpose of visit. FOB and infant Chelsea Baldwin were present, however, after PPD/A and SIDS education, FOB stepped out of room to offer [name] privacy during assessment. [name] and FOB were pleasant and asked appropriate questions during visit.   CSW provided education regarding the baby blues period vs. perinatal mood disorders, discussed treatment and gave resources for mental health follow up if concerns arise.  CSW recommends self-evaluation during the postpartum time period using the New Mom Checklist from Postpartum Progress and encouraged [name] and FOB to contact a medical professional if symptoms are noted at any time.  [name] and FOB agreed and denied any questions or concerns. [name] denied any history of postpartum depression or anxiety with two older children.    CSW provided review of Sudden Infant Death Syndrome (SIDS) precautions. [name] and FOB denied any questions and confirmed having all needing items for infant including car seat and bassinet/pack and play for infant's safe sleep.   During assessment, [name] reported occasional marijuana using during pregnancy to address weight-loss and need for increased appetite. [name] reported she discontinued use at 34 weeks. [name] stated she does not plan to restart use due to  breastfeeding infant. [name] denies any other substance use history. CSW informed [name] of hospital's infant drug screen policy, infant's pending UDS and CDS results, and CPS report if warranted. [name] denied any questions and stated understanding.  [name] denied any CPS history pertaining to older children and stated both children are in her home.  [name] denied any BH dx, medications, or history. [name] denied any SI, HI, or domestic violence. [name] identified FOB, mom, and two sisters as support. [name] identified current mood as "pretty happy and glad everything is done and he is finally here." [name] denied any needs or  concerns and stated she feels well supported.    CSW identifies no further need for intervention, or barriers for discharge at this time. However, CSW will monitor infant's  UDS and CDS results, and contact CPS if warranted.   CSW Plan/Description:  Sudden Infant Death Syndrome (SIDS) Education, Perinatal Mood and Anxiety Disorder (PMADs) Education, Baldwin, CSW Will Continue to Monitor Umbilical Cord Tissue Drug Screen Results and Make Report if Warranted    Muriah Harsha D. Lissa Morales, MSW, [facility] Clinical Social Worker [phone] 01/16/2020, 12:44 PM

## 2020-01-16 NOTE — Lactation Note (Addendum)
This note was copied from a baby's chart. Lactation Consultation Note  Patient Name: Chelsea Baldwin Today's Date: 01/16/2020  baby Chelsea Chelsea Baldwin now 52 hours old with Minimal weight loss and good voids and poops. Times not documented on most feeds, but ones documented today are mostly 10 minutes. Parents report he is breastfeeding well and have no concerns at this time.  Mom reports she would like to breastfeed for a year but no longer than that.  Dad reports he hopes for her to be able to stay home and not go back to work because going back with her second baby made it harder to keep breastfeeding with the second one.  Urged to call lactation as needed. Praised breastfeeding     Maternal Data    Feeding Feeding Type: Breast Fed  LATCH Score                   Interventions    Lactation Tools Discussed/Used     Consult Status      Voncille Simm Michaelle Copas 01/16/2020, 7:17 PM

## 2020-01-16 NOTE — Progress Notes (Addendum)
POSTPARTUM PROGRESS NOTE  Post Partum Day 1 Subjective:  Chelsea Baldwin is a 29 y.o. [id number] 39w2ds/p NSVD.  No acute events overnight.  Pt denies problems with ambulating, voiding or po intake.  She denies nausea or vomiting.  Pain is well controlled.  She has not had flatus. She has not had bowel movement.  Lochia Moderate.   Objective: Blood pressure (!) 103/57, pulse 86, temperature 98.3 F (36.8 C), temperature source Oral, resp. rate 17, height _0  (1.6 m), weight 112 kg, last menstrual period 04/08/2019, SpO2 100 %, unknown if currently breastfeeding.  Physical Exam:  General: alert, cooperative and no distress Lochia:normal flow Chest: CTAB Heart: RRR no m/r/g Abdomen: +BS, soft, nontender,  Uterine Fundus: firm, below umbilicus DVT Evaluation: No calf swelling or tenderness Extremities: no edema  Recent Labs    01/15/20 0607 01/16/20 0505  HGB 11.8* 10.7*  HCT 33.8* 30.6*    Assessment/Plan:  ASSESSMENT: Chelsea Corronis a 29y.o. [id number]/p NSVD  Plan for discharge tomorrow, Breastfeeding and Circumcision prior to discharge   LOS: 1 day   PeMatilde HaymakerMD 01/16/2020, 7:39 AM   I saw and evaluated the patient. I agree with the findings and the plan of care as documented in the resident's note. Has not yet met criteria for gHTN. No elevated BP's since first 2 hours of admission.   ChBarrington EllisonMD OBHss [facility] Surgery Centeramily Medicine Fellow, FaAssurance Health Hudson LLCor WoDean Foods CompanyCoVancouver

## 2020-01-16 NOTE — Lactation Note (Signed)
This note was copied from a baby's chart. Lactation Consultation Note  Patient Name: Chelsea Baldwin Today's Date: 01/16/2020 Reason for consult: Initial assessment;Term P3, 9 hour term female infant. Per mom, she feels breastfeeding is going well she doesn't have any questions or concerns.  Mom was breastfeeding infant as LC entered the room. Mom had infant latched on her right breast using the cradle hold, infant latch well deep, per mom she only feels a tug, infant was still BF after 15 minutes.  Mom knows to breastfeed infant according to cues, on demand  8 to 12 times within 24 hours and not exceed 3 hours without feeding infant Mom will continue to do STS with infant. Reviewed Baby & Me book's Breastfeeding Basics.  Mom made aware of O/P services, breastfeeding support groups, community resources, and our phone # for post-discharge questions. .   Maternal Data Formula Feeding for Exclusion: No Has patient been taught Hand Expression?: Yes Does the patient have breastfeeding experience prior to this delivery?: Yes  Feeding Feeding Type: Breast Fed  LATCH Score Latch: Grasps breast easily, tongue down, lips flanged, rhythmical sucking.  Audible Swallowing: Spontaneous and intermittent  Type of Nipple: Everted at rest and after stimulation  Comfort (Breast/Nipple): Soft / non-tender  Hold (Positioning): No assistance needed to correctly position infant at breast.  LATCH Score: 10  Interventions Interventions: Breast feeding basics reviewed;Breast compression;Skin to skin;Expressed milk;Position options  Lactation Tools Discussed/Used WIC Program: Yes   Consult Status Consult Status: Follow-up Date: 01/16/20 Follow-up type: In-patient    Danelle Earthly 01/16/2020, 4:03 AM

## 2020-01-17 MED ORDER — IBUPROFEN 600 MG PO TABS: 600.0000 mg | ORAL_TABLET | Freq: Four times a day (QID) | ORAL | 0 refills | Status: DC

## 2020-01-17 NOTE — Lactation Note (Signed)
This note was copied from a baby's chart. Lactation Consultation Note  Patient Name: Chelsea Baldwin [id number] Date: 01/17/2020 Reason for consult: Follow-up assessment  P3 mother whose infant is now 58 hours old.  This is a term baby at 39+2 weeks.  Mother breast fed her first two children without any difficulty.  Baby was swaddled and asleep in mother's arms when I arrived.  Family was awaiting discharge.  Mother had no questions/concerns related to breast feeding.  She is familiar with engorgement prevention/treatment and did not wish to obtain a manual pump.  She has a DEBP for home use.    Mother will call as needed for questions/concerns after discharge.  Father present.   Maternal Data    Feeding    LATCH Score                   Interventions    Lactation Tools Discussed/Used     Consult Status Consult Status: Complete Date: 01/18/20 Follow-up type: In-patient    Brynlynn Walko R Bransyn Adami 01/17/2020, 1:04 PM

## 2020-01-21 ENCOUNTER — Encounter: Payer: Medicaid Other | Admitting: Family Medicine

## 2020-02-12 ENCOUNTER — Ambulatory Visit (INDEPENDENT_AMBULATORY_CARE_PROVIDER_SITE_OTHER): Payer: Medicaid Other | Admitting: Family Medicine

## 2020-02-12 ENCOUNTER — Other Ambulatory Visit: Payer: Self-pay

## 2020-02-12 ENCOUNTER — Ambulatory Visit: Payer: Medicaid Other | Admitting: Family Medicine

## 2020-02-12 VITALS — BP 98/70 | HR 71 | Wt 232.0 lb

## 2020-02-12 DIAGNOSIS — N898 Other specified noninflammatory disorders of vagina: Secondary | ICD-10-CM

## 2020-02-12 LAB — POCT WET PREP (WET MOUNT)
Clue Cells Wet Prep Whiff POC: NEGATIVE
Trichomonas Wet Prep HPF POC: ABSENT

## 2020-02-12 MED ORDER — METRONIDAZOLE 0.75 % EX GEL: 1.0000 "application " | Freq: Two times a day (BID) | CUTANEOUS | 0 refills | Status: DC

## 2020-02-12 MED ORDER — METRONIDAZOLE 500 MG PO TABS
500.0000 mg | ORAL_TABLET | Freq: Two times a day (BID) | ORAL | 0 refills | Status: DC
Start: 2020-02-12 — End: 2020-02-12

## 2020-02-12 MED ORDER — FLUCONAZOLE 150 MG PO TABS
150.0000 mg | ORAL_TABLET | Freq: Once | ORAL | 0 refills | Status: DC
Start: 2020-02-12 — End: 2020-02-12

## 2020-02-12 NOTE — Patient Instructions (Addendum)
It was great to meet you today! Thank you for letting me participate in your care!  Today, we discussed your symptoms and you tested negative for bacterial vaginosis. However, sometimes you can test negative but still have an infection. I am sending in an antibiotic gel in case your symptoms don't resolve on their own. Please take them as prescribed.  Be well, Jules Schick, DO PGY-3, [facility]   Mediterranean Diet A Mediterranean diet refers to food and lifestyle choices that are based on the traditions of countries located on the [employer]. This way of eating has been shown to help prevent certain conditions and improve outcomes for people who have chronic diseases, like kidney disease and heart disease. What are tips for following this plan? Lifestyle  Cook and eat meals together with your family, when possible.  Drink enough fluid to keep your urine clear or pale yellow.  Be physically active every day. This includes: ? Aerobic exercise like running or swimming. ? Leisure activities like gardening, walking, or housework.  Get 7-8 hours of sleep each night.  If recommended by your health care provider, drink red wine in moderation. This means 1 glass a day for nonpregnant women and 2 glasses a day for men. A glass of wine equals 5 oz (150 mL). Reading food labels   Check the serving size of packaged foods. For foods such as rice and pasta, the serving size refers to the amount of cooked product, not dry.  Check the total fat in packaged foods. Avoid foods that have saturated fat or trans fats.  Check the ingredients list for added sugars, such as corn syrup. Shopping  At the grocery store, buy most of your food from the areas near the walls of the store. This includes: ? Fresh fruits and vegetables (produce). ? Grains, beans, nuts, and seeds. Some of these may be available in unpackaged forms or large amounts (in bulk). ? Fresh seafood. ? Poultry and  eggs. ? Low-fat dairy products.  Buy whole ingredients instead of prepackaged foods.  Buy fresh fruits and vegetables in-season from local farmers markets.  Buy frozen fruits and vegetables in resealable bags.  If you do not have access to quality fresh seafood, buy precooked frozen shrimp or canned fish, such as tuna, salmon, or sardines.  Buy small amounts of raw or cooked vegetables, salads, or olives from the deli or salad bar at your store.  Stock your pantry so you always have certain foods on hand, such as olive oil, canned tuna, canned tomatoes, rice, pasta, and beans. Cooking  Cook foods with extra-virgin olive oil instead of using butter or other vegetable oils.  Have meat as a side dish, and have vegetables or grains as your main dish. This means having meat in small portions or adding small amounts of meat to foods like pasta or stew.  Use beans or vegetables instead of meat in common dishes like chili or lasagna.  Experiment with different cooking methods. Try roasting or broiling vegetables instead of steaming or sauteing them.  Add frozen vegetables to soups, stews, pasta, or rice.  Add nuts or seeds for added healthy fat at each meal. You can add these to yogurt, salads, or vegetable dishes.  Marinate fish or vegetables using olive oil, lemon juice, garlic, and fresh herbs. Meal planning   Plan to eat 1 vegetarian meal one day each week. Try to work up to 2 vegetarian meals, if possible.  Eat seafood 2 or  more times a week.  Have healthy snacks readily available, such as: ? Vegetable sticks with hummus. ? [place] yogurt. ? Fruit and nut trail mix.  Eat balanced meals throughout the week. This includes: ? Fruit: 2-3 servings a day ? Vegetables: 4-5 servings a day ? Low-fat dairy: 2 servings a day ? Fish, poultry, or lean meat: 1 serving a day ? Beans and legumes: 2 or more servings a week ? Nuts and seeds: 1-2 servings a day ? Whole grains: 6-8 servings a  day ? Extra-virgin olive oil: 3-4 servings a day  Limit red meat and sweets to only a few servings a month What are my food choices?  Mediterranean diet ? Recommended  Grains: Whole-grain pasta. Brown rice. Bulgar wheat. Polenta. Couscous. Whole-wheat bread. [place].  Vegetables: Artichokes. Beets. Broccoli. Cabbage. Carrots. Eggplant. Green beans. Chard. Kale. Spinach. Onions. Leeks. Peas. Squash. Tomatoes. Peppers. Radishes.  Fruits: Apples. Apricots. Avocado. Berries. Bananas. Cherries. Dates. Figs. Grapes. Lemons. Melon. Oranges. Peaches. Plums. Pomegranate.  Meats and other protein foods: Beans. Almonds. Sunflower seeds. Pine nuts. Peanuts. [place]. Salmon. Scallops. Shrimp. [place]. Tilapia. Clams. Oysters. Eggs.  Dairy: Low-fat milk. Cheese. Greek yogurt.  Beverages: Water. Red wine. Herbal tea.  Fats and oils: Extra virgin olive oil. Avocado oil. Grape seed oil.  Sweets and desserts: [place] yogurt with honey. Baked apples. Poached pears. Trail mix.  Seasoning and other foods: Basil. Cilantro. Coriander. Cumin. Mint. Parsley. Sage. Rosemary. Tarragon. Garlic. Oregano. Thyme. Pepper. Balsalmic vinegar. Tahini. Hummus. Tomato sauce. Olives. Mushrooms. ? Limit these  Grains: Prepackaged pasta or rice dishes. Prepackaged cereal with added sugar.  Vegetables: Deep fried potatoes (french fries).  Fruits: Fruit canned in syrup.  Meats and other protein foods: Beef. Pork. Lamb. Poultry with skin. Hot dogs. Berniece Salines.  Dairy: Ice cream. Sour cream. Whole milk.  Beverages: Juice. Sugar-sweetened soft drinks. Beer. Liquor and spirits.  Fats and oils: Butter. Canola oil. Vegetable oil. Beef fat (tallow). Lard.  Sweets and desserts: Cookies. Cakes. Pies. Candy.  Seasoning and other foods: Mayonnaise. Premade sauces and marinades. The items listed may not be a complete list. Talk with your dietitian about what dietary choices are right for you. Summary  The Mediterranean diet  includes both food and lifestyle choices.  Eat a variety of fresh fruits and vegetables, beans, nuts, seeds, and whole grains.  Limit the amount of red meat and sweets that you eat.  Talk with your health care provider about whether it is safe for you to drink red wine in moderation. This means 1 glass a day for nonpregnant women and 2 glasses a day for men. A glass of wine equals 5 oz (150 mL). This information is not intended to replace advice given to you by your health care provider. Make sure you discuss any questions you have with your health care provider. Document Revised: 04/05/2016 Document Reviewed: 03/29/2016 Elsevier Patient Education  [place].

## 2020-02-12 NOTE — Assessment & Plan Note (Signed)
Wet prep negative but given I did see copious amounts of whitish-grey discharge on exam and patient with previous BV complains of "fishy odor" I will treat for BV. Patient is breast feeding so giving gel instead of oral medication. - Metronidazole gel BID for 7 days

## 2020-02-12 NOTE — Progress Notes (Signed)
    SUBJECTIVE:   CHIEF COMPLAINT / HPI:   Vaginal Discharge Patient reports a "fishy" odor and some mild discharge going on for several days. She has had BV in the past and thinks it might be BV again. She also requested I check to ensure her sutures have dissolved as she had them placed after her vaginal delivery. She thinks it may be causing some mild irritation. No fevers, chills, abdominal or pelvic pain.  PERTINENT  PMH / PSH: gHTN  OBJECTIVE:   BP 98/70   Pulse 71   Wt 232 lb (105.2 kg)   LMP 04/08/2019   SpO2 99%   BMI 41.10 kg/m   Gen: NAD Physical Exam Vitals reviewed. Exam conducted with a chaperone present.  Genitourinary:    Exam position: Supine.     Labia:        Right: No rash, tenderness, lesion or injury.        Left: No rash, tenderness, lesion or injury.      Vagina: No signs of injury. Vaginal discharge present. No erythema, tenderness or bleeding.     Cervix: No discharge, lesion, erythema or cervical bleeding.      ASSESSMENT/PLAN:   Vaginal discharge Wet prep negative but given I did see copious amounts of whitish-grey discharge on exam and patient with previous BV complains of "fishy odor" I will treat for BV. Patient is breast feeding so giving gel instead of oral medication. - Metronidazole gel BID for 7 days     Arlyce Harman, DO [facility]

## 2020-04-22 ENCOUNTER — Encounter: Payer: Self-pay | Admitting: General Practice

## 2020-04-27 DIAGNOSIS — R0981 Nasal congestion: Secondary | ICD-10-CM | POA: Diagnosis not present

## 2020-04-27 DIAGNOSIS — R05 Cough: Secondary | ICD-10-CM | POA: Diagnosis not present

## 2020-04-27 DIAGNOSIS — Z20822 Contact with and (suspected) exposure to covid-19: Secondary | ICD-10-CM | POA: Diagnosis not present

## 2020-10-03 DIAGNOSIS — F411 Generalized anxiety disorder: Secondary | ICD-10-CM | POA: Diagnosis not present

## 2020-10-04 DIAGNOSIS — F411 Generalized anxiety disorder: Secondary | ICD-10-CM | POA: Diagnosis not present

## 2020-10-10 ENCOUNTER — Encounter: Payer: Self-pay | Admitting: Family Medicine

## 2020-10-10 ENCOUNTER — Ambulatory Visit (INDEPENDENT_AMBULATORY_CARE_PROVIDER_SITE_OTHER): Payer: Medicaid Other | Admitting: Family Medicine

## 2020-10-10 ENCOUNTER — Other Ambulatory Visit (HOSPITAL_COMMUNITY)
Admission: RE | Admit: 2020-10-10 | Discharge: 2020-10-10 | Disposition: A | Discharge status: Home / Self Care | Payer: Medicaid Other | Source: Ambulatory Visit | Attending: Family Medicine | Admitting: Family Medicine

## 2020-10-10 ENCOUNTER — Other Ambulatory Visit: Payer: Self-pay

## 2020-10-10 VITALS — BP 113/71 | HR 66 | Ht 63.0 in | Wt 225.8 lb

## 2020-10-10 DIAGNOSIS — Z113 Encounter for screening for infections with a predominantly sexual mode of transmission: Secondary | ICD-10-CM | POA: Diagnosis not present

## 2020-10-10 DIAGNOSIS — Z23 Encounter for immunization: Secondary | ICD-10-CM | POA: Diagnosis not present

## 2020-10-10 DIAGNOSIS — D619 Aplastic anemia, unspecified: Secondary | ICD-10-CM

## 2020-10-10 DIAGNOSIS — R5383 Other fatigue: Secondary | ICD-10-CM

## 2020-10-10 DIAGNOSIS — D649 Anemia, unspecified: Secondary | ICD-10-CM

## 2020-10-10 DIAGNOSIS — Z1159 Encounter for screening for other viral diseases: Secondary | ICD-10-CM | POA: Diagnosis not present

## 2020-10-10 DIAGNOSIS — Z124 Encounter for screening for malignant neoplasm of cervix: Secondary | ICD-10-CM | POA: Insufficient documentation

## 2020-10-10 DIAGNOSIS — B373 Candidiasis of vulva and vagina: Secondary | ICD-10-CM | POA: Insufficient documentation

## 2020-10-10 NOTE — Patient Instructions (Signed)
It was a pleasure seeing you today.  We completed your Pap smear, depending on those results we will determine when your next one will be if everything is normal you will need a repeat Pap smear in 3 years.  We also tested for STDs as well as bacterial vaginosis and yeast.  I will call you with those results if there are any abnormalities and can call in any medications you need.  Regarding your fatigue I will go ahead and check your blood levels.  I recommend you take daily iron which she can get over-the-counter.  Regarding birth control if you decide that you want Depo please call the clinic and we can schedule an appointment for reinitiation of Depo.  I hope you have a wonderful afternoon!

## 2020-10-10 NOTE — Progress Notes (Signed)
    SUBJECTIVE:   CHIEF COMPLAINT / HPI:   PAP Patient reports that she knows it is time for her Pap smear.  Last Pap was 3 years.  Denies any abnormal bleeding, pain, discharge.  Essentially negative.  Is not currently on birth control.  Birth control counseling Patient reports that she is not currently on birth control but is considering restarting.  She has tried the pills in the past but is breast-feeding and reports that pills are not the best option for her.  She is considering IUD versus Nexplanon versus Depo.  She has had a Nexplanon in the past and liked it but is most likely going to choose Depo-Provera.  She will call and schedule an appointment she makes a decision on this.  Fatigue Patient reports worsening fatigue over the last month or 2.  She says that she has felt like this for which she had issues with her hemoglobin.  She thinks she may be having anemia again and has not been taking supplementation.  Recommended that she take prenatal vitamins and she may need further iron supplementation if her hemoglobin was too low.   OBJECTIVE:   BP 113/71   Pulse 66   Ht 5\' 3"  (1.6 m)   Wt 225 lb 12.8 oz (102.4 kg)   LMP 09/28/2020   SpO2 99%   Breastfeeding Yes   BMI 40.00 kg/m   General: Well-appearing 30 year old female, no acute distress Cardiac: Regular rate and rhythm, no murmur appreciated Respiratory: Normal breathing, lungs clear to auscultation bilateral Abdomen: Soft, nontender, positive bowel sounds GU: Normal external vaginal exam, normal internal vaginal tissue, cervix nonfriable, moderate amount of discharge. ASSESSMENT/PLAN:   Screening for cervical cancer Pap smear clinic today.  Also tested for STIs as well as BV yeast -We will call patient with results when they come back, will call in medication if needed.  Anemia Obtaining CBC today.  Recommended she take prenatal vitamins     37, MD [facility]

## 2020-10-11 LAB — CERVICOVAGINAL ANCILLARY ONLY
Bacterial Vaginitis (gardnerella): POSITIVE — AB
Candida Glabrata: NEGATIVE
Candida Vaginitis: NEGATIVE
Chlamydia: NEGATIVE
Comment: NEGATIVE
Comment: NEGATIVE
Comment: NEGATIVE
Comment: NEGATIVE
Comment: NEGATIVE
Comment: NORMAL
Neisseria Gonorrhea: NEGATIVE
Trichomonas: NEGATIVE

## 2020-10-11 LAB — CBC
Hematocrit: 37.3 % (ref 34.0–46.6)
Hemoglobin: 12.5 g/dL (ref 11.1–15.9)
MCH: 27.7 pg (ref 26.6–33.0)
MCHC: 33.5 g/dL (ref 31.5–35.7)
MCV: 83 fL (ref 79–97)
Platelets: 308 10*3/uL (ref 150–450)
RBC: 4.52 x10E6/uL (ref 3.77–5.28)
RDW: 14.9 % (ref 11.7–15.4)
WBC: 7.1 10*3/uL (ref 3.4–10.8)

## 2020-10-11 LAB — HEPATITIS C ANTIBODY: Hep C Virus Ab: <0.1 s/co ratio (ref 0.0–0.9)

## 2020-10-12 ENCOUNTER — Telehealth: Payer: Self-pay | Admitting: Family Medicine

## 2020-10-12 DIAGNOSIS — Z124 Encounter for screening for malignant neoplasm of cervix: Secondary | ICD-10-CM | POA: Insufficient documentation

## 2020-10-12 HISTORY — DX: Encounter for screening for malignant neoplasm of cervix: Z12.4

## 2020-10-12 LAB — CYTOLOGY - PAP: Diagnosis: NEGATIVE

## 2020-10-12 MED ORDER — METRONIDAZOLE 0.75 % VA GEL: 1.0000 | Freq: Two times a day (BID) | VAGINAL | 0 refills | Status: DC

## 2020-10-12 NOTE — Assessment & Plan Note (Addendum)
Obtaining CBC today.  Recommended she take prenatal vitamins

## 2020-10-12 NOTE — Telephone Encounter (Signed)
Attempted to call patient regarding positive BV.  Sent prescription of MetroGel to her pharmacy.  Me or my team will reach out again to inform her of these results and that the prescription has been sent.

## 2020-10-12 NOTE — Telephone Encounter (Signed)
LVM asking for a return call to the family medicine center. If pt calls, please give her the information below. Sunday Spillers, CMA

## 2020-10-12 NOTE — Assessment & Plan Note (Signed)
Pap smear clinic today.  Also tested for STIs as well as BV yeast -We will call patient with results when they come back, will call in medication if needed.

## 2020-10-18 DIAGNOSIS — F411 Generalized anxiety disorder: Secondary | ICD-10-CM | POA: Diagnosis not present

## 2020-10-25 DIAGNOSIS — F411 Generalized anxiety disorder: Secondary | ICD-10-CM | POA: Diagnosis not present

## 2020-11-02 DIAGNOSIS — F411 Generalized anxiety disorder: Secondary | ICD-10-CM | POA: Diagnosis not present

## 2020-11-10 DIAGNOSIS — F411 Generalized anxiety disorder: Secondary | ICD-10-CM | POA: Diagnosis not present

## 2020-11-15 DIAGNOSIS — F411 Generalized anxiety disorder: Secondary | ICD-10-CM | POA: Diagnosis not present

## 2020-11-24 DIAGNOSIS — F411 Generalized anxiety disorder: Secondary | ICD-10-CM | POA: Diagnosis not present

## 2020-12-02 DIAGNOSIS — F411 Generalized anxiety disorder: Secondary | ICD-10-CM | POA: Diagnosis not present

## 2020-12-07 DIAGNOSIS — F411 Generalized anxiety disorder: Secondary | ICD-10-CM | POA: Diagnosis not present

## 2020-12-16 DIAGNOSIS — F411 Generalized anxiety disorder: Secondary | ICD-10-CM | POA: Diagnosis not present

## 2020-12-22 DIAGNOSIS — F411 Generalized anxiety disorder: Secondary | ICD-10-CM | POA: Diagnosis not present

## 2020-12-26 DIAGNOSIS — F411 Generalized anxiety disorder: Secondary | ICD-10-CM | POA: Diagnosis not present

## 2021-01-21 DIAGNOSIS — F411 Generalized anxiety disorder: Secondary | ICD-10-CM | POA: Diagnosis not present

## 2021-01-23 DIAGNOSIS — F411 Generalized anxiety disorder: Secondary | ICD-10-CM | POA: Diagnosis not present

## 2021-02-04 DIAGNOSIS — F411 Generalized anxiety disorder: Secondary | ICD-10-CM | POA: Diagnosis not present

## 2021-02-06 DIAGNOSIS — F411 Generalized anxiety disorder: Secondary | ICD-10-CM | POA: Diagnosis not present

## 2021-02-07 DIAGNOSIS — Z23 Encounter for immunization: Secondary | ICD-10-CM

## 2021-02-07 DIAGNOSIS — Z111 Encounter for screening for respiratory tuberculosis: Secondary | ICD-10-CM | POA: Insufficient documentation

## 2021-02-07 HISTORY — DX: Encounter for immunization: Z23

## 2021-02-07 NOTE — Progress Notes (Signed)
Patient rescheduled for different time with different provider.  Peggyann Shoals, DO [facility], PGY-3 02/08/2021 10:21 AM

## 2021-02-08 ENCOUNTER — Ambulatory Visit (INDEPENDENT_AMBULATORY_CARE_PROVIDER_SITE_OTHER): Payer: Medicaid Other | Admitting: Student in an Organized Health Care Education/Training Program

## 2021-02-08 ENCOUNTER — Encounter: Payer: Self-pay | Admitting: Student in an Organized Health Care Education/Training Program

## 2021-02-08 ENCOUNTER — Ambulatory Visit (INDEPENDENT_AMBULATORY_CARE_PROVIDER_SITE_OTHER): Payer: Medicaid Other

## 2021-02-08 ENCOUNTER — Other Ambulatory Visit: Payer: Self-pay

## 2021-02-08 ENCOUNTER — Ambulatory Visit (INDEPENDENT_AMBULATORY_CARE_PROVIDER_SITE_OTHER): Payer: Medicaid Other | Admitting: Family Medicine

## 2021-02-08 DIAGNOSIS — Z23 Encounter for immunization: Secondary | ICD-10-CM | POA: Diagnosis not present

## 2021-02-08 DIAGNOSIS — Z111 Encounter for screening for respiratory tuberculosis: Secondary | ICD-10-CM | POA: Insufficient documentation

## 2021-02-08 HISTORY — DX: Encounter for immunization: Z23

## 2021-02-08 NOTE — Progress Notes (Signed)
   SUBJECTIVE:   CHIEF COMPLAINT / HPI: covid vaccine and TB screening  Covid vaccine- patient is starting a new job with [employer] and planning to go to nursing school so is starting her covid vaccine series. She is currently still doing some intermittent breastfeeding so has questions about the effect on breast milk and any other negative side effects to expect.   TB screening- also required to have PPD screening for school/work. Denies risk factors such as known exposure, incarceration, or foreign travel to endemic areas. No current symptoms.   OBJECTIVE:   BP 126/83   Pulse 99   Wt 206 lb (93.4 kg)   SpO2 100%   BMI 36.49 kg/m   Physical Exam Vitals and nursing note reviewed.  Constitutional:      General: She is not in acute distress.    Appearance: Normal appearance. She is not ill-appearing or toxic-appearing.  Cardiovascular:     Rate and Rhythm: Normal rate.  Pulmonary:     Effort: Pulmonary effort is normal. No respiratory distress.  Skin:    General: Skin is warm and dry.  Neurological:     General: No focal deficit present.     Mental Status: She is alert and oriented to person, place, and time.  Psychiatric:        Mood and Affect: Mood normal.        Behavior: Behavior normal.   ASSESSMENT/PLAN:   COVID-19 vaccine administered Counseling today regarding vaccine and administered first dose. Observation >11minutes after and tolerated well.  Return visit scheduled.   Tuberculosis screening PPD today- scheduled for nurse visit 48-72 hours later to assess.  Asymptomatic screening for school/work.     Leeroy Bock, DO [facility]

## 2021-02-08 NOTE — Assessment & Plan Note (Signed)
Counseling today regarding vaccine and administered first dose. Observation >57minutes after and tolerated well.  Return visit scheduled.

## 2021-02-08 NOTE — Assessment & Plan Note (Signed)
PPD today- scheduled for nurse visit 48-72 hours later to assess.  Asymptomatic screening for school/work.

## 2021-02-10 ENCOUNTER — Ambulatory Visit: Payer: Medicaid Other

## 2021-02-10 ENCOUNTER — Other Ambulatory Visit: Payer: Self-pay

## 2021-02-10 DIAGNOSIS — Z111 Encounter for screening for respiratory tuberculosis: Secondary | ICD-10-CM

## 2021-02-10 LAB — TB SKIN TEST
Induration: 0 mm
TB Skin Test: NEGATIVE

## 2021-02-10 NOTE — Progress Notes (Signed)
PPD Reading Note  PPD read and results entered in EpicCare.  Result: 0 mm induration.  Interpretation: negative  Allergic reaction: no

## 2021-02-10 NOTE — Addendum Note (Signed)
Addended by: Steva Colder on: 02/10/2021 10:08 AM   Modules accepted: Orders

## 2021-02-12 IMAGING — US US MFM OB FOLLOW-UP
1 series · 13 of 28 positions shown · non-contrast
Comparison: none

[Series 1: us mfm ob follow-up · 13 of 45 slices shown]
[im 2/45]
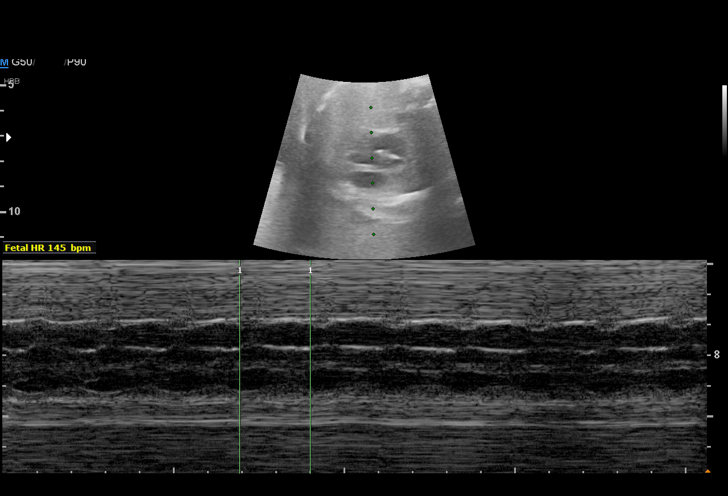
[im 5/45]
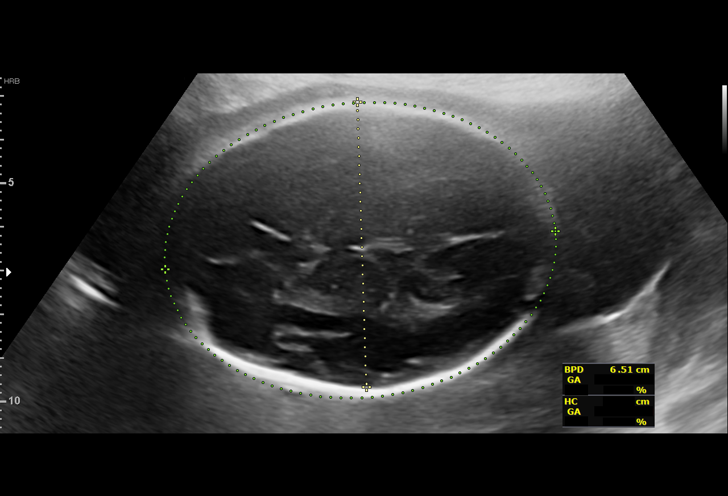
[im 9/45]
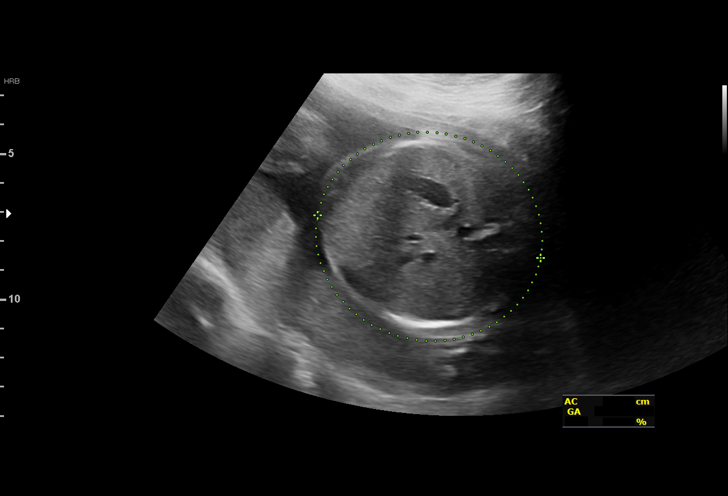
[im 12/45]
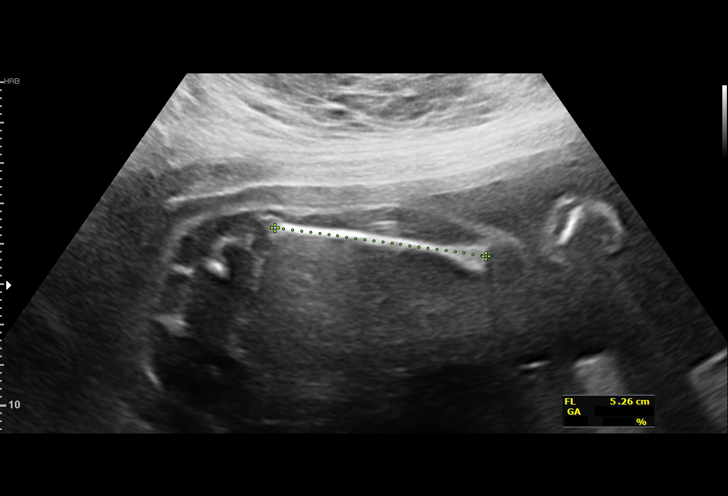
[im 15/45]
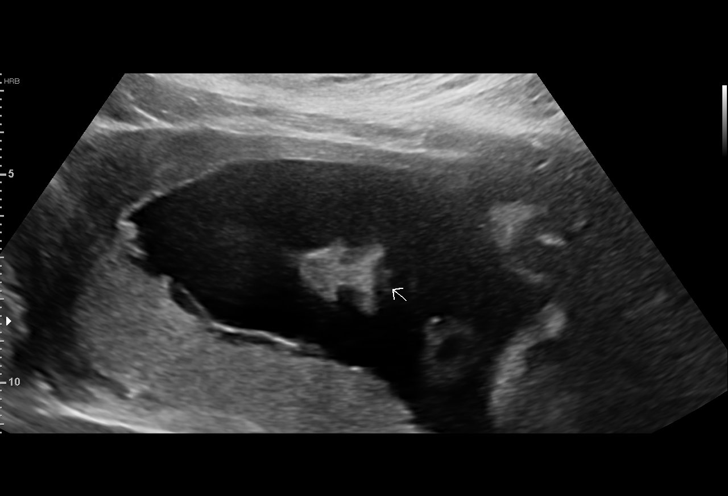
[im 18/45]
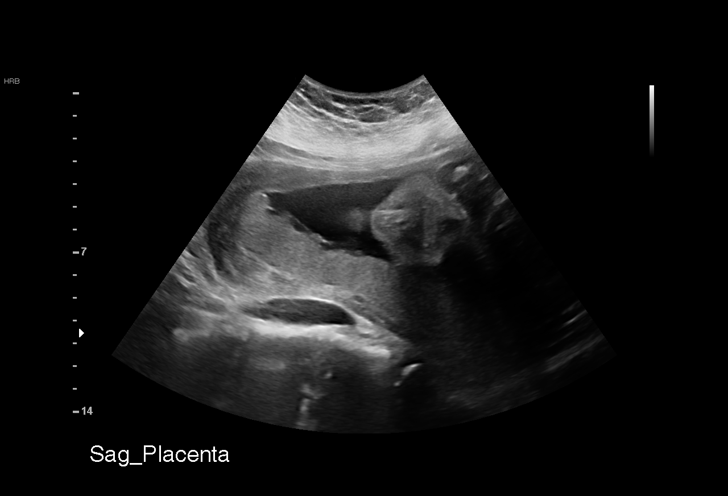
[im 23/45]
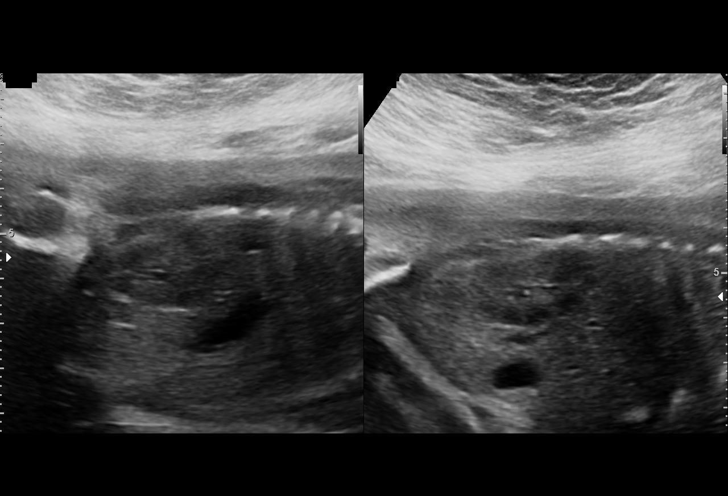
[im 27/45]
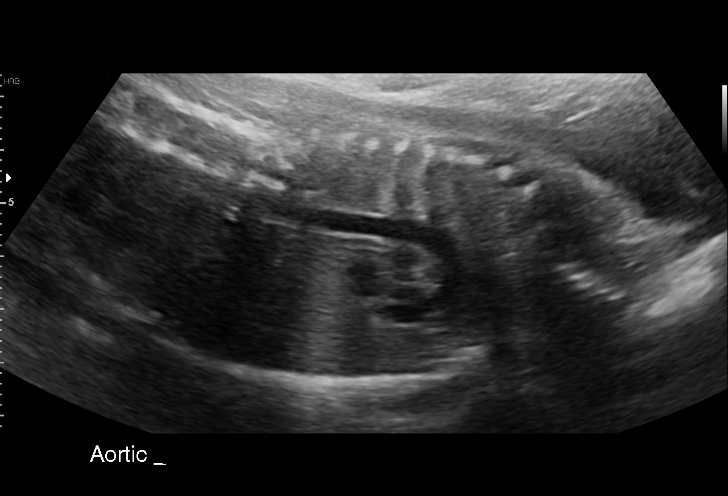
[im 30/45]
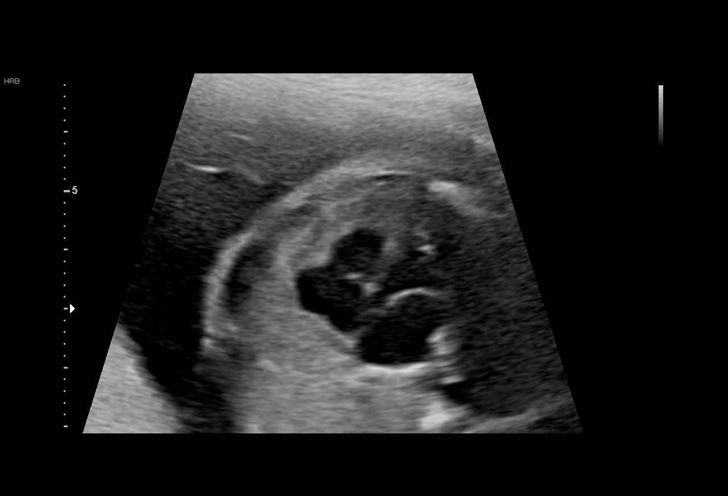
[im 33/45]
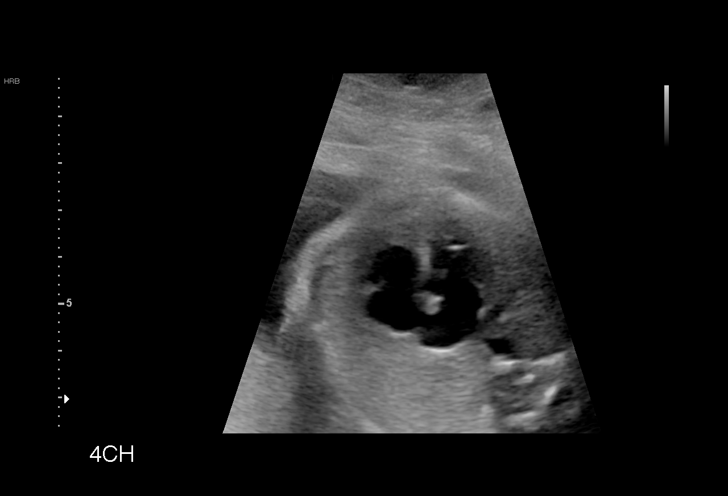
[im 36/45]
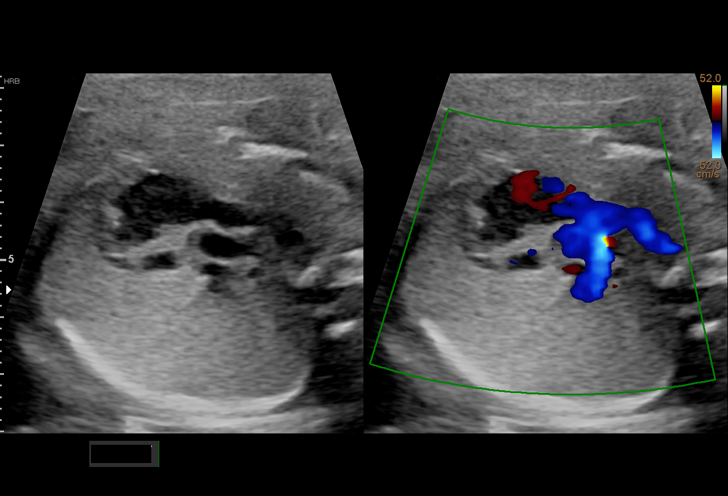
[im 40/45]
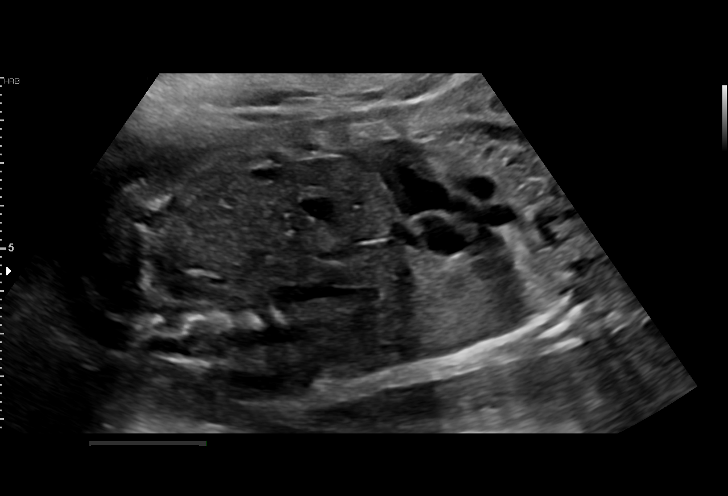
[im 43/45]
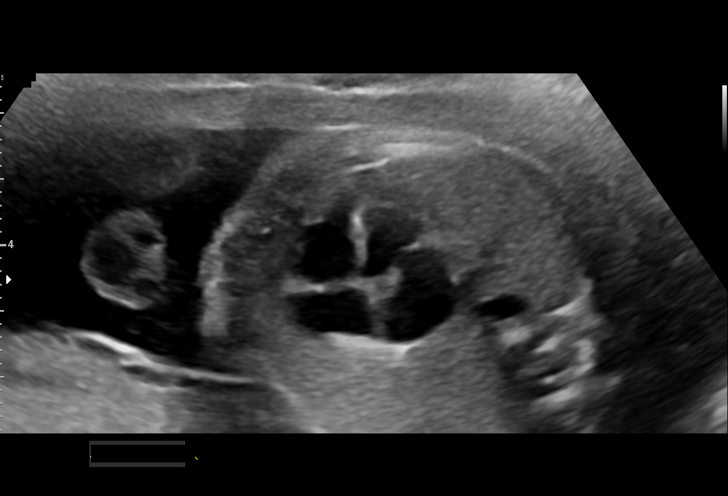

[13 of 28 positions shown; findings below may reference images not displayed]

Akari
                   BICHETTE RA

 ----------------------------------------------------------------------

 ----------------------------------------------------------------------
Indications

  Obesity complicating pregnancy, second
  trimester (BMI 42.51)
  Encounter for antenatal screening for
  malformations (no NIPS)
  Previous cesarean delivery, antepartum
  26 weeks gestation of pregnancy
  Echogenic intracardiac focus of the heart
  (EIF)
 ----------------------------------------------------------------------
Vital Signs

                                                Height:        5'3"
Fetal Evaluation

 Num Of Fetuses:         1
 Fetal Heart Rate(bpm):  145
 Cardiac Activity:       Observed
 Presentation:           Transverse, head to maternal left
 Placenta:               Posterior
 P. Cord Insertion:      Previously Visualized

 Amniotic Fluid
 AFI FV:      Within normal limits

                             Largest Pocket(cm)

Biometry

 BPD:      65.1  mm     G. Age:  26w 2d         21  %    CI:        70.25   %    70 - 86
                                                         FL/HC:      21.4   %    18.6 -
 HC:      247.7  mm     G. Age:  26w 6d         24  %    HC/AC:      1.06        1.05 -
 AC:      234.4  mm     G. Age:  27w 5d         70  %    FL/BPD:     81.6   %    71 - 87
 FL:       53.1  mm     G. Age:  28w 1d         75  %    FL/AC:      22.7   %    20 - 24

 Est. FW:    1110  gm      2 lb 7 oz     74  %
OB History

 Gravidity:    3         Term:   2        Prem:   0        SAB:   0
 TOP:          0       Ectopic:  0        Living: 2
Gestational Age

 U/S Today:     27w 2d                                        EDD:   01/17/20
 Best:          26w 6d     Det. By:  Early Ultrasound         EDD:   01/20/20
                                     (06/17/19)
Anatomy

 Cranium:               Appears normal         Aortic Arch:            Appears normal
 Cavum:                 Appears normal         Ductal Arch:            Not well visualized
 Ventricles:            Previously seen        Diaphragm:              Appears normal
 Choroid Plexus:        Previously seen        Stomach:                Appears normal, left
                                                                       sided
 Cerebellum:            Previously seen        Abdomen:                Appears normal
 Posterior Fossa:       Previously seen        Abdominal Wall:         Previously seen
 Nuchal Fold:           Not applicable (>20    Cord Vessels:           Previously seen
                        wks GA)
 Face:                  Orbits and profile     Kidneys:                Appear normal
                        previously seen
 Lips:                  Previously seen        Bladder:                Appears normal
 Thoracic:              Appears normal         Spine:                  Previously seen
 Heart:                 Echogenic focus        Upper Extremities:      Previously seen
                        in LV
 RVOT:                  Appears normal         Lower Extremities:      Previously seen
 LVOT:                  Appears normal

 Other:  Parents do not wish to know sex of fetus. Heels visualized. Nasal
         bone visualized. Hands not well visualized.
Cervix Uterus Adnexa

 Cervix
 Not visualized (advanced GA >99wks)
Impression

 Follow up anatomy to for heart views
 Normal interval growth, suboptimal views of were obtained
 secondary to fetal position.
 Good fetal movement and amniotic fluid obtained
 Genetic screening- normal AFP tetra

 An isolated EIF was seen I reviewed that this finding does not
 impact the structure or function of the fetal heart. We
 discussed that this finding is a soft marker for down
 syndrome in the context of a normal exam and AFP tetra no
 further testing is warranted.
Recommendations

 Follow up growth in 4 weeks given BMI >40

## 2021-03-01 ENCOUNTER — Ambulatory Visit: Payer: Medicaid Other

## 2021-03-03 ENCOUNTER — Ambulatory Visit (INDEPENDENT_AMBULATORY_CARE_PROVIDER_SITE_OTHER): Payer: Medicaid Other

## 2021-03-03 ENCOUNTER — Other Ambulatory Visit: Payer: Self-pay

## 2021-03-03 DIAGNOSIS — Z23 Encounter for immunization: Secondary | ICD-10-CM | POA: Diagnosis not present

## 2021-03-21 DIAGNOSIS — Z20822 Contact with and (suspected) exposure to covid-19: Secondary | ICD-10-CM | POA: Diagnosis not present

## 2021-03-21 DIAGNOSIS — B349 Viral infection, unspecified: Secondary | ICD-10-CM | POA: Diagnosis not present

## 2021-03-21 DIAGNOSIS — R5383 Other fatigue: Secondary | ICD-10-CM | POA: Diagnosis not present

## 2021-03-21 DIAGNOSIS — R52 Pain, unspecified: Secondary | ICD-10-CM | POA: Diagnosis not present

## 2021-03-21 DIAGNOSIS — R0981 Nasal congestion: Secondary | ICD-10-CM | POA: Diagnosis not present

## 2021-03-21 DIAGNOSIS — R059 Cough, unspecified: Secondary | ICD-10-CM | POA: Diagnosis not present

## 2021-03-26 IMAGING — US US MFM OB FOLLOW-UP
1 series · 13 of 28 positions shown · non-contrast
Comparison: none

[Series 1: us mfm ob follow-up · 13 of 28 slices shown]
[im 2/28]
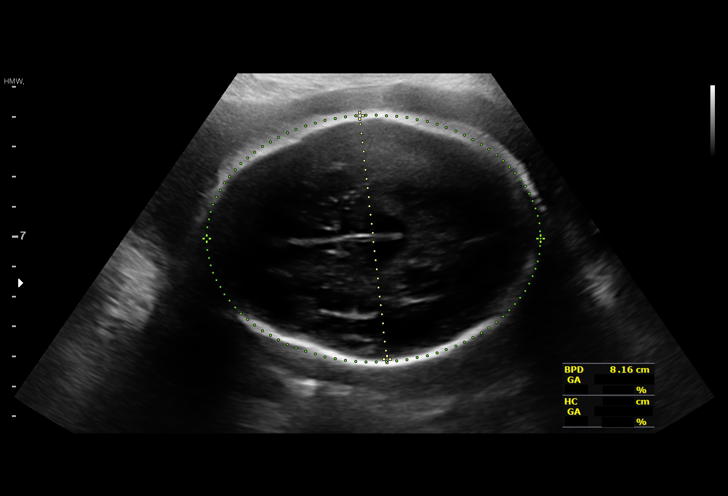
[im 4/28]
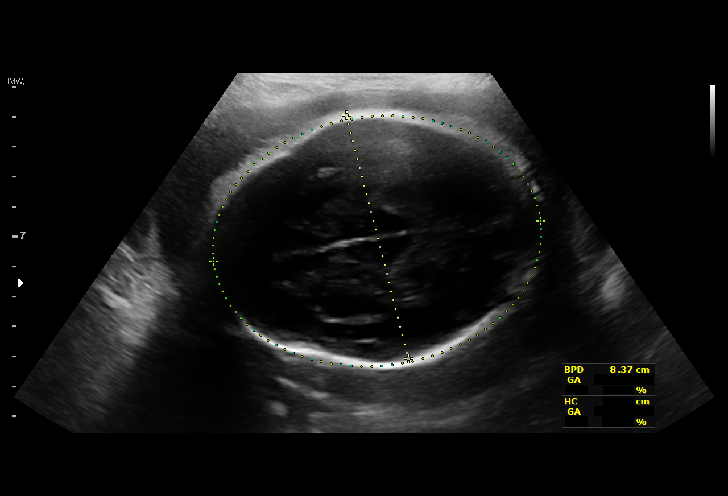
[im 6/28]
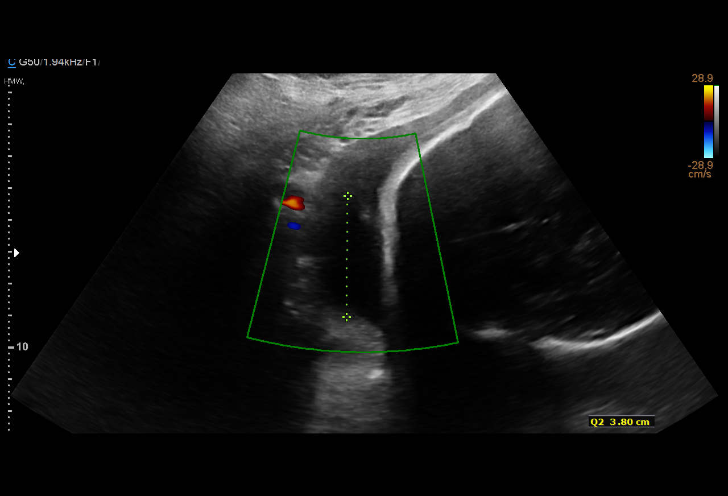
[im 8/28]
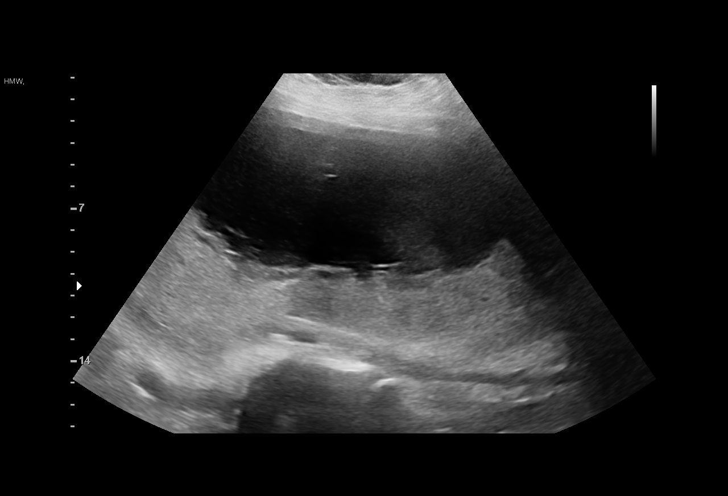
[im 10/28]
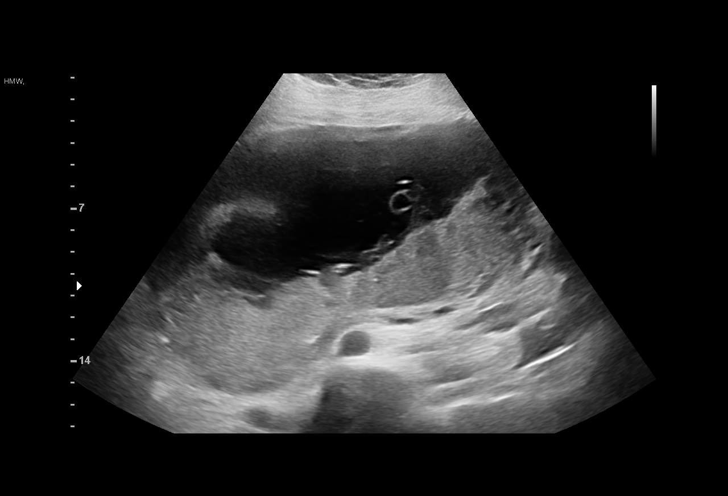
[im 12/28]
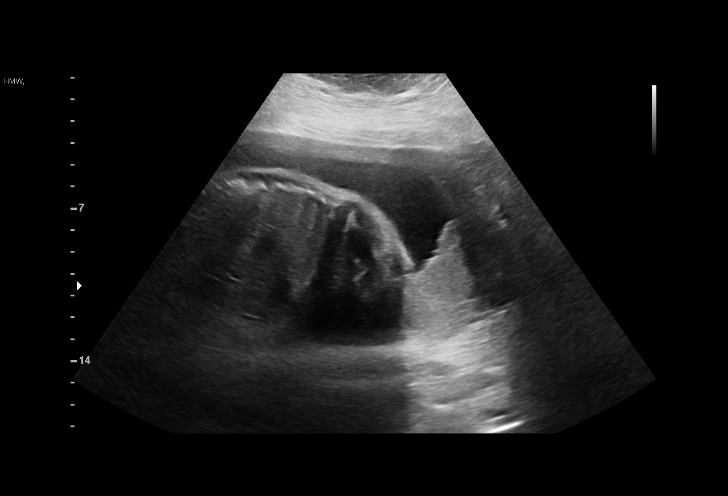
[im 15/28]
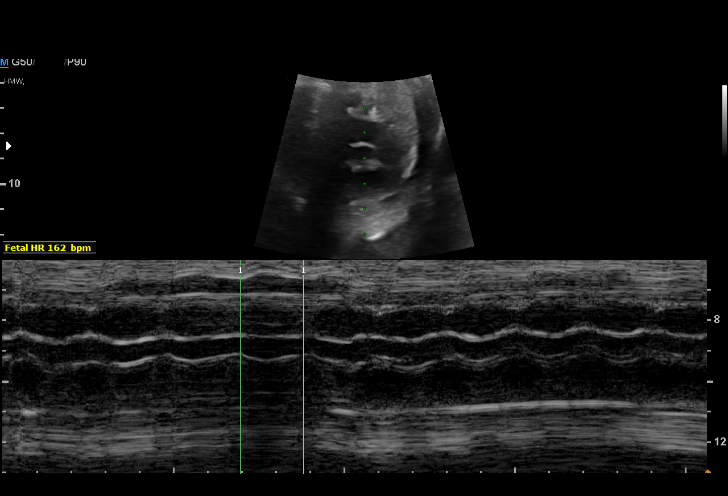
[im 17/28]
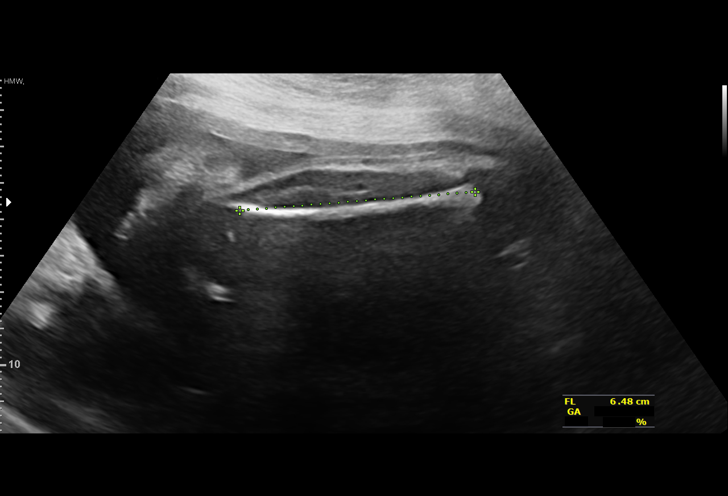
[im 19/28]
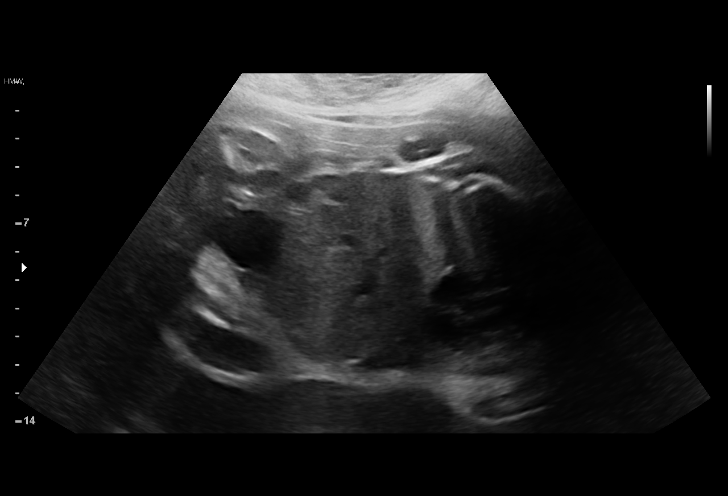
[im 21/28]
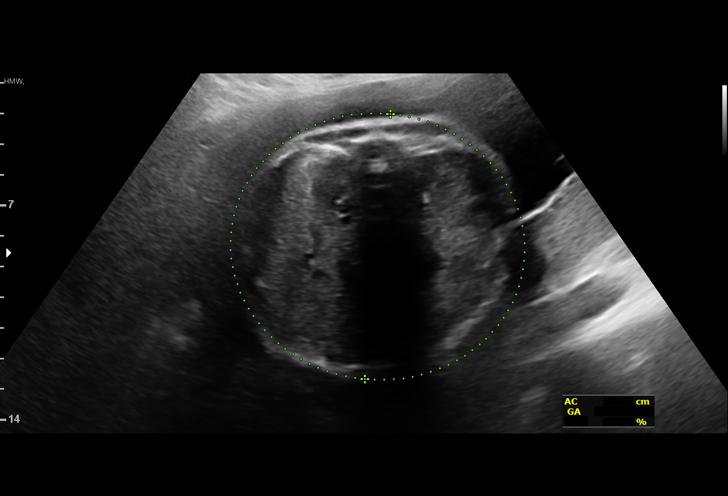
[im 23/28]
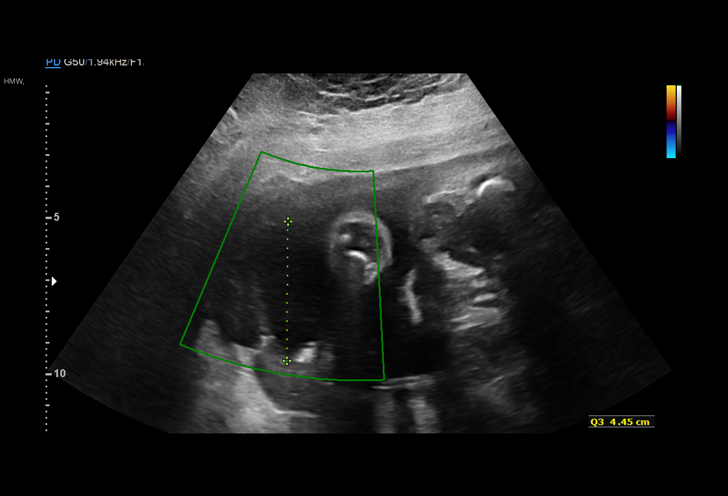
[im 25/28]
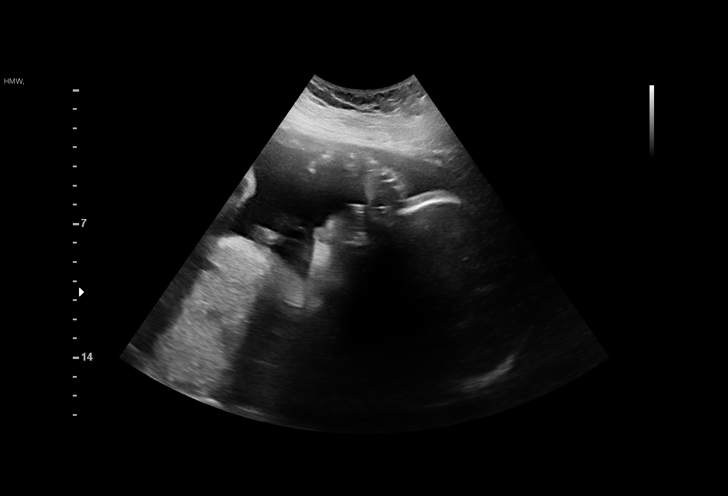
[im 27/28]
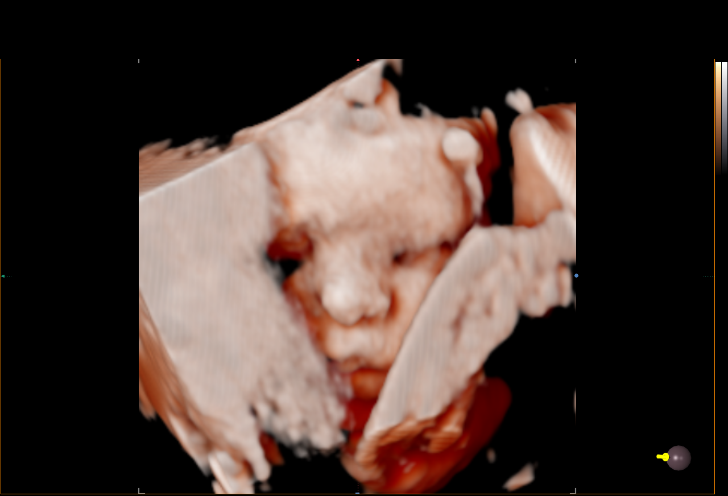

[13 of 28 positions shown; findings below may reference images not displayed]

street
                   RAFAEL ANDREEA ELIOPHOTOU

                                                       IPPA
 ----------------------------------------------------------------------

 ----------------------------------------------------------------------
Indications

  Obesity complicating pregnancy, second
  trimester (BMI 42.51)
  32 weeks gestation of pregnancy
  Encounter for antenatal screening for
  malformations (no NIPS)
  Previous cesarean delivery, antepartum
  Echogenic intracardiac focus of the heart
  (EIF)
 ----------------------------------------------------------------------
Vital Signs

                                                Height:        5'3"
Fetal Evaluation

 Num Of Fetuses:         1
 Fetal Heart Rate(bpm):  162
 Cardiac Activity:       Observed
 Presentation:           Cephalic
 Placenta:               Posterior
 P. Cord Insertion:      Previously Visualized

 Amniotic Fluid
 AFI FV:      Within normal limits

 AFI Sum(cm)     %Tile       Largest Pocket(cm)
 17.71           65

 RUQ(cm)       RLQ(cm)       LUQ(cm)        LLQ(cm)

Biometry

 BPD:      82.5  mm     G. Age:  33w 1d         53  %    CI:        72.98   %    70 - 86
                                                         FL/HC:      21.4   %    19.9 -
 HC:       307   mm     G. Age:  34w 2d         48  %    HC/AC:      1.09        0.96 -
 AC:      282.9  mm     G. Age:  32w 2d         34  %    FL/BPD:     79.8   %    71 - 87
 FL:       65.8  mm     G. Age:  33w 6d         67  %    FL/AC:      23.3   %    20 - 24

 Est. FW:    1886  gm    4 lb 10 oz      47  %
OB History

 Gravidity:    3         Term:   2        Prem:   0        SAB:   0
 TOP:          0       Ectopic:  0        Living: 2
Gestational Age

 U/S Today:     33w 3d                                        EDD:   01/16/20
 Best:          32w 6d     Det. By:  Early Ultrasound         EDD:   01/20/20
                                     (06/17/19)
Anatomy

 Cranium:               Appears normal         Aortic Arch:            Previously seen
 Cavum:                 Previously seen        Ductal Arch:            Not well visualized
 Ventricles:            Previously seen        Diaphragm:              Previously seen
 Choroid Plexus:        Previously seen        Stomach:                Appears normal, left
                                                                       sided
 Cerebellum:            Previously seen        Abdomen:                Previously seen
 Posterior Fossa:       Previously seen        Abdominal Wall:         Previously seen
 Nuchal Fold:           Not applicable (>20    Cord Vessels:           Previously seen
                        wks GA)
 Face:                  Orbits and profile     Kidneys:                Appear normal
                        previously seen
 Lips:                  Previously seen        Bladder:                Appears normal
 Thoracic:              Appears normal         Spine:                  Previously seen
 Heart:                 Echogenic focus        Upper Extremities:      Previously seen
                        in LV
 RVOT:                  Previously seen        Lower Extremities:      Previously seen
 LVOT:                  Previously seen

 Other:  Parents do not wish to know sex of fetus. Heels visualized prev.
         Nasal bone visualized prev. Hands not well visualized.
Cervix Uterus Adnexa

 Cervix
 Not visualized (advanced GA >32wks)
Comments

 This patient was seen for a follow up growth scan due to
 maternal obesity.  She denies any problems in her current
 pregnancy and reports that she has screened negative for
 gestational diabetes.
 She was informed that the fetal growth and amniotic fluid
 level appears appropriate for her gestational age.
 A follow up exam was scheduled in 4 weeks.

## 2021-04-01 DIAGNOSIS — Z3202 Encounter for pregnancy test, result negative: Secondary | ICD-10-CM | POA: Diagnosis not present

## 2021-04-01 DIAGNOSIS — N898 Other specified noninflammatory disorders of vagina: Secondary | ICD-10-CM | POA: Diagnosis not present

## 2021-04-01 DIAGNOSIS — R300 Dysuria: Secondary | ICD-10-CM | POA: Diagnosis not present

## 2021-04-23 IMAGING — US US MFM OB FOLLOW-UP
1 series · 14 of 28 positions shown · non-contrast
Comparison: none

[Series 1: us mfm ob follow-up · 39 acquisitions, 14 frames shown]
[im 2/39]
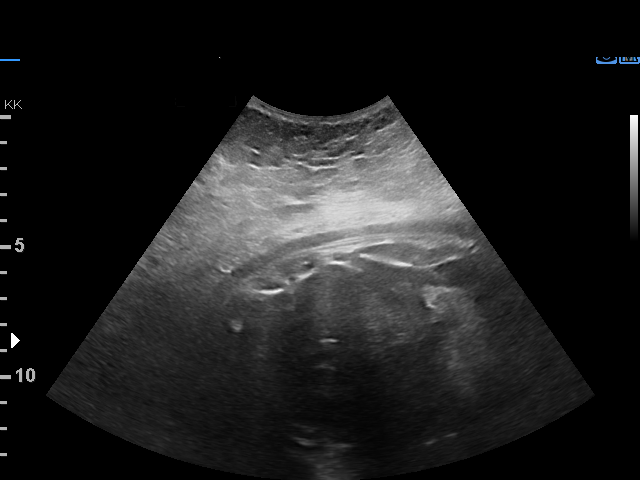
[im 5/39]
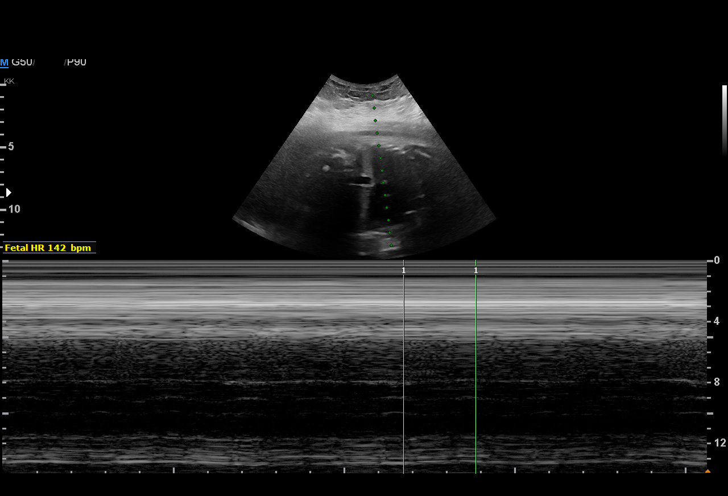
[im 8/39]
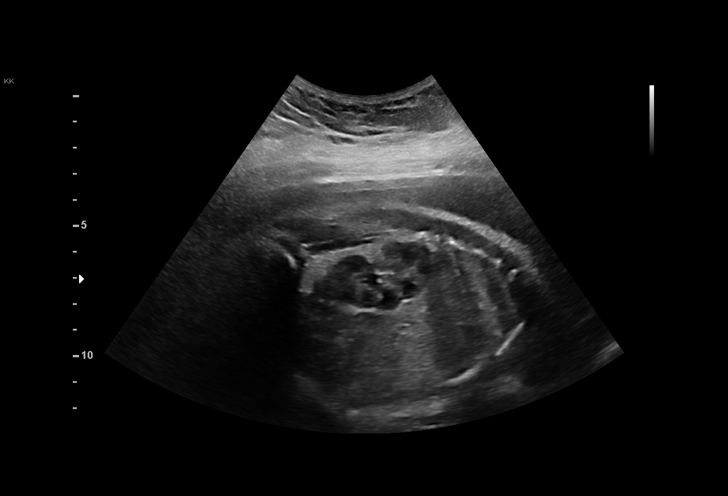
[im 10/39]
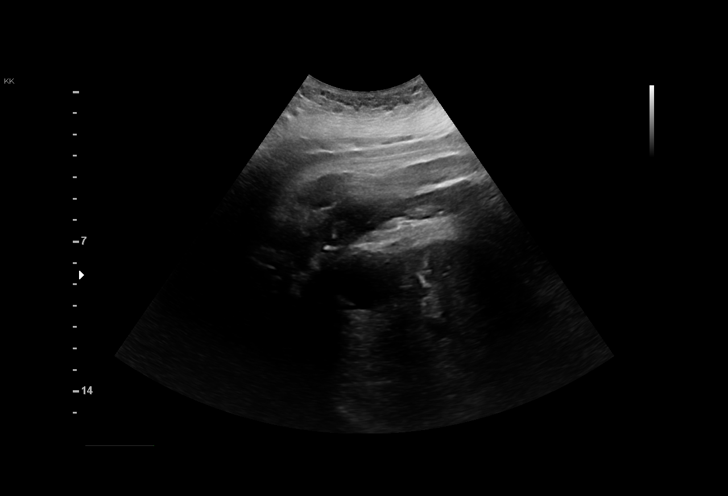
[im 13/39]
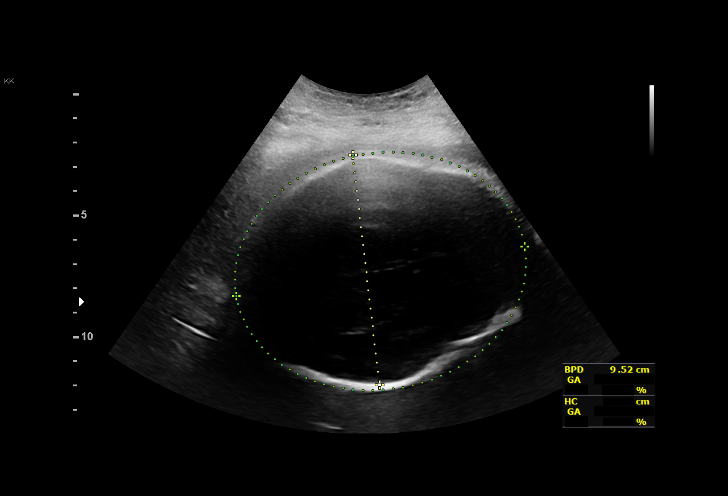
[im 16/39]
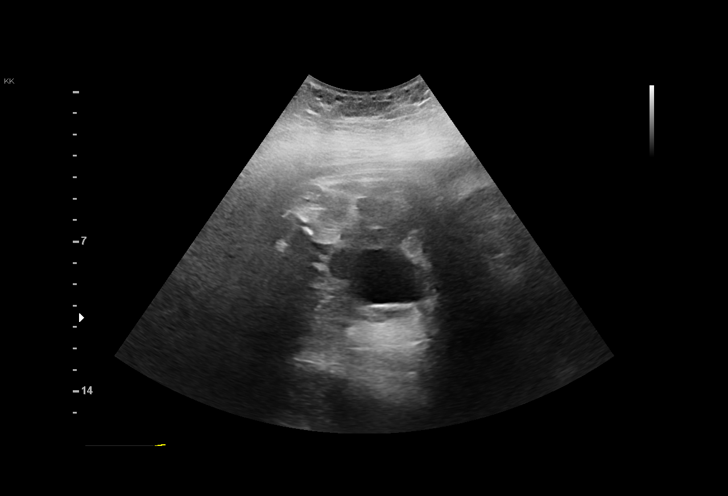
[im 19/39]
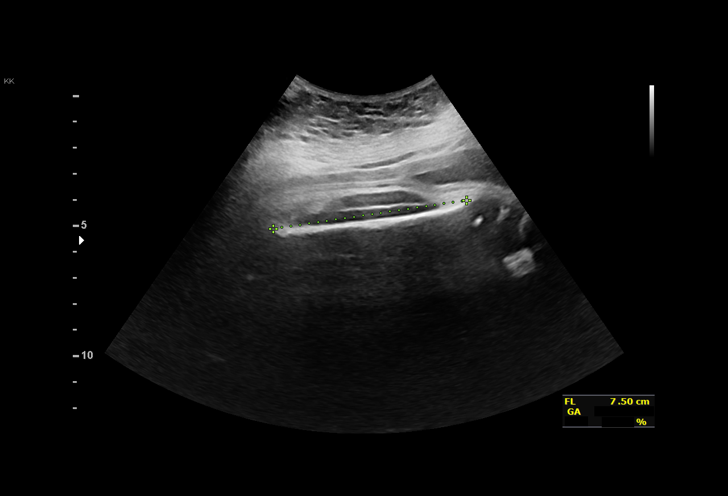
[im 22/39]
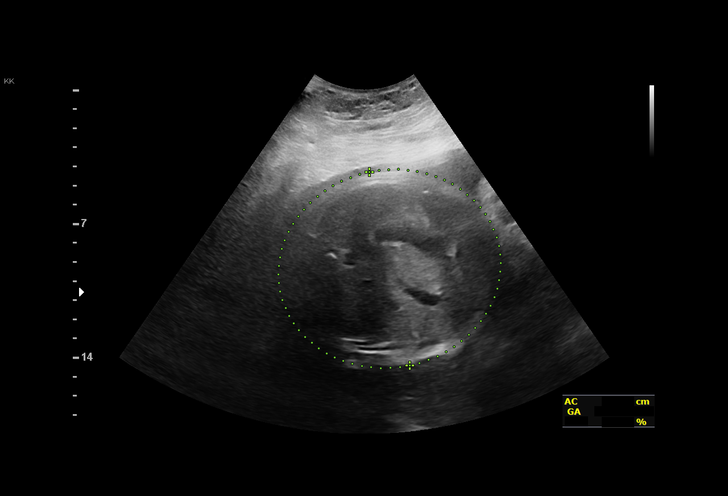
[im 24/39]
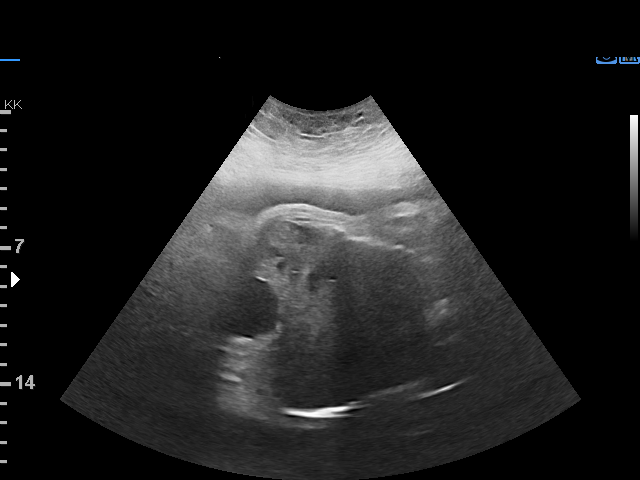
[im 27/39]
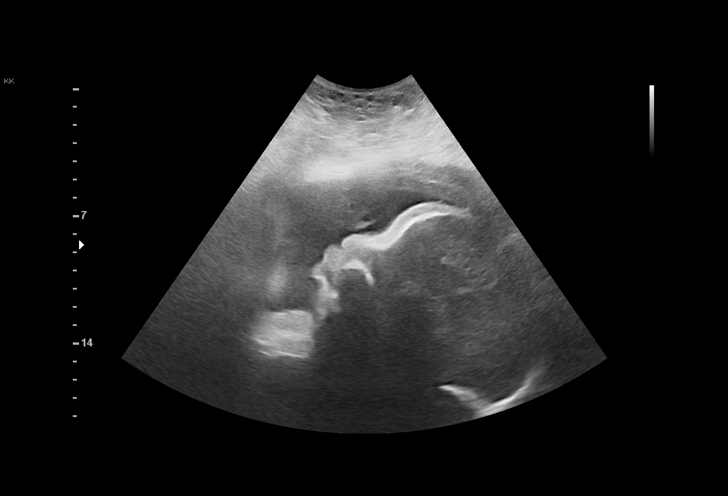
[im 30/39]
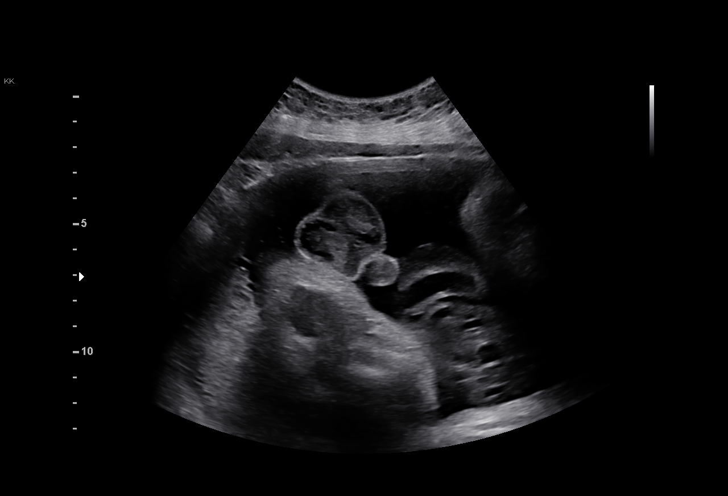
[im 33/39]
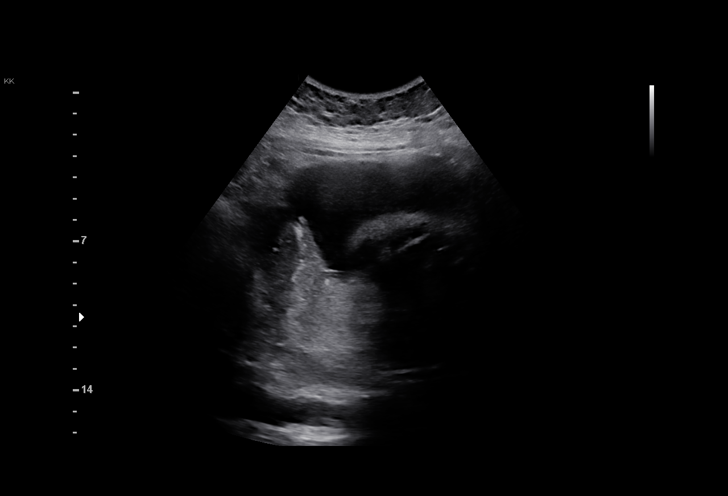
[im 36/39]
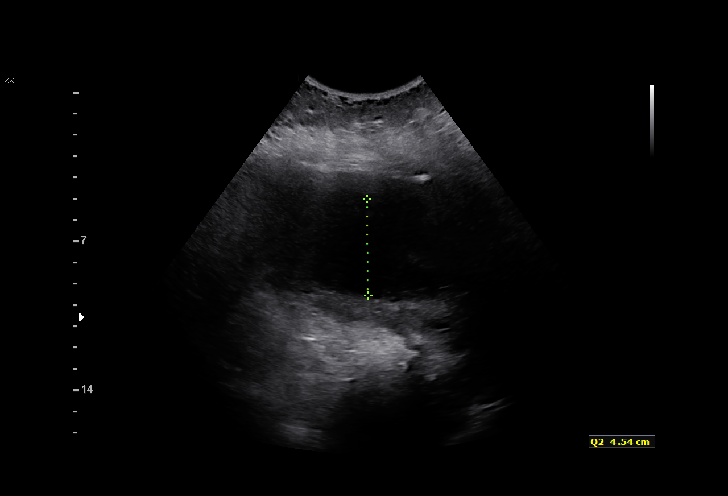
[im 39/39]
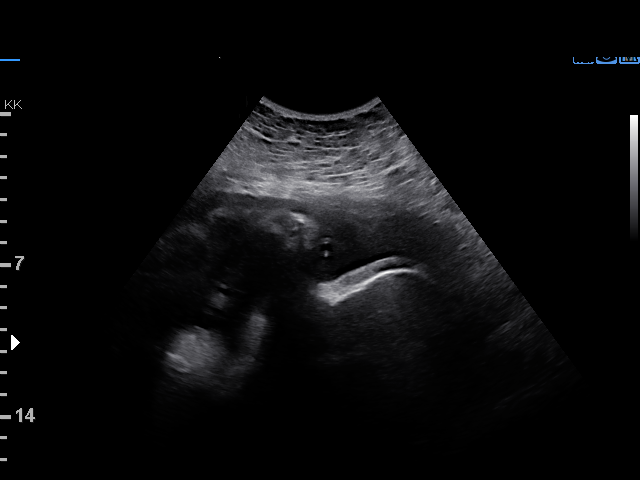

[14 of 28 positions shown; findings below may reference images not displayed]

[place]
                   LAOUINI-MOUHID BAHADUR BUDHATHOKI

Indications

 Previous cesarean delivery, antepartum
 Echogenic intracardiac focus of the heart
 (EIF)
 Encounter for other antenatal screening
 follow-up
 Obesity complicating pregnancy, third
 trimester
 36 weeks gestation of pregnancy
Vital Signs

 BMI:
Fetal Evaluation

 Num Of Fetuses:          1
 Fetal Heart Rate(bpm):   142
 Cardiac Activity:        Observed
 Presentation:            Cephalic
 Placenta:                Posterior
 P. Cord Insertion:       Previously Visualized

 Amniotic Fluid
 AFI FV:      Within normal limits

 AFI Sum(cm)     %Tile       Largest Pocket(cm)
 15.5            58          6.

 RUQ(cm)       RLQ(cm)       LUQ(cm)        LLQ(cm)
 6
Biometry

 BPD:      94.5  mm     G. Age:  38w 4d         94  %    CI:          76.9  %    70 - 86
                                                         FL/HC:       21.9  %    20.8 -
 HC:      341.3  mm     G. Age:  39w 2d         81  %    HC/AC:       1.00       0.92 -
 AC:      339.9  mm     G. Age:  37w 6d         87  %    FL/BPD:      79.2  %    71 - 87
 FL:       74.8  mm     G. Age:  38w 2d         82  %    FL/AC:       22.0  %    20 - 24

 Est. FW:    8683   gm     7 lb 9 oz     87  %
OB History

 Gravidity:    3         Term:   2        Prem:   0        SAB:   0
 TOP:          0       Ectopic:  0        Living: 2
Gestational Age

 U/S Today:     38w 4d                                        EDD:   01/08/20
 Best:          36w 6d     Det. By:  Early Ultrasound         EDD:   01/20/20
                                     (06/17/19)
Anatomy

 Cranium:               Appears normal         Aortic Arch:            Previously seen
 Cavum:                 Previously seen        Ductal Arch:            Not well visualized
 Ventricles:            Previously seen        Diaphragm:              Previously seen
 Choroid Plexus:        Previously seen        Stomach:                Appears normal, left
                                                                       sided
 Cerebellum:            Previously seen        Abdomen:                Previously seen
 Posterior Fossa:       Previously seen        Abdominal Wall:         Previously seen
 Nuchal Fold:           Not applicable (>20    Cord Vessels:           Previously seen
                        wks GA)
 Face:                  Orbits and profile     Kidneys:                Appear normal
                        previously seen
 Lips:                  Previously seen        Bladder:                Appears normal
 Thoracic:              Appears normal         Spine:                  Previously seen
 Heart:                 Echogenic focus        Upper Extremities:      Previously seen
                        in LV prev
 RVOT:                  Previously seen        Lower Extremities:      Previously seen
 LVOT:                  Previously seen

 Other:  Heels, Nasal bone previously visualized. Technically difficult due to
         maternal habitus.
Impression

 Follow up growth due to maternal habitus
 Normal interval growth with good fluid and fetal movement
Recommendations

 Follow up as clinically indicated.

## 2021-06-07 DIAGNOSIS — F411 Generalized anxiety disorder: Secondary | ICD-10-CM | POA: Diagnosis not present

## 2021-06-14 DIAGNOSIS — F411 Generalized anxiety disorder: Secondary | ICD-10-CM | POA: Diagnosis not present

## 2021-06-22 DIAGNOSIS — F411 Generalized anxiety disorder: Secondary | ICD-10-CM | POA: Diagnosis not present

## 2021-06-27 DIAGNOSIS — F411 Generalized anxiety disorder: Secondary | ICD-10-CM | POA: Diagnosis not present

## 2021-07-04 DIAGNOSIS — F411 Generalized anxiety disorder: Secondary | ICD-10-CM | POA: Diagnosis not present

## 2021-07-11 DIAGNOSIS — F411 Generalized anxiety disorder: Secondary | ICD-10-CM | POA: Diagnosis not present

## 2021-07-20 DIAGNOSIS — F411 Generalized anxiety disorder: Secondary | ICD-10-CM | POA: Diagnosis not present

## 2021-07-27 DIAGNOSIS — F411 Generalized anxiety disorder: Secondary | ICD-10-CM | POA: Diagnosis not present

## 2021-08-04 DIAGNOSIS — F411 Generalized anxiety disorder: Secondary | ICD-10-CM | POA: Diagnosis not present

## 2021-08-10 DIAGNOSIS — F411 Generalized anxiety disorder: Secondary | ICD-10-CM | POA: Diagnosis not present

## 2021-08-17 ENCOUNTER — Other Ambulatory Visit: Payer: Self-pay

## 2021-08-17 ENCOUNTER — Ambulatory Visit (INDEPENDENT_AMBULATORY_CARE_PROVIDER_SITE_OTHER): Payer: Medicaid Other

## 2021-08-17 DIAGNOSIS — Z23 Encounter for immunization: Secondary | ICD-10-CM | POA: Diagnosis present

## 2021-08-24 DIAGNOSIS — F411 Generalized anxiety disorder: Secondary | ICD-10-CM | POA: Diagnosis not present

## 2021-09-01 DIAGNOSIS — F411 Generalized anxiety disorder: Secondary | ICD-10-CM | POA: Diagnosis not present

## 2021-09-12 DIAGNOSIS — F411 Generalized anxiety disorder: Secondary | ICD-10-CM | POA: Diagnosis not present

## 2021-09-13 ENCOUNTER — Ambulatory Visit (INDEPENDENT_AMBULATORY_CARE_PROVIDER_SITE_OTHER): Payer: Medicaid Other

## 2021-09-13 ENCOUNTER — Other Ambulatory Visit: Payer: Self-pay

## 2021-09-13 DIAGNOSIS — Z111 Encounter for screening for respiratory tuberculosis: Secondary | ICD-10-CM

## 2021-09-14 NOTE — Progress Notes (Signed)
Patient is here for a PPD placement.  PPD placed in left forearm @ 1043 am.  Patient will return 09/15/2021 to have PPD read.   Veronda Prude, RN

## 2021-09-15 ENCOUNTER — Telehealth: Payer: Self-pay | Admitting: *Deleted

## 2021-09-15 ENCOUNTER — Other Ambulatory Visit: Payer: Self-pay

## 2021-09-15 ENCOUNTER — Ambulatory Visit: Payer: Medicaid Other

## 2021-09-15 NOTE — Telephone Encounter (Signed)
LVM message to call office, need to see if she can come back to have her TB read. Destane Speas Zimmerman Rumple, CMA

## 2021-09-15 NOTE — Telephone Encounter (Signed)
Contacted emergency contact and he is going to send a FM message to her.Sergey Ishler Zimmerman Rumple, CMA

## 2021-09-23 DIAGNOSIS — F411 Generalized anxiety disorder: Secondary | ICD-10-CM | POA: Diagnosis not present

## 2021-09-26 ENCOUNTER — Ambulatory Visit: Payer: Medicaid Other

## 2021-10-06 DIAGNOSIS — F411 Generalized anxiety disorder: Secondary | ICD-10-CM | POA: Diagnosis not present

## 2021-10-07 DIAGNOSIS — F411 Generalized anxiety disorder: Secondary | ICD-10-CM | POA: Diagnosis not present

## 2021-10-10 DIAGNOSIS — F411 Generalized anxiety disorder: Secondary | ICD-10-CM | POA: Diagnosis not present

## 2021-10-20 ENCOUNTER — Encounter: Payer: Self-pay | Admitting: Family Medicine

## 2021-10-20 ENCOUNTER — Ambulatory Visit (INDEPENDENT_AMBULATORY_CARE_PROVIDER_SITE_OTHER): Payer: Medicaid Other | Admitting: Family Medicine

## 2021-10-20 ENCOUNTER — Other Ambulatory Visit: Payer: Self-pay

## 2021-10-20 ENCOUNTER — Other Ambulatory Visit (HOSPITAL_COMMUNITY)
Admission: RE | Admit: 2021-10-20 | Discharge: 2021-10-20 | Disposition: A | Discharge status: Home / Self Care | Payer: Medicaid Other | Source: Ambulatory Visit | Attending: Family Medicine | Admitting: Family Medicine

## 2021-10-20 VITALS — BP 107/79 | HR 88 | Wt 241.6 lb

## 2021-10-20 DIAGNOSIS — Z021 Encounter for pre-employment examination: Secondary | ICD-10-CM | POA: Diagnosis not present

## 2021-10-20 DIAGNOSIS — D619 Aplastic anemia, unspecified: Secondary | ICD-10-CM | POA: Diagnosis not present

## 2021-10-20 DIAGNOSIS — Z3200 Encounter for pregnancy test, result unknown: Secondary | ICD-10-CM | POA: Diagnosis not present

## 2021-10-20 DIAGNOSIS — Z114 Encounter for screening for human immunodeficiency virus [HIV]: Secondary | ICD-10-CM

## 2021-10-20 DIAGNOSIS — D508 Other iron deficiency anemias: Secondary | ICD-10-CM | POA: Diagnosis not present

## 2021-10-20 DIAGNOSIS — Z1159 Encounter for screening for other viral diseases: Secondary | ICD-10-CM

## 2021-10-20 DIAGNOSIS — Z113 Encounter for screening for infections with a predominantly sexual mode of transmission: Secondary | ICD-10-CM | POA: Diagnosis not present

## 2021-10-20 LAB — POCT WET PREP (WET MOUNT)
Clue Cells Wet Prep Whiff POC: NEGATIVE
Trichomonas Wet Prep HPF POC: ABSENT

## 2021-10-20 LAB — POCT URINE PREGNANCY: Preg Test, Ur: NEGATIVE

## 2021-10-20 MED ORDER — PRENATAL VITAMINS 28-0.8 MG PO TABS: 1.0000 | ORAL_TABLET | Freq: Every day | ORAL | 11 refills | Status: DC

## 2021-10-20 NOTE — Progress Notes (Signed)
? ? ?  SUBJECTIVE:  ? ?Chief compliant/HPI: annual examination ? ?Chelsea Baldwin is a 31 y.o. who presents today for an annual exam.  ? ?Review of systems form negative today.  ? ?Updated history tabs and problem list.  ? ?Patient understands that she is not due for a Pap smear today but is interested in STD screening including blood testing.  She reports that she is sexually active with a partner and does not regularly use protection.  She is not on any birth control and is not interested in birth control at this time.  Had long discussion regarding this.  Patient's LMP was September 24, 2021.  Denies any abnormal discharge. ? ?Patient would also like her hemoglobin checked because she has history of iron deficiency anemia and is not currently taking any supplementations. ? ?Patient is also in need of a preemployment drug screen. ?OBJECTIVE:  ? ?BP 107/79   Pulse 88   Wt 241 lb 9.6 oz (109.6 kg)   LMP  (LMP Unknown)   SpO2 100%   BMI 42.80 kg/m?   ?Physical Exam ?Vitals and nursing note reviewed. Exam conducted with a chaperone present.  ?Constitutional:   ?   Appearance: Normal appearance. She is normal weight.  ?HENT:  ?   Head: Normocephalic and atraumatic.  ?   Right Ear: External ear normal.  ?   Left Ear: External ear normal.  ?   Nose: Nose normal.  ?   Mouth/Throat:  ?   Mouth: Mucous membranes are moist.  ?Eyes:  ?   Extraocular Movements: Extraocular movements intact.  ?   Pupils: Pupils are equal, round, and reactive to light.  ?Cardiovascular:  ?   Rate and Rhythm: Normal rate.  ?   Pulses: Normal pulses.  ?Pulmonary:  ?   Effort: Pulmonary effort is normal.  ?Abdominal:  ?   General: Bowel sounds are normal.  ?Genitourinary: ?   General: Normal vulva.  ?   Exam position: Lithotomy position.  ?   Pubic Area: No rash.   ?   Vagina: Normal.  ?   Cervix: Normal.  ?Musculoskeletal:     ?   General: Normal range of motion.  ?Skin: ?   General: Skin is warm and dry.  ?   Capillary Refill: Capillary  refill takes less than 2 seconds.  ?Neurological:  ?   General: No focal deficit present.  ?   Mental Status: She is alert. Mental status is at baseline.  ?  ? ?ASSESSMENT/PLAN:  ? ?No problem-specific Assessment & Plan notes found for this encounter. ?  ? ?Annual Examination  ?See AVS for age appropriate recommendations.  ? ?PHQ score 0, reviewed and discussed. ?Blood pressure reviewed and at goal.  ?This patient does not currently use contraception.  Had a long discussion regarding contraception but patient is okay with getting pregnant if it were to occur.  Prenatal vitamins sent to patient's pharmacy ? ? ? ?Considered the following items based upon USPSTF recommendations: ?HIV testing: ordered ?Syphilis if at high risk: ordered ?GC/CT at high risk, ordered.  ?Lipid panel (nonfasting or fasting) discussed based upon AHA recommendations and not ordered.  Consider repeat every 4-6 years.  ?Reviewed risk factors for latent tuberculosis and not indicated ? ?Cervical cancer screening: prior Pap reviewed, repeat due in 10/11/2023 ?Immunizations up-to-date ? ?Follow up in 1  year or sooner if indicated.  ? ? ?Gifford Shave, MD ?[place]   ?

## 2021-10-20 NOTE — Patient Instructions (Addendum)
It was great seeing you today.  We collected routine screening for STDs as well as a urine.  We also collecting blood work.  All these results will come to your MyChart.  If there are any abnormalities I will call you.  If you are interested in any form of birth control please let [place] know.  I sent a prescription for prenatal vitamins to your pharmacy just in case.  If you have any questions or concerns please feel free to call the clinic.  I hope you have a great afternoon! ? ? ?

## 2021-10-23 ENCOUNTER — Other Ambulatory Visit: Payer: Self-pay | Admitting: Family Medicine

## 2021-10-23 LAB — CERVICOVAGINAL ANCILLARY ONLY
Chlamydia: POSITIVE — AB
Comment: NEGATIVE
Comment: NEGATIVE
Comment: NORMAL
Neisseria Gonorrhea: NEGATIVE
Trichomonas: NEGATIVE

## 2021-10-23 NOTE — Telephone Encounter (Signed)
Attempted to call patient again but she did not answer.  Will send MyChart message to make sure we have the correct phone number. ?

## 2021-10-23 NOTE — Telephone Encounter (Signed)
Attempted to call patient regarding positive chlamydia test.  Will retry later.  Order pended for doxycycline 100 mg twice daily. ?

## 2021-10-24 NOTE — Telephone Encounter (Signed)
Once again attempted to call the patient to notify her of her results and send the medication in.  Was unsuccessful at contacting the patient. ?

## 2021-10-25 MED ORDER — DOXYCYCLINE HYCLATE 100 MG PO TABS: 100.0000 mg | ORAL_TABLET | Freq: Two times a day (BID) | ORAL | 0 refills | Status: DC

## 2021-10-25 NOTE — Telephone Encounter (Signed)
Spoke with patient today regarding her positive chlamydia test.  Discussed that this is an STD and so she should use protection or abstain from sexual activity until she has completed her treatment.  Also discussed that her partner will need to be informed and treated.  Offered expedited partner therapy but patient reports she will talk to her partner and may call the clinic to get this treatment.  Is not interested at this time. ?

## 2021-10-28 DIAGNOSIS — F411 Generalized anxiety disorder: Secondary | ICD-10-CM | POA: Diagnosis not present

## 2021-10-28 LAB — DRUG SCREEN 13 W/CONF, WB
Amphetamines, IA: NEGATIVE ng/mL
Barbiturates, IA: NEGATIVE ug/mL
Benzodiazepines, IA: NEGATIVE ng/mL
Cocaine/Metabolite, IA: NEGATIVE ng/mL
Fentanyl, IA: NEGATIVE ng/mL
Meperidine, IA: NEGATIVE ng/mL
Methadone, IA: NEGATIVE ng/mL
Opiates, IA: NEGATIVE ng/mL
Oxycodones, IA: NEGATIVE ng/mL
Phencyclidine, IA: NEGATIVE ng/mL
Propoxyphene, IA: NEGATIVE ng/mL
THC (Marijuana) Mtb, IA: NEGATIVE ng/mL
Tramadol, IA: NEGATIVE ng/mL

## 2021-10-28 LAB — HIV ANTIBODY (ROUTINE TESTING W REFLEX): HIV Screen 4th Generation wRfx: NONREACTIVE

## 2021-10-28 LAB — FERRITIN: Ferritin: 33 ng/mL (ref 15–150)

## 2021-10-28 LAB — CBC
Hematocrit: 36.3 % (ref 34.0–46.6)
Hemoglobin: 12.3 g/dL (ref 11.1–15.9)
MCH: 27.5 pg (ref 26.6–33.0)
MCHC: 33.9 g/dL (ref 31.5–35.7)
MCV: 81 fL (ref 79–97)
Platelets: 327 10*3/uL (ref 150–450)
RBC: 4.47 x10E6/uL (ref 3.77–5.28)
RDW: 14.5 % (ref 11.7–15.4)
WBC: 7.2 10*3/uL (ref 3.4–10.8)

## 2021-10-28 LAB — RPR: RPR Ser Ql: NONREACTIVE

## 2021-11-06 DIAGNOSIS — F411 Generalized anxiety disorder: Secondary | ICD-10-CM | POA: Diagnosis not present

## 2021-11-16 DIAGNOSIS — F411 Generalized anxiety disorder: Secondary | ICD-10-CM | POA: Diagnosis not present

## 2021-11-24 DIAGNOSIS — F411 Generalized anxiety disorder: Secondary | ICD-10-CM | POA: Diagnosis not present

## 2021-12-01 DIAGNOSIS — F411 Generalized anxiety disorder: Secondary | ICD-10-CM | POA: Diagnosis not present

## 2021-12-18 DIAGNOSIS — F411 Generalized anxiety disorder: Secondary | ICD-10-CM | POA: Diagnosis not present

## 2022-01-09 ENCOUNTER — Ambulatory Visit: Payer: Medicaid Other | Admitting: Family Medicine

## 2022-01-12 ENCOUNTER — Ambulatory Visit: Payer: Medicaid Other | Admitting: Family Medicine

## 2022-01-12 ENCOUNTER — Encounter: Payer: Self-pay | Admitting: Family Medicine

## 2022-01-12 DIAGNOSIS — Z309 Encounter for contraceptive management, unspecified: Secondary | ICD-10-CM

## 2022-01-12 LAB — POCT URINE PREGNANCY: Preg Test, Ur: NEGATIVE

## 2022-01-12 MED ORDER — MEDROXYPROGESTERONE ACETATE 150 MG/ML IM SUSP
150.0000 mg | Freq: Once | INTRAMUSCULAR | Status: AC
  Administered 2022-01-12: 150 mg via INTRAMUSCULAR

## 2022-01-12 NOTE — Patient Instructions (Signed)
It was wonderful seeing you today!  Your pregnancy test today was negative but we do recommend that you repeat it in 1 to 2 weeks.  You can do at home pregnancy test.  We are giving you Depo-Provera today and you will need another dose in approximately 3 months.  If you have any issues or concerns please let us know.  Hope you have a wonderful day!

## 2022-01-12 NOTE — Progress Notes (Unsigned)
    SUBJECTIVE:   CHIEF COMPLAINT / HPI:   Contraceptive counseling Patient is interested in birth control at this time.  We have discussed this in the past and she is decided that she wants Depo-Provera.  Was last sexually active approximately 1 week ago.  Did not use protection.  Has been having normal menstrual cycles.  Patient has no further questions regarding Depo. OBJECTIVE:   BP 110/79   Pulse 91   Ht 5\' 3"  (1.6 m)   Wt 241 lb 3.2 oz (109.4 kg)   LMP 12/18/2021   SpO2 100%   BMI 42.73 kg/m   General: Pleasant 31 year old female in no acute distress cardiac: Regular rate Respiratory: No work of breathing, speaking full sentences MSK: No gross abnormalities   ASSESSMENT/PLAN:   Encounter for contraceptive management Patient presents for contraceptive management.  Discussed all of her options including but not limited to OCPs, Nexplanon, IUD, Depo.  Patient has elected for Depo-Provera.  Pregnancy test negative today.  Patient was recently sexually active so recommended she take repeat pregnancy test in 1 to 2 weeks.  Depo-Provera given today.  Follow-up in 3 months for next Depo injection.     26, MD [facility]

## 2022-01-13 DIAGNOSIS — Z309 Encounter for contraceptive management, unspecified: Secondary | ICD-10-CM | POA: Insufficient documentation

## 2022-01-13 NOTE — Assessment & Plan Note (Signed)
Patient presents for contraceptive management.  Discussed all of her options including but not limited to OCPs, Nexplanon, IUD, Depo.  Patient has elected for Depo-Provera.  Pregnancy test negative today.  Patient was recently sexually active so recommended she take repeat pregnancy test in 1 to 2 weeks.  Depo-Provera given today.  Follow-up in 3 months for next Depo injection.

## 2022-01-23 ENCOUNTER — Encounter: Payer: Self-pay | Admitting: *Deleted

## 2022-02-19 DIAGNOSIS — F411 Generalized anxiety disorder: Secondary | ICD-10-CM | POA: Diagnosis not present

## 2022-03-16 DIAGNOSIS — F411 Generalized anxiety disorder: Secondary | ICD-10-CM | POA: Diagnosis not present

## 2022-03-21 DIAGNOSIS — Z1329 Encounter for screening for other suspected endocrine disorder: Secondary | ICD-10-CM | POA: Diagnosis not present

## 2022-03-21 DIAGNOSIS — E559 Vitamin D deficiency, unspecified: Secondary | ICD-10-CM | POA: Diagnosis not present

## 2022-03-21 DIAGNOSIS — Z131 Encounter for screening for diabetes mellitus: Secondary | ICD-10-CM | POA: Diagnosis not present

## 2022-03-21 DIAGNOSIS — Z6841 Body Mass Index (BMI) 40.0 and over, adult: Secondary | ICD-10-CM | POA: Diagnosis not present

## 2022-03-21 DIAGNOSIS — R635 Abnormal weight gain: Secondary | ICD-10-CM | POA: Diagnosis not present

## 2022-03-21 DIAGNOSIS — Z13220 Encounter for screening for lipoid disorders: Secondary | ICD-10-CM | POA: Diagnosis not present

## 2022-03-21 DIAGNOSIS — N951 Menopausal and female climacteric states: Secondary | ICD-10-CM | POA: Diagnosis not present

## 2022-06-01 DIAGNOSIS — F411 Generalized anxiety disorder: Secondary | ICD-10-CM | POA: Diagnosis not present

## 2022-06-08 ENCOUNTER — Ambulatory Visit: Payer: Medicaid Other | Admitting: Student

## 2022-06-22 DIAGNOSIS — F411 Generalized anxiety disorder: Secondary | ICD-10-CM | POA: Diagnosis not present

## 2022-06-26 DIAGNOSIS — H578A1 Foreign body sensation, right eye: Secondary | ICD-10-CM | POA: Diagnosis not present

## 2022-07-14 DIAGNOSIS — F411 Generalized anxiety disorder: Secondary | ICD-10-CM | POA: Diagnosis not present

## 2022-07-27 DIAGNOSIS — F411 Generalized anxiety disorder: Secondary | ICD-10-CM | POA: Diagnosis not present

## 2022-08-08 DIAGNOSIS — F411 Generalized anxiety disorder: Secondary | ICD-10-CM | POA: Diagnosis not present

## 2022-09-24 DIAGNOSIS — F411 Generalized anxiety disorder: Secondary | ICD-10-CM | POA: Diagnosis not present

## 2022-09-25 DIAGNOSIS — F411 Generalized anxiety disorder: Secondary | ICD-10-CM | POA: Diagnosis not present

## 2022-10-18 DIAGNOSIS — F411 Generalized anxiety disorder: Secondary | ICD-10-CM | POA: Diagnosis not present

## 2022-10-24 DIAGNOSIS — R12 Heartburn: Secondary | ICD-10-CM | POA: Diagnosis not present

## 2022-10-24 DIAGNOSIS — E611 Iron deficiency: Secondary | ICD-10-CM | POA: Diagnosis not present

## 2022-10-24 DIAGNOSIS — N951 Menopausal and female climacteric states: Secondary | ICD-10-CM | POA: Diagnosis not present

## 2022-10-24 DIAGNOSIS — E559 Vitamin D deficiency, unspecified: Secondary | ICD-10-CM | POA: Diagnosis not present

## 2022-10-29 DIAGNOSIS — Z6841 Body Mass Index (BMI) 40.0 and over, adult: Secondary | ICD-10-CM | POA: Diagnosis not present

## 2022-10-29 DIAGNOSIS — R635 Abnormal weight gain: Secondary | ICD-10-CM | POA: Diagnosis not present

## 2022-10-29 DIAGNOSIS — Z1331 Encounter for screening for depression: Secondary | ICD-10-CM | POA: Diagnosis not present

## 2022-10-29 DIAGNOSIS — E611 Iron deficiency: Secondary | ICD-10-CM | POA: Diagnosis not present

## 2022-10-29 DIAGNOSIS — Z1339 Encounter for screening examination for other mental health and behavioral disorders: Secondary | ICD-10-CM | POA: Diagnosis not present

## 2022-10-29 DIAGNOSIS — N951 Menopausal and female climacteric states: Secondary | ICD-10-CM | POA: Diagnosis not present

## 2022-10-29 DIAGNOSIS — E559 Vitamin D deficiency, unspecified: Secondary | ICD-10-CM | POA: Diagnosis not present

## 2023-01-15 DIAGNOSIS — F411 Generalized anxiety disorder: Secondary | ICD-10-CM | POA: Diagnosis not present

## 2023-02-09 DIAGNOSIS — F411 Generalized anxiety disorder: Secondary | ICD-10-CM | POA: Diagnosis not present

## 2023-03-06 DIAGNOSIS — F411 Generalized anxiety disorder: Secondary | ICD-10-CM | POA: Diagnosis not present

## 2023-03-19 ENCOUNTER — Ambulatory Visit (INDEPENDENT_AMBULATORY_CARE_PROVIDER_SITE_OTHER): Payer: Medicaid Other | Admitting: Family Medicine

## 2023-03-19 ENCOUNTER — Inpatient Hospital Stay (HOSPITAL_COMMUNITY)
Admission: AD | Admit: 2023-03-19 | Discharge: 2023-03-19 | Disposition: A | Discharge status: Home / Self Care | Payer: Medicaid Other | Attending: Obstetrics and Gynecology | Admitting: Obstetrics and Gynecology

## 2023-03-19 ENCOUNTER — Inpatient Hospital Stay (HOSPITAL_COMMUNITY): Payer: Medicaid Other

## 2023-03-19 ENCOUNTER — Encounter (HOSPITAL_COMMUNITY): Payer: Self-pay | Admitting: Obstetrics and Gynecology

## 2023-03-19 ENCOUNTER — Encounter: Payer: Self-pay | Admitting: Family Medicine

## 2023-03-19 VITALS — BP 130/77 | HR 91 | Ht 63.0 in | Wt 262.0 lb

## 2023-03-19 DIAGNOSIS — N912 Amenorrhea, unspecified: Secondary | ICD-10-CM | POA: Diagnosis not present

## 2023-03-19 DIAGNOSIS — R109 Unspecified abdominal pain: Secondary | ICD-10-CM

## 2023-03-19 DIAGNOSIS — Z3A01 Less than 8 weeks gestation of pregnancy: Secondary | ICD-10-CM | POA: Diagnosis not present

## 2023-03-19 DIAGNOSIS — O26891 Other specified pregnancy related conditions, first trimester: Secondary | ICD-10-CM | POA: Insufficient documentation

## 2023-03-19 DIAGNOSIS — O3680X0 Pregnancy with inconclusive fetal viability, not applicable or unspecified: Secondary | ICD-10-CM

## 2023-03-19 DIAGNOSIS — O99321 Drug use complicating pregnancy, first trimester: Secondary | ICD-10-CM | POA: Diagnosis not present

## 2023-03-19 DIAGNOSIS — O23591 Infection of other part of genital tract in pregnancy, first trimester: Secondary | ICD-10-CM | POA: Insufficient documentation

## 2023-03-19 LAB — URINALYSIS, ROUTINE W REFLEX MICROSCOPIC
Bilirubin Urine: NEGATIVE
Glucose, UA: NEGATIVE mg/dL
Hgb urine dipstick: NEGATIVE
Ketones, ur: NEGATIVE mg/dL
Nitrite: NEGATIVE
Protein, ur: NEGATIVE mg/dL
Specific Gravity, Urine: 1.006 (ref 1.005–1.030)
pH: 6 (ref 5.0–8.0)

## 2023-03-19 LAB — WET PREP, GENITAL
Sperm: NONE SEEN
Trich, Wet Prep: NONE SEEN
WBC, Wet Prep HPF POC: <10 (ref <10)
Yeast Wet Prep HPF POC: NONE SEEN

## 2023-03-19 LAB — POCT URINE PREGNANCY: Preg Test, Ur: POSITIVE — AB

## 2023-03-19 LAB — HCG, QUANTITATIVE, PREGNANCY: hCG, Beta Chain, Quant, S: 48802 m[IU]/mL — ABNORMAL HIGH (ref <5)

## 2023-03-19 MED ORDER — METRONIDAZOLE 500 MG PO TABS: 500.0000 mg | ORAL_TABLET | Freq: Two times a day (BID) | ORAL | 0 refills | Status: AC

## 2023-03-19 NOTE — MAU Note (Signed)
.  Chelsea Baldwin is a 32 y.o. at [redacted]w[redacted]d here in MAU reporting: abd pain off an d on for about a week. C/o headache s ell today. Denies any vag bleeding or discharge.  LMP: 01/30/23 Onset of complaint:  1 week Pain score: 8 Vitals:   03/19/23 1037  BP: 114/73  Pulse: 62  Resp: 18  Temp: 98.7 F (37.1 C)     FHT:n/a Lab orders placed from triage:  u/a, wet prep, gc

## 2023-03-19 NOTE — MAU Provider Note (Signed)
History     CSN: [id number]  Arrival date and time: 03/19/23 1011   None     Chief Complaint  Patient presents with   Abdominal Pain   HPI Patient presenting today for evaluation for intermittent abdominal pain for about a week.  Reports she is between 6 and [redacted] weeks pregnant.  Denies any bleeding, gushing of fluid.  Denies any fever or chills, nausea, vomiting.   OB History     Gravida  4   Para  3   Term  3   Preterm  0   AB  0   Living  3      SAB      IAB      Ectopic      Multiple  0   Live Births  3        Obstetric Comments  G2- Emergency due to bradycardia, 2017, delivered at [facility] -- 6 pounds 3 ounces  G1- Boy, 2012, 6 pounds 1 ounce - Severe anemia in G1 pregnancy          Past Medical History:  Diagnosis Date   Anemia of pregnancy 06/02/2018   COVID-19 vaccine administered 02/08/2021   Encounter for administration of COVID-19 vaccine 02/07/2021   Previous cesarean delivery, antepartum 01/01/2020   Desires TOLAC, signed TOLAC consent 01/01/20     Screening for cervical cancer 10/12/2020   VBAC, delivered     Past Surgical History:  Procedure Laterality Date   CESAREAN SECTION  2017   G2    TONSILLECTOMY      No family history on file.  Social History   Tobacco Use   Smoking status: Former    Types: Cigarettes   Smokeless tobacco: Never  Vaping Use   Vaping status: Never Used  Substance Use Topics   Alcohol use: Not Currently   Drug use: Not Currently    Types: Marijuana    Allergies: No Known Allergies  Medications Prior to Admission  Medication Sig Dispense Refill Last Dose   doxycycline (VIBRA-TABS) 100 MG tablet Take 1 tablet (100 mg total) by mouth 2 (two) times daily. (Patient not taking: Reported on 03/19/2023) 14 tablet 0 Not Taking   fluticasone (FLONASE) 50 MCG/ACT nasal spray Place 2 sprays into both nostrils daily. (Patient not taking: Reported on 12/29/2019) 16 g 6     ibuprofen (ADVIL) 600 MG tablet Take 1 tablet (600 mg total) by mouth every 6 (six) hours. (Patient not taking: Reported on 03/19/2023) 30 tablet 0 Not Taking   metroNIDAZOLE (METROGEL VAGINAL) 0.75 % vaginal gel Place 1 Applicatorful vaginally 2 (two) times daily. (Patient not taking: Reported on 03/19/2023) 70 g 0 Not Taking   metroNIDAZOLE (METROGEL) 0.75 % gel Apply 1 application topically 2 (two) times daily. (Patient not taking: Reported on 03/19/2023) 45 g 0 Not Taking   Prenatal Vit-Fe Fum-FA-Omega (PRENATAL FORMULA) 28-0.8-235 MG CAPS Take 1 tablet by mouth daily. (Patient not taking: Reported on 03/19/2023) 30 capsule  Not Taking   Prenatal Vit-Fe Fumarate-FA (PRENATAL VITAMINS) 28-0.8 MG TABS Take 1 tablet by mouth daily. 30 tablet 11    triamcinolone cream (KENALOG) 0.1 % Apply 1 application topically 2 (two) times daily. (Patient not taking: Reported on 12/29/2019) 30 g 0    valACYclovir (VALTREX) 500 MG tablet Take 1 tablet (500 mg total) by mouth 2 (two) times daily. (Patient not taking: Reported on 03/19/2023) 60 tablet 0 Not Taking    Review of Systems  Gastrointestinal:  Positive for abdominal pain. Negative for constipation, diarrhea, nausea and vomiting.  All other systems reviewed and are negative.  Physical Exam   Blood pressure 114/73, pulse 62, temperature 98.7 F (37.1 C), resp. rate 18, last menstrual period 01/30/2023, currently breastfeeding.  Physical Exam Vitals reviewed.  Constitutional:      Appearance: She is well-developed.  HENT:     Head: Normocephalic and atraumatic.  Eyes:     Extraocular Movements: Extraocular movements intact.  Cardiovascular:     Rate and Rhythm: Normal rate.  Pulmonary:     Effort: Pulmonary effort is normal.  Abdominal:     General: There is no distension.     Palpations: Abdomen is soft.     Tenderness: There is no abdominal tenderness.     Hernia: No hernia is present.  Skin:    General: Skin is warm.     Capillary Refill:  Capillary refill takes less than 2 seconds.  Neurological:     General: No focal deficit present.  Psychiatric:        Mood and Affect: Mood normal.     MAU Course  Procedures  MDM Urinalysis Urine Prag CBC Wet prep Ultrasound Beta-hCG quant  Assessment and Plan  Chelsea Baldwin is a 32 yo G4P3 at [redacted]w[redacted]d presenting for intermittent abdominal pain for the last week.  Abdominal pain Positive pregnancy test.  Urinalysis reassuring.  CBC within normal limits.  Wet prep showing BV.  BV treatment sent to patient's pharmacy.  Ultrasound showing viable IUP.  Beta-hCG preop.  Physical exam was reassuring.  Discussed establishing with primary OB provider.  Patient discharged home without any questions or concerns.    Celedonio Savage 03/19/2023, 12:50 PM

## 2023-03-19 NOTE — Discharge Instructions (Signed)
It was a pleasure seeing you today.  Your ultrasound showed you have a viable fetus.  You do have bacterial vaginosis and I have sent a prescription for metronidazole to your pharmacy.  You will take this twice daily for 7 days.  If you have any issues, your symptoms worsen, you have any bleeding or gushing of fluid please return for further evaluation.  I hope you have a great afternoon!

## 2023-03-19 NOTE — Progress Notes (Signed)
    SUBJECTIVE:   CHIEF COMPLAINT / HPI:   Pregnancy test LMP started 01/30/23, was normal. Desired pregnancy though not expected. 4-5 days ago last sexually active with 1 female partner. Currently separated but now with new partner. Would like to follow with center for women for this pregnancy. Has had nausea, abd pain, back pain already in addition to tingling in her hands and feet. Has also had HA though not much changed from prior. These symptoms did not come up until later in her prior pregnancies (last 01/15/20). Has used PT in the past for this pain, said it was sciatica. Has history of elevated BP toward end of last pregnancy, as well. Not on prenatal vitamin yet, needs Rx. No LOF, bleeding, CP, SOB.   PERTINENT  PMH / PSH: G3P3003, THC use, low back pain, Hgb C trait, anemia  OBJECTIVE:   BP 130/77   Pulse 91   Ht 5\' 3"  (1.6 m)   Wt 262 lb (118.8 kg)   LMP 01/30/2023   SpO2 99%   BMI 46.41 kg/m   General: Alert and oriented, in NAD Skin: Warm, dry, and intact without lesions HEENT: NCAT, EOM grossly normal, midline nasal septum Cardiac: RRR, no m/r/g appreciated Respiratory: CTAB, breathing and speaking comfortably on RA Abdominal: Soft, nontender, nondistended, normoactive bowel sounds Extremities: Moves all extremities grossly equally Neurological: No gross focal deficit Psychiatric: Appropriate mood and affect   ASSESSMENT/PLAN:   Positive pregnancy test Sent patient to MAU today given abdominal pain and back pain in early first trimester to rule out ectopic pregnancy. Would recommend bHCG quantitative. Referred to [facility] for further routine pregnancy care. Will need initial OB appointment and labs at that time. Return precautions of LOF, bleeding, increased cramping/pain, or CP/SOB/increased HA discussed.  Chelsea Holmes, MD [facility]

## 2023-03-19 NOTE — Patient Instructions (Addendum)
It was great to see you today! Here's what we talked about:  I am sending you to the MAU over at [facility] . This is to get an ultrasound and possibly some labs to make sure your pregnancy is in the right place given you have this pain early on. I have referred you to the center for women for further pregnancy care. Let me know if there is anything else I can do for you in the meantime. Be sure to take your prenatal vitamin if everything looks good at the MAU. I have sent this in.  Please let me know if you have any other questions.  Dr. Phineas Real

## 2023-04-10 ENCOUNTER — Telehealth (INDEPENDENT_AMBULATORY_CARE_PROVIDER_SITE_OTHER): Payer: Medicaid Other

## 2023-04-10 DIAGNOSIS — Z3491 Encounter for supervision of normal pregnancy, unspecified, first trimester: Secondary | ICD-10-CM

## 2023-04-10 DIAGNOSIS — O0990 Supervision of high risk pregnancy, unspecified, unspecified trimester: Secondary | ICD-10-CM | POA: Insufficient documentation

## 2023-04-10 DIAGNOSIS — Z3480 Encounter for supervision of other normal pregnancy, unspecified trimester: Secondary | ICD-10-CM

## 2023-04-10 DIAGNOSIS — B009 Herpesviral infection, unspecified: Secondary | ICD-10-CM

## 2023-04-10 HISTORY — DX: Herpesviral infection, unspecified: B00.9

## 2023-04-10 MED ORDER — PRENATAL VITAMINS 28-0.8 MG PO TABS: 1.0000 | ORAL_TABLET | Freq: Every day | ORAL | 11 refills | Status: DC

## 2023-04-10 NOTE — Progress Notes (Signed)
New OB Intake  I connected with Chelsea Baldwin  on 04/10/23 at  3:15 PM EDT by MyChart Video Visit and verified that I am speaking with the correct person using two identifiers. Nurse is located at [facility] and pt is located at home.  I discussed the limitations, risks, security and privacy concerns of performing an evaluation and management service by telephone and the availability of in person appointments. I also discussed with the patient that there may be a patient responsible charge related to this service. The patient expressed understanding and agreed to proceed.  I explained I am completing New OB Intake today. We discussed EDD of 11/06/2023, by Last Menstrual Period. Pt is G4P3003. I reviewed her allergies, medications and Medical/Surgical/OB history.    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 Discussed the nature of our practice with multiple providers including residents and students. Due to the size of the practice, the delivering provider may not be the same as those providing prenatal care.   Patient is not interested in water birth. Offered upcoming OB visit with CNM to discuss further.  MyChart/Babyscripts MyChart access verified. I explained pt will have some visits in office and some virtually. Babyscripts instructions given and order placed. Patient verifies receipt of registration text/e-mail. Account successfully created and app downloaded.  Blood Pressure Cuff/Weight Scale Blood pressure  cuff ordered for patient to pick-up from [employer]. Explained after first prenatal appt pt will check weekly and document in Babyscripts. Patient does have weight scale.  Anatomy US Explained first scheduled [place] will be around 19 weeks. Anatomy US scheduled for 06/12/23  at  1215p  .  Is patient a CenteringPregnancy candidate?  Accepted   If accepted,    Is patient a Mom+Baby Combined Care candidate?  Not a candidate   If accepted, confirm patient does not intend to move from the area for at least 12 months, then notify Mom+Baby staff  Interested in [place]? If yes, send referral and doula dot phrase.   Is patient a candidate for Babyscripts Optimization? No - Centering  First visit review I reviewed new OB appt with patient. Explained pt will be seen by Dr. Alysia Penna at first visit. Discussed [name] genetic screening with patient. needs Panorama and Horizon.. Routine prenatal labs  needed at [facility] OB visit.    Last Pap Diagnosis  Date Value Ref Range Status  10/10/2020   Final   - Negative for intraepithelial lesion or malignancy (NILM)    Chelsea Baldwin, CMA 04/10/2023  3:58 PM

## 2023-04-15 ENCOUNTER — Encounter: Payer: Self-pay | Admitting: *Deleted

## 2023-04-17 DIAGNOSIS — F411 Generalized anxiety disorder: Secondary | ICD-10-CM | POA: Diagnosis not present

## 2023-04-18 ENCOUNTER — Ambulatory Visit (INDEPENDENT_AMBULATORY_CARE_PROVIDER_SITE_OTHER): Payer: Medicaid Other | Admitting: Obstetrics and Gynecology

## 2023-04-18 ENCOUNTER — Other Ambulatory Visit: Payer: Self-pay

## 2023-04-18 ENCOUNTER — Other Ambulatory Visit (HOSPITAL_COMMUNITY)
Admission: RE | Admit: 2023-04-18 | Discharge: 2023-04-18 | Disposition: A | Discharge status: Home / Self Care | Payer: Medicaid Other | Source: Ambulatory Visit | Attending: Obstetrics and Gynecology | Admitting: Obstetrics and Gynecology

## 2023-04-18 ENCOUNTER — Encounter: Payer: Self-pay | Admitting: Obstetrics and Gynecology

## 2023-04-18 ENCOUNTER — Other Ambulatory Visit: Payer: Self-pay | Admitting: Obstetrics and Gynecology

## 2023-04-18 VITALS — BP 108/73 | HR 82 | Wt 250.0 lb

## 2023-04-18 DIAGNOSIS — B009 Herpesviral infection, unspecified: Secondary | ICD-10-CM

## 2023-04-18 DIAGNOSIS — Z3480 Encounter for supervision of other normal pregnancy, unspecified trimester: Secondary | ICD-10-CM | POA: Diagnosis not present

## 2023-04-18 DIAGNOSIS — Z3481 Encounter for supervision of other normal pregnancy, first trimester: Secondary | ICD-10-CM

## 2023-04-18 DIAGNOSIS — D508 Other iron deficiency anemias: Secondary | ICD-10-CM | POA: Diagnosis not present

## 2023-04-18 DIAGNOSIS — F1210 Cannabis abuse, uncomplicated: Secondary | ICD-10-CM

## 2023-04-18 DIAGNOSIS — Z3A11 11 weeks gestation of pregnancy: Secondary | ICD-10-CM

## 2023-04-18 DIAGNOSIS — K5900 Constipation, unspecified: Secondary | ICD-10-CM

## 2023-04-18 NOTE — Progress Notes (Addendum)
Subjective:   Chelsea Baldwin is a 32 y.o. G4P3003 at [redacted]w[redacted]d by LMP being seen today for her first obstetrical visit.  Her obstetrical history is significant for  hx of LTCS and successful VBAC, hx of GHTN . Patient does intend to breast feed. Pregnancy history fully reviewed.  Patient reports  dry itchy skin .  HISTORY: OB History  Gravida Para Term Preterm AB Living  4 3 3  0 0 3  SAB IAB Ectopic Multiple Live Births  0 0 0 0 3    # Outcome Date GA Lbr Len/2nd Weight Sex Type Anes PTL Lv  4 Current           3 Term 01/15/20 [redacted]w[redacted]d 12:03 / 00:11 7 lb 8.6 oz (3.419 kg) M VBAC None  LIV     Name: MAYIM, SHAFF     Apgar1: 9  Apgar5: 9  2 Term 2017     CS-LTranv   LIV     Birth Comments: Emergency due to bradycardia, 2017, delivered at [facility] -- 6 pounds 3 ounces   1 Term 2016     Vag-Spont   LIV     Birth Comments: Boy, 2012, 6 pounds 1 ounce, severe anemia during pregnancy    Obstetric Comments  G2- Emergency due to bradycardia, 2017, delivered at [facility] -- 6 pounds 3 ounces   G1- Boy, 2012, 6 pounds 1 ounce  - Severe anemia in G1 pregnancy    Last pap smear was  10/10/2020 and was normal Past Medical History:  Diagnosis Date   Anemia of pregnancy 06/02/2018   COVID-19 vaccine administered 02/08/2021   Encounter for administration of COVID-19 vaccine 02/07/2021   HSV (herpes simplex virus) infection 04/10/2023   Previous cesarean delivery, antepartum 01/01/2020   Desires TOLAC, signed TOLAC consent 01/01/20     Screening for cervical cancer 10/12/2020   VBAC, delivered    Past Surgical History:  Procedure Laterality Date   CESAREAN SECTION  2017   G2    TONSILLECTOMY     No family history on file. Social History   Tobacco Use   Smoking status: Former    Types: Cigarettes   Smokeless tobacco: Never  Vaping Use   Vaping status: Never Used  Substance Use Topics   Alcohol use: Not  Currently   Drug use: Not Currently    Types: Marijuana   No Known Allergies Current Outpatient Medications on File Prior to Visit  Medication Sig Dispense Refill   ferrous sulfate 324 MG TBEC Take 324 mg by mouth.     Prenatal Vit-Fe Fumarate-FA (PRENATAL VITAMINS) 28-0.8 MG TABS Take 1 tablet by mouth daily. 30 tablet 11   fluticasone (FLONASE) 50 MCG/ACT nasal spray Place 2 sprays into both nostrils daily. (Patient not taking: Reported on 12/29/2019) 16 g 6   Prenatal Vit-Fe Fum-FA-Omega (PRENATAL FORMULA) 28-0.8-235 MG CAPS Take 1 tablet by mouth daily. (Patient not taking: Reported on 03/19/2023) 30 capsule    triamcinolone cream (KENALOG) 0.1 % Apply 1 application topically 2 (two) times daily. (Patient not taking: Reported on 12/29/2019) 30 g 0   No current facility-administered medications on file prior to visit.     Exam   Vitals:   04/18/23 1013  BP: 108/73  Pulse: 82  Weight: 250 lb (113.4 kg)   Fetal Heart Rate (bpm): 150 by handheld US  Uterus:     Pelvic Exam: Perineum: no hemorrhoids, normal perineum  Vulva: normal external genitalia, no lesions   Vagina:  normal mucosa, normal discharge   Cervix: no lesions and normal, pap smear done.    Adnexa: normal adnexa and no mass, fullness, tenderness   Bony Pelvis: average  System: General: well-developed, well-nourished female in no acute distress   Breast:  normal appearance, no masses or tenderness   Skin: normal coloration and turgor, no rashes   Neurologic: oriented, normal, negative, normal mood   Extremities: normal strength, tone, and muscle mass, ROM of all joints is normal   HEENT PERRLA, extraocular movement intact and sclera clear, anicteric   Mouth/Teeth mucous membranes moist, pharynx normal without lesions and dental hygiene good   Neck supple and no masses   Cardiovascular: regular rate and rhythm   Respiratory:  no respiratory distress, normal breath sounds   Abdomen: soft, non-tender; bowel sounds  normal; no masses,  no organomegaly     Assessment:   Pregnancy: [id number] Patient Active Problem List   Diagnosis Date Noted   Supervision of other normal pregnancy, antepartum 04/10/2023   HSV (herpes simplex virus) infection 04/10/2023   Encounter for contraceptive management 01/13/2022   Gestational hypertension 01/15/2020   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 Supervision of other normal pregnancy, antepartum   [redacted] weeks gestation Desires TOLAC, breast feeding, nexplanon in hospital, gender reveal envelop - Culture, OB Urine - GC/Chlamydia probe amp ([place])not at [facility] - CBC/D/Plt+RPR+Rh+ABO+RubIgG... - Hemoglobin A1c - PANORAMA PRENATAL TEST - HORIZON Basic Panel   Initial labs drawn. Continue prenatal vitamins. Genetic Screening discussed, First trimester screen, Quad screen, and NIPS: ordered. Ultrasound discussed; fetal anatomic survey: ordered. Problem list reviewed and updated. The nature of [facility] with multiple MDs and other Advanced Practice Providers was explained to patient; also emphasized that residents, students are part of our team. Routine obstetric precautions reviewed. No follow-ups on file.    Wyn Forster, MD FMOB Fellow, Faculty practice [facility], Center for [facility]

## 2023-04-19 LAB — COMPREHENSIVE METABOLIC PANEL
ALT: 37 IU/L — ABNORMAL HIGH (ref 0–32)
AST: 22 IU/L (ref 0–40)
Albumin: 3.9 g/dL (ref 3.9–4.9)
Alkaline Phosphatase: 58 IU/L (ref 44–121)
BUN/Creatinine Ratio: 9 (ref 9–23)
BUN: 7 mg/dL (ref 6–20)
Bilirubin Total: 0.2 mg/dL (ref 0.0–1.2)
CO2: 20 mmol/L (ref 20–29)
Calcium: 9.6 mg/dL (ref 8.7–10.2)
Chloride: 102 mmol/L (ref 96–106)
Creatinine, Ser: 0.81 mg/dL (ref 0.57–1.00)
Globulin, Total: 3.3 g/dL (ref 1.5–4.5)
Glucose: 80 mg/dL (ref 70–99)
Potassium: 4.1 mmol/L (ref 3.5–5.2)
Sodium: 137 mmol/L (ref 134–144)
Total Protein: 7.2 g/dL (ref 6.0–8.5)
eGFR: 99 mL/min/{1.73_m2} (ref >59)

## 2023-04-19 LAB — CBC/D/PLT+RPR+RH+ABO+RUBIGG...
Antibody Screen: NEGATIVE
Basophils Absolute: 0 10*3/uL (ref 0.0–0.2)
Basos: 0 %
EOS (ABSOLUTE): 0.1 10*3/uL (ref 0.0–0.4)
Eos: 1 %
HCV Ab: NONREACTIVE
HIV Screen 4th Generation wRfx: NONREACTIVE
Hematocrit: 34.2 % (ref 34.0–46.6)
Hemoglobin: 11.4 g/dL (ref 11.1–15.9)
Hepatitis B Surface Ag: NEGATIVE
Immature Grans (Abs): 0 10*3/uL (ref 0.0–0.1)
Immature Granulocytes: 0 %
Lymphocytes Absolute: 2.3 10*3/uL (ref 0.7–3.1)
Lymphs: 31 %
MCH: 27.5 pg (ref 26.6–33.0)
MCHC: 33.3 g/dL (ref 31.5–35.7)
MCV: 83 fL (ref 79–97)
Monocytes Absolute: 0.3 10*3/uL (ref 0.1–0.9)
Monocytes: 4 %
Neutrophils Absolute: 4.8 10*3/uL (ref 1.4–7.0)
Neutrophils: 64 %
Platelets: 242 10*3/uL (ref 150–450)
RBC: 4.14 x10E6/uL (ref 3.77–5.28)
RDW: 14.1 % (ref 11.7–15.4)
RPR Ser Ql: NONREACTIVE
Rh Factor: POSITIVE
Rubella Antibodies, IGG: 5.68 {index} (ref >0.99)
WBC: 7.6 10*3/uL (ref 3.4–10.8)

## 2023-04-19 LAB — HEMOGLOBIN A1C
Est. average glucose Bld gHb Est-mCnc: 100 mg/dL
Hgb A1c MFr Bld: 5.1 % (ref 4.8–5.6)

## 2023-04-19 LAB — PROTEIN / CREATININE RATIO, URINE
Creatinine, Urine: 347.5 mg/dL
Protein, Ur: 21.5 mg/dL
Protein/Creat Ratio: 62 mg/g{creat} (ref 0–200)

## 2023-04-19 LAB — HCV INTERPRETATION

## 2023-04-19 LAB — TSH: TSH: 0.766 u[IU]/mL (ref 0.450–4.500)

## 2023-04-19 MED ORDER — POLYETHYLENE GLYCOL 3350 17 GM/SCOOP PO POWD: 17.0000 g | Freq: Once | ORAL | 0 refills | Status: AC

## 2023-04-20 LAB — CULTURE, OB URINE

## 2023-04-20 LAB — URINE CULTURE, OB REFLEX

## 2023-04-22 MED ORDER — AMOXICILLIN 500 MG PO CAPS
500.0000 mg | ORAL_CAPSULE | Freq: Three times a day (TID) | ORAL | 0 refills | Status: AC
Start: 2023-04-22 — End: 2023-04-27

## 2023-04-22 NOTE — Addendum Note (Signed)
Addended by: Barnet Pall on: 04/22/2023 12:51 AM   Modules accepted: Orders

## 2023-04-25 LAB — GC/CHLAMYDIA PROBE AMP (~~LOC~~) NOT AT ARMC
Chlamydia: NEGATIVE
Comment: NEGATIVE
Comment: NORMAL
Neisseria Gonorrhea: NEGATIVE

## 2023-04-27 LAB — PANORAMA PRENATAL TEST FULL PANEL:PANORAMA TEST PLUS 5 ADDITIONAL MICRODELETIONS: FETAL FRACTION: 6.7

## 2023-04-27 LAB — HORIZON CUSTOM: REPORT SUMMARY: POSITIVE — AB

## 2023-05-01 ENCOUNTER — Ambulatory Visit (INDEPENDENT_AMBULATORY_CARE_PROVIDER_SITE_OTHER): Payer: Medicaid Other | Admitting: Family Medicine

## 2023-05-01 ENCOUNTER — Other Ambulatory Visit: Payer: Self-pay

## 2023-05-01 ENCOUNTER — Encounter: Payer: Self-pay | Admitting: Family Medicine

## 2023-05-01 VITALS — BP 117/79 | HR 79 | Wt 253.6 lb

## 2023-05-01 DIAGNOSIS — Z3480 Encounter for supervision of other normal pregnancy, unspecified trimester: Secondary | ICD-10-CM

## 2023-05-01 DIAGNOSIS — Z3A13 13 weeks gestation of pregnancy: Secondary | ICD-10-CM | POA: Diagnosis not present

## 2023-05-01 DIAGNOSIS — Z98891 History of uterine scar from previous surgery: Secondary | ICD-10-CM | POA: Insufficient documentation

## 2023-05-01 DIAGNOSIS — O34219 Maternal care for unspecified type scar from previous cesarean delivery: Secondary | ICD-10-CM | POA: Diagnosis not present

## 2023-05-01 DIAGNOSIS — B009 Herpesviral infection, unspecified: Secondary | ICD-10-CM

## 2023-05-01 NOTE — Patient Instructions (Signed)

## 2023-05-01 NOTE — Progress Notes (Signed)
   PRENATAL VISIT NOTE;Centering Pregnancy Group 15, Session 1   Subjective:  Chelsea Baldwin is a 32 y.o. G4P3003 at [redacted]w[redacted]d being seen today for ongoing prenatal care.  She is currently monitored for the following issues for this high-risk pregnancy and has Anemia; Mild tetrahydrocannabinol (THC) abuse; Hemoglobin C trait (HCC); History of gestational hypertension; Supervision of other normal pregnancy, antepartum; HSV (herpes simplex virus) infection; and Previous cesarean delivery affecting pregnancy, antepartum on their problem list.  Patient reports no complaints.  Contractions: Not present. Vag. Bleeding: None.  Movement: Absent. Denies leaking of fluid.   The following portions of the patient's history were reviewed and updated as appropriate: allergies, current medications, past family history, past medical history, past social history, past surgical history and problem list.   Objective:   Vitals:   05/01/23 1150  BP: 117/79  Pulse: 79  Weight: 253 lb 9.6 oz (115 kg)    Fetal Status: Fetal Heart Rate (bpm): 145   Movement: Absent     General:  Alert, oriented and cooperative. Patient is in no acute distress.  Skin: Skin is warm and dry. No rash noted.   Cardiovascular: Normal heart rate noted  Respiratory: Normal respiratory effort, no problems with respiration noted  Abdomen: Soft, gravid, appropriate for gestational age.  Pain/Pressure: Absent     Pelvic: Cervical exam deferred        Extremities: Normal range of motion.     Mental Status: Normal mood and affect. Normal behavior. Normal judgment and thought content.   Assessment and Plan:  Pregnancy: G4P3003 at [redacted]w[redacted]d  1. Supervision of other normal pregnancy, antepartum   Centering Pregnancy, Session#1: Introduction to model of care. Group determined rules for self-governance and closing phrase. Oriented group to space and mother's notebook.   Facilitated discussion today:  common discomforts, When to call  practice  Mindfulness activity completed as well as introduction to deep breathing for childbirth preparation- Centering 3 Breaths  Fundal height and FHR appropriate today unless noted otherwise in plan of care. Patient to continue group care.    2. HSV (herpes simplex virus) infection PPX at 34 wks  3. Previous cesarean delivery affecting pregnancy, antepartum Will discuss TOLAC at future visits G1 SVD, G2 CS, G3VBAC  Preterm labor symptoms and general obstetric precautions including but not limited to vaginal bleeding, contractions, leaking of fluid and fetal movement were reviewed in detail with the patient. Please refer to After Visit Summary for other counseling recommendations.   Return in about 4 weeks (around 05/29/2023) for Centering Pregnancy.  Future Appointments  Date Time Provider Department Center  05/29/2023  9:00 AM CENTERING PROVIDER [facility]  06/12/2023 12:15 PM [facility] NURSE [facility]  06/12/2023 12:30 PM [facility] US2 [facility]  06/26/2023  9:00 AM CENTERING PROVIDER [facility]  07/10/2023  9:00 AM CENTERING PROVIDER [facility]  07/24/2023  9:00 AM CENTERING PROVIDER [facility]  08/07/2023  9:00 AM CENTERING PROVIDER [facility]  08/28/2023  9:00 AM CENTERING PROVIDER [facility]  09/04/2023  9:00 AM CENTERING PROVIDER [facility]  09/18/2023  9:00 AM CENTERING PROVIDER [facility]  10/02/2023  9:00 AM CENTERING PROVIDER [facility]    Federico Flake, MD

## 2023-05-08 ENCOUNTER — Other Ambulatory Visit: Payer: Self-pay

## 2023-05-08 ENCOUNTER — Other Ambulatory Visit (HOSPITAL_COMMUNITY)
Admission: RE | Admit: 2023-05-08 | Discharge: 2023-05-08 | Disposition: A | Discharge status: Home / Self Care | Payer: Medicaid Other | Source: Ambulatory Visit | Attending: Family Medicine | Admitting: Family Medicine

## 2023-05-08 ENCOUNTER — Ambulatory Visit (INDEPENDENT_AMBULATORY_CARE_PROVIDER_SITE_OTHER): Payer: Medicaid Other | Admitting: *Deleted

## 2023-05-08 VITALS — BP 102/70 | HR 79 | Wt 254.4 lb

## 2023-05-08 DIAGNOSIS — N898 Other specified noninflammatory disorders of vagina: Secondary | ICD-10-CM | POA: Diagnosis not present

## 2023-05-08 DIAGNOSIS — Z3A14 14 weeks gestation of pregnancy: Secondary | ICD-10-CM

## 2023-05-08 DIAGNOSIS — O34219 Maternal care for unspecified type scar from previous cesarean delivery: Secondary | ICD-10-CM

## 2023-05-08 DIAGNOSIS — Z3480 Encounter for supervision of other normal pregnancy, unspecified trimester: Secondary | ICD-10-CM

## 2023-05-08 NOTE — Progress Notes (Signed)
Here for self swab for c/o vaginal itching that started after 3rd day of antibiotic. Wants to do self swab for yeast and BV, denies need to std swab, had std swab in August. Explained if positive she will be contacted with treatment. She voices understanding. Unable to obtain FHR with doppler. Marylynn Pearson ,RN in and obtained [facility] with bedside handheld ultrasound. Nancy Fetter

## 2023-05-10 ENCOUNTER — Other Ambulatory Visit: Payer: Self-pay | Admitting: Family Medicine

## 2023-05-10 DIAGNOSIS — B379 Candidiasis, unspecified: Secondary | ICD-10-CM

## 2023-05-10 DIAGNOSIS — B9689 Other specified bacterial agents as the cause of diseases classified elsewhere: Secondary | ICD-10-CM

## 2023-05-10 LAB — CERVICOVAGINAL ANCILLARY ONLY
Bacterial Vaginitis (gardnerella): POSITIVE — AB
Candida Glabrata: NEGATIVE
Candida Vaginitis: POSITIVE — AB
Comment: NEGATIVE
Comment: NEGATIVE
Comment: NEGATIVE

## 2023-05-10 MED ORDER — METRONIDAZOLE 500 MG PO TABS
500.0000 mg | ORAL_TABLET | Freq: Two times a day (BID) | ORAL | 0 refills | Status: DC
Start: 2023-05-10 — End: 2023-07-01

## 2023-05-10 MED ORDER — FLUCONAZOLE 150 MG PO TABS
150.0000 mg | ORAL_TABLET | Freq: Once | ORAL | 0 refills | Status: AC
Start: 2023-05-10 — End: 2023-05-10

## 2023-05-11 DIAGNOSIS — F411 Generalized anxiety disorder: Secondary | ICD-10-CM | POA: Diagnosis not present

## 2023-05-13 ENCOUNTER — Telehealth: Payer: Self-pay

## 2023-05-13 NOTE — Telephone Encounter (Addendum)
-----   Message from Federico Flake sent at 05/10/2023 12:24 PM EDT ----- BV and Yeast present.  I have sent in medication for the patient. Please follow up  LM to return call for results.    Leonette Nutting  05/13/23

## 2023-05-14 NOTE — Telephone Encounter (Signed)
Patient called and left message on nurse voicemail line stating she is returning phone call. Per chart review, patient has already received message via mychart.   Called patient, no answer- left message stating we are returning her phone call. Stated we were calling to review results but she has already received them via mychart. She may call us back if she has questions.

## 2023-05-21 DIAGNOSIS — F411 Generalized anxiety disorder: Secondary | ICD-10-CM | POA: Diagnosis not present

## 2023-05-28 DIAGNOSIS — F411 Generalized anxiety disorder: Secondary | ICD-10-CM | POA: Diagnosis not present

## 2023-05-29 ENCOUNTER — Other Ambulatory Visit: Payer: Self-pay

## 2023-05-29 ENCOUNTER — Ambulatory Visit (INDEPENDENT_AMBULATORY_CARE_PROVIDER_SITE_OTHER): Payer: Medicaid Other | Admitting: Family Medicine

## 2023-05-29 ENCOUNTER — Other Ambulatory Visit (HOSPITAL_COMMUNITY)
Admission: RE | Admit: 2023-05-29 | Discharge: 2023-05-29 | Disposition: A | Discharge status: Home / Self Care | Payer: Medicaid Other | Source: Ambulatory Visit | Attending: Family Medicine | Admitting: Family Medicine

## 2023-05-29 VITALS — BP 119/79 | HR 89 | Wt 253.6 lb

## 2023-05-29 DIAGNOSIS — Z23 Encounter for immunization: Secondary | ICD-10-CM

## 2023-05-29 DIAGNOSIS — N898 Other specified noninflammatory disorders of vagina: Secondary | ICD-10-CM

## 2023-05-29 DIAGNOSIS — Z3480 Encounter for supervision of other normal pregnancy, unspecified trimester: Secondary | ICD-10-CM | POA: Diagnosis not present

## 2023-05-29 DIAGNOSIS — Z3A17 17 weeks gestation of pregnancy: Secondary | ICD-10-CM

## 2023-05-29 DIAGNOSIS — O26899 Other specified pregnancy related conditions, unspecified trimester: Secondary | ICD-10-CM | POA: Insufficient documentation

## 2023-05-29 DIAGNOSIS — B009 Herpesviral infection, unspecified: Secondary | ICD-10-CM

## 2023-05-29 DIAGNOSIS — D508 Other iron deficiency anemias: Secondary | ICD-10-CM

## 2023-05-29 DIAGNOSIS — O26892 Other specified pregnancy related conditions, second trimester: Secondary | ICD-10-CM

## 2023-05-29 DIAGNOSIS — O34219 Maternal care for unspecified type scar from previous cesarean delivery: Secondary | ICD-10-CM | POA: Diagnosis not present

## 2023-05-29 NOTE — Progress Notes (Signed)
   PRENATAL VISIT NOTE: Centering Pregnancy Group 15, Session 2   Subjective:  Chelsea Baldwin is a 32 y.o. G4P3003 at [redacted]w[redacted]d being seen today for ongoing prenatal care.  She is currently monitored for the following issues for this low-risk pregnancy and has Anemia; Mild tetrahydrocannabinol (THC) abuse; Hemoglobin C trait (HCC); History of gestational hypertension; Supervision of other normal pregnancy, antepartum; HSV (herpes simplex virus) infection; and Previous cesarean delivery affecting pregnancy, antepartum on their problem list.  Patient reports vaginal itching and irritation.  Contractions: Not present. Vag. Bleeding: None.  Movement: Present. Denies leaking of fluid.   The following portions of the patient's history were reviewed and updated as appropriate: allergies, current medications, past family history, past medical history, past social history, past surgical history and problem list.   Objective:   Vitals:   05/29/23 0851  BP: 119/79  Pulse: 89  Weight: 253 lb 9.6 oz (115 kg)    Fetal Status: Fetal Heart Rate (bpm): 140 Fundal Height: 17 cm Movement: Present     General:  Alert, oriented and cooperative. Patient is in no acute distress.  Skin: Skin is warm and dry. No rash noted.   Cardiovascular: Normal heart rate noted  Respiratory: Normal respiratory effort, no problems with respiration noted  Abdomen: Soft, gravid, appropriate for gestational age.  Pain/Pressure: Absent     Pelvic: Cervical exam deferred        Extremities: Normal range of motion.     Mental Status: Normal mood and affect. Normal behavior. Normal judgment and thought content.   Assessment and Plan:  Pregnancy: G4P3003 at [redacted]w[redacted]d 1. Supervision of other normal pregnancy, antepartum  Centering Pregnancy, Session#2: Reviewed rules for self-governance with group. Direct group to [employer].   Facilitated discussion today:  Nutrition, Back pain, Genetic screening options with AFP/Quad.    Mindfulness activity completed as well as deep breathing with 1 minutes of tension and 1 minute of relaxation for childbirth preparation.  Also participated in mindful eating activity AFP today  Fundal height and FHR appropriate today unless noted otherwise in plan. Patient to continue group care.    2. Previous cesarean delivery affecting pregnancy, antepartum Discuss TOLAC SVD--> CS for bradycardia-->VBAC  3. HSV (herpes simplex virus) infection PPX at 36 wks  4. Other iron deficiency anemia Lab Results  Component Value Date   HGB 11.4 04/18/2023   HGB 12.3 10/20/2021    5. [redacted] weeks gestation of pregnancy Vaginal itching, swab today and will treat per results  Preterm labor symptoms and general obstetric precautions including but not limited to vaginal bleeding, contractions, leaking of fluid and fetal movement were reviewed in detail with the patient. Please refer to After Visit Summary for other counseling recommendations.   Return in about 4 weeks (around 06/26/2023) for Routine prenatal care, Centering Pregnancy.  Future Appointments  Date Time Provider Department Center  06/12/2023 12:15 PM [facility] NURSE [facility]  06/12/2023 12:30 PM [facility] US2 [facility]  06/26/2023  9:00 AM CENTERING PROVIDER [facility]  07/10/2023  9:00 AM CENTERING PROVIDER [facility]  07/24/2023  9:00 AM CENTERING PROVIDER [facility]  08/07/2023  8:20 AM WMC-WOCA LAB [facility]  08/07/2023  9:00 AM CENTERING PROVIDER [facility]  08/28/2023  9:00 AM CENTERING PROVIDER [facility]  09/04/2023  9:00 AM CENTERING PROVIDER [facility]  09/18/2023  9:00 AM CENTERING PROVIDER [facility]  10/02/2023  9:00 AM CENTERING PROVIDER [facility]    Federico Flake, MD

## 2023-05-30 LAB — CERVICOVAGINAL ANCILLARY ONLY
Bacterial Vaginitis (gardnerella): NEGATIVE
Candida Glabrata: NEGATIVE
Candida Vaginitis: POSITIVE — AB
Chlamydia: NEGATIVE
Comment: NEGATIVE
Comment: NEGATIVE
Comment: NEGATIVE
Comment: NEGATIVE
Comment: NEGATIVE
Comment: NORMAL
Neisseria Gonorrhea: NEGATIVE
Trichomonas: NEGATIVE

## 2023-05-31 DIAGNOSIS — F411 Generalized anxiety disorder: Secondary | ICD-10-CM | POA: Diagnosis not present

## 2023-05-31 LAB — AFP, SERUM, OPEN SPINA BIFIDA
AFP MoM: 1.17
AFP Value: 35.6 ng/mL
Gest. Age on Collection Date: 17 wk
Maternal Age At EDD: 32.5 a
OSBR Risk 1 IN: 10000
Test Results:: NEGATIVE
Weight: 254 [lb_av]

## 2023-05-31 MED ORDER — FLUCONAZOLE 150 MG PO TABS: 150.0000 mg | ORAL_TABLET | Freq: Once | ORAL | 3 refills | Status: AC

## 2023-05-31 NOTE — Patient Instructions (Signed)
At centering we talked about constipation -- here are some instructions and tools to use for constipation   You have constipation which is hard stools that are difficult to pass. It is important to have regular bowel movements every 1-3 days that are soft and easy to pass. Hard stools increase your risk of hemorrhoids and are very uncomfortable.   To prevent constipation you can increase the amount of fiber in your diet. Examples of foods with fiber are leafy greens, whole grain breads, oatmeal and other grains.  It is also important to drink at least eight 8oz glass of water everyday.   If you have not has a bowel movement in 4-5 days you made need to clean out your bowel.  This will have establish normal movement through your bowel.    Miralax Clean out Take 8 capfuls of miralax in 64 oz of gatorade. You can use any fluid that appeals to you (gatorade, water, juice) Continue to drink at least eight 8 oz glasses of water throughout the day You can repeat with another 8 capfuls of miralax in 64 oz of gatorade if you are not having a large amount of stools You will need to be at home and close to a bathroom for about 8 hours when you do the above as you may need to go to the bathroom frequently.   After you are cleaned out: - Start Colace100mg  twice daily - Start Miralax once daily - Start a daily fiber supplement like metamucil or citrucel - You can safely use enemas in pregnancy  - if you are having diarrhea you can reduce to Colace once a day or miralax every other day or a 1/2 capful daily.

## 2023-05-31 NOTE — Addendum Note (Signed)
Addended by: Geanie Berlin on: 05/31/2023 09:15 AM   Modules accepted: Orders

## 2023-06-03 DIAGNOSIS — O99210 Obesity complicating pregnancy, unspecified trimester: Secondary | ICD-10-CM | POA: Insufficient documentation

## 2023-06-07 ENCOUNTER — Other Ambulatory Visit: Payer: Self-pay

## 2023-06-07 MED ORDER — TERCONAZOLE 0.4 % VA CREA: 1.0000 | TOPICAL_CREAM | Freq: Every day | VAGINAL | 0 refills | Status: DC

## 2023-06-12 ENCOUNTER — Telehealth: Payer: Self-pay | Admitting: Lactation Services

## 2023-06-12 ENCOUNTER — Other Ambulatory Visit: Payer: Self-pay | Admitting: *Deleted

## 2023-06-12 ENCOUNTER — Ambulatory Visit: Payer: Medicaid Other

## 2023-06-12 ENCOUNTER — Other Ambulatory Visit: Payer: Self-pay

## 2023-06-12 ENCOUNTER — Ambulatory Visit: Payer: Medicaid Other | Attending: Obstetrics and Gynecology

## 2023-06-12 VITALS — BP 110/67 | HR 79

## 2023-06-12 DIAGNOSIS — O99210 Obesity complicating pregnancy, unspecified trimester: Secondary | ICD-10-CM | POA: Insufficient documentation

## 2023-06-12 DIAGNOSIS — O09292 Supervision of pregnancy with other poor reproductive or obstetric history, second trimester: Secondary | ICD-10-CM | POA: Diagnosis not present

## 2023-06-12 DIAGNOSIS — O10912 Unspecified pre-existing hypertension complicating pregnancy, second trimester: Secondary | ICD-10-CM | POA: Insufficient documentation

## 2023-06-12 DIAGNOSIS — O321XX0 Maternal care for breech presentation, not applicable or unspecified: Secondary | ICD-10-CM | POA: Insufficient documentation

## 2023-06-12 DIAGNOSIS — Z362 Encounter for other antenatal screening follow-up: Secondary | ICD-10-CM

## 2023-06-12 DIAGNOSIS — Z3A19 19 weeks gestation of pregnancy: Secondary | ICD-10-CM | POA: Diagnosis not present

## 2023-06-12 DIAGNOSIS — O99212 Obesity complicating pregnancy, second trimester: Secondary | ICD-10-CM | POA: Diagnosis not present

## 2023-06-12 DIAGNOSIS — D649 Anemia, unspecified: Secondary | ICD-10-CM | POA: Diagnosis not present

## 2023-06-12 DIAGNOSIS — Z3491 Encounter for supervision of normal pregnancy, unspecified, first trimester: Secondary | ICD-10-CM | POA: Diagnosis not present

## 2023-06-12 DIAGNOSIS — Z363 Encounter for antenatal screening for malformations: Secondary | ICD-10-CM | POA: Diagnosis not present

## 2023-06-12 NOTE — Telephone Encounter (Signed)
Called Pharmacy to inform of approval for Terconazole. Spoke with Pharmacy staff, Medication was able to be run through.   Called patient to let her know it has been approved and pharmacy is getting ready.

## 2023-06-12 NOTE — Telephone Encounter (Signed)
PA for Terconazole   Chelsea Baldwin (Key: [id number]) PA Case ID #: [id number] Rx #: [id number] Need Help? Call us at [phone] Outcome Approved today by [facility] PA Case: [id number], Status: Approved, Coverage Starts on: 06/12/2023 12:00:00 AM, Coverage Ends on: 06/11/2024 12:00:00 AM. Authorization Expiration Date: 06/10/2024 Drug Terconazole 0.4% cream Form [employer] [facility] Electronic [place] Form [phone] NCPDP) Original Claim Info [id number]  Called Pharmacy and they are closed for lunch. Will try again at a later time.

## 2023-06-21 DIAGNOSIS — F411 Generalized anxiety disorder: Secondary | ICD-10-CM | POA: Diagnosis not present

## 2023-06-26 ENCOUNTER — Ambulatory Visit (INDEPENDENT_AMBULATORY_CARE_PROVIDER_SITE_OTHER): Payer: Medicaid Other | Admitting: Family Medicine

## 2023-06-26 VITALS — BP 120/76 | HR 99 | Wt 257.2 lb

## 2023-06-26 DIAGNOSIS — Z3A21 21 weeks gestation of pregnancy: Secondary | ICD-10-CM

## 2023-06-26 DIAGNOSIS — D508 Other iron deficiency anemias: Secondary | ICD-10-CM

## 2023-06-26 DIAGNOSIS — O0992 Supervision of high risk pregnancy, unspecified, second trimester: Secondary | ICD-10-CM

## 2023-06-26 DIAGNOSIS — R102 Pelvic and perineal pain: Secondary | ICD-10-CM

## 2023-06-26 DIAGNOSIS — O34219 Maternal care for unspecified type scar from previous cesarean delivery: Secondary | ICD-10-CM

## 2023-06-26 DIAGNOSIS — O26892 Other specified pregnancy related conditions, second trimester: Secondary | ICD-10-CM

## 2023-06-26 DIAGNOSIS — B009 Herpesviral infection, unspecified: Secondary | ICD-10-CM

## 2023-06-26 DIAGNOSIS — O99212 Obesity complicating pregnancy, second trimester: Secondary | ICD-10-CM

## 2023-06-26 DIAGNOSIS — O0990 Supervision of high risk pregnancy, unspecified, unspecified trimester: Secondary | ICD-10-CM

## 2023-07-01 ENCOUNTER — Encounter: Payer: Self-pay | Admitting: Family Medicine

## 2023-07-01 NOTE — Progress Notes (Signed)
   PRENATAL VISIT NOTE  Subjective:  Chelsea Baldwin is a 32 y.o. G4P3003 at [redacted]w[redacted]d being seen today for ongoing prenatal care.  She is currently monitored for the following issues for this high-risk pregnancy and has Anemia; Mild tetrahydrocannabinol (THC) abuse; Hemoglobin C trait (HCC); History of gestational hypertension; Supervision of high risk pregnancy, antepartum; HSV (herpes simplex virus) infection; Previous cesarean delivery affecting pregnancy, antepartum; and Obesity affecting pregnancy on their problem list.  Patient reports pelvic and back pain.  Contractions: Not present. Vag. Bleeding: None.  Movement: Present. Denies leaking of fluid.   The following portions of the patient's history were reviewed and updated as appropriate: allergies, current medications, past family history, past medical history, past social history, past surgical history and problem list.   Objective:   Vitals:   06/26/23 1148  BP: 120/76  Pulse: 99  Weight: 257 lb 3.2 oz (116.7 kg)    Fetal Status: Fetal Heart Rate (bpm): 145 Fundal Height: 22 cm Movement: Present     General:  Alert, oriented and cooperative. Patient is in no acute distress.  Skin: Skin is warm and dry. No rash noted.   Cardiovascular: Normal heart rate noted  Respiratory: Normal respiratory effort, no problems with respiration noted  Abdomen: Soft, gravid, appropriate for gestational age.  Pain/Pressure: Present     Pelvic: Cervical exam deferred        Extremities: Normal range of motion.     Mental Status: Normal mood and affect. Normal behavior. Normal judgment and thought content.   Assessment and Plan:  Pregnancy: G4P3003 at [redacted]w[redacted]d 1. Supervision of high risk pregnancy, antepartum   Centering Pregnancy, Session#3: Reviewed resources in [employer].   Facilitated discussion today:  PTL precautions, back pain, stretches, stress reductions, and breastfeeding/infant nutrition Mindfulness activity completed as  well as deep breathing with still touch for childbirth preparation.    Fundal height and FHR appropriate today unless noted otherwise in plan. Patient to continue group care.   2. Previous cesarean delivery affecting pregnancy, antepartum SVD--> CS-->VBAC Desires TOLAC Consent at 28wk  3. Obesity affecting pregnancy in second trimester, unspecified obesity type TWG=-4 lb 12.8 oz (-2.177 kg)   4. HSV (herpes simplex virus) infection PPX at 34-36 wks  5. Other iron deficiency anemia Lab Results  Component Value Date   HGB 11.4 04/18/2023   HGB 12.3 10/20/2021  Previously had severe anemia WNL so far this pregnancy  6. Pelvic pain affecting pregnancy in second trimester, antepartum Recommended belly band Referral to PT today   Preterm labor symptoms and general obstetric precautions including but not limited to vaginal bleeding, contractions, leaking of fluid and fetal movement were reviewed in detail with the patient. Please refer to After Visit Summary for other counseling recommendations.   Return in about 2 weeks (around 07/10/2023) for Centering Pregnancy.  Future Appointments  Date Time Provider Department Center  07/10/2023  9:00 AM CENTERING PROVIDER [facility]  07/17/2023  1:30 PM [facility] US1 [facility]  07/24/2023  9:00 AM CENTERING PROVIDER [facility]  08/07/2023  8:20 AM WMC-WOCA LAB [facility]  08/07/2023  9:00 AM CENTERING PROVIDER [facility]  08/28/2023  9:00 AM CENTERING PROVIDER [facility]  09/04/2023  9:00 AM CENTERING PROVIDER [facility]  09/18/2023  9:00 AM CENTERING PROVIDER [facility]  10/02/2023  9:00 AM CENTERING PROVIDER [facility]    Federico Flake, MD

## 2023-07-08 ENCOUNTER — Other Ambulatory Visit: Payer: Self-pay

## 2023-07-08 ENCOUNTER — Ambulatory Visit: Payer: Medicaid Other | Attending: Family Medicine | Admitting: Physical Therapy

## 2023-07-08 DIAGNOSIS — R279 Unspecified lack of coordination: Secondary | ICD-10-CM | POA: Insufficient documentation

## 2023-07-08 DIAGNOSIS — O26892 Other specified pregnancy related conditions, second trimester: Secondary | ICD-10-CM | POA: Diagnosis not present

## 2023-07-08 DIAGNOSIS — M6281 Muscle weakness (generalized): Secondary | ICD-10-CM | POA: Insufficient documentation

## 2023-07-08 DIAGNOSIS — R293 Abnormal posture: Secondary | ICD-10-CM | POA: Insufficient documentation

## 2023-07-08 DIAGNOSIS — M62838 Other muscle spasm: Secondary | ICD-10-CM | POA: Insufficient documentation

## 2023-07-08 DIAGNOSIS — Z3A00 Weeks of gestation of pregnancy not specified: Secondary | ICD-10-CM | POA: Diagnosis not present

## 2023-07-08 DIAGNOSIS — R102 Pelvic and perineal pain: Secondary | ICD-10-CM | POA: Insufficient documentation

## 2023-07-08 NOTE — Therapy (Signed)
OUTPATIENT PHYSICAL THERAPY FEMALE PELVIC EVALUATION   Patient Name: Chelsea Baldwin MRN: [id number] DOB:14-Feb-1991, 32 y.o., female Today's Date: 07/08/2023  END OF SESSION:  PT End of Session - 07/08/23 1150     Visit Number 1    Date for PT Re-Evaluation 11/05/23    Authorization Type HB    PT Start Time 1151   arrival time   PT Stop Time 1225    PT Time Calculation (min) 34 min    Activity Tolerance Patient tolerated treatment well    Behavior During Therapy [facility] for tasks assessed/performed             Past Medical History:  Diagnosis Date   Anemia of pregnancy 06/02/2018   Arm numbness left 08/25/2019   COVID-19 vaccine administered 02/08/2021   Encounter for administration of COVID-19 vaccine 02/07/2021   HSV (herpes simplex virus) infection 04/10/2023   Low back pain 08/25/2019   Previous cesarean delivery, antepartum 01/01/2020   Desires TOLAC, signed TOLAC consent 01/01/20     Screening for cervical cancer 10/12/2020   VBAC, delivered    Past Surgical History:  Procedure Laterality Date   CESAREAN SECTION  2017   G2    TONSILLECTOMY     Patient Active Problem List   Diagnosis Date Noted   Obesity affecting pregnancy 06/03/2023   Previous cesarean delivery affecting pregnancy, antepartum 05/01/2023   Supervision of high risk pregnancy, antepartum 04/10/2023   HSV (herpes simplex virus) infection 04/10/2023   History of gestational hypertension 01/15/2020   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   Mild tetrahydrocannabinol (THC) abuse 07/09/2019   Anemia 06/02/2018    PCP: Evette Georges, MD  REFERRING PROVIDER: Federico Flake, MD    REFERRING DIAG: [phone] (ICD-10-CM) - Pelvic pain affecting pregnancy in second trimester, antepartum  THERAPY DIAG:  Muscle weakness (generalized)  Abnormal posture  Other muscle spasm  Unspecified lack of coordination  Rationale for Evaluation and Treatment: Rehabilitation  ONSET DATE: [redacted] weeks  pregnant   SUBJECTIVE:                                                                                                                                                                                           SUBJECTIVE STATEMENT: Having a lot of pelvic pain, numbness on Lt leg that doesn't really go away. Worse with standing, sitting, prolonged time in any position or with any activity. Rolling in bed is worse. Sometimes pain makes her feel nauseous and faint sometimes. Started around 10-12 weeks with pregnancy and has gotten worse.  Has three children 12, 7, 3. Did have pelvic pain with previous pregnancies but  not this bad  PAIN:  Are you having pain? Yes NPRS scale: 5-10/10 Pain location:  pelvic pain and Lt hip  Pain type: aching and stabbing Pain description: constant   Aggravating factors: prolonged position or mobility Relieving factors: resting  PRECAUTIONS: Other: [redacted]wks pregnant   RED FLAGS: None   WEIGHT BEARING RESTRICTIONS: No  FALLS:  Has patient fallen in last 6 months? No  LIVING ENVIRONMENT: Lives with: lives with their family Lives in: House/apartment  OCCUPATION: CNA  PLOF: Independent  PATIENT GOALS: to have less pain  PERTINENT HISTORY:  [id number], 21w, high-risk pregnancy and has Anemia; Mild tetrahydrocannabinol (THC) abuse; Hemoglobin C trait (HCC); History of gestational hypertension; Supervision of high risk pregnancy, antepartum; HSV (herpes simplex virus) infection; Previous cesarean delivery affecting pregnancy, antepartum; and Obesity affecting pregnancy Sexual abuse: No  BOWEL MOVEMENT: Pain with bowel movement: No Type of bowel movement:Type (Bristol Stool Scale) 4-5, Frequency daily to every other day, and Strain No Fully empty rectum: Yes:   Leakage: No Pads: No Fiber supplement: No  URINATION: Pain with urination: No Fully empty bladder: Yes:   Stream: Strong Urgency: Yes: but with pregnancy Frequency: increased with pregnancy,  every 2 hours during the day and 4+x at night Leakage:  no Pads: No  INTERCOURSE: Pain with intercourse:  not active but reports positional pain with intercourse previously Ability to have vaginal penetration:  Yes: denies dryness Climax: not painful Marinoff Scale: 0/3  PREGNANCY: Vaginal deliveries 2 Tearing Yes: sitiches with both vaginal births C-section deliveries 1 Currently pregnant Yes: 22wk  PROLAPSE: None   OBJECTIVE:  Note: Objective measures were completed at Evaluation unless otherwise noted.  DIAGNOSTIC FINDINGS:   PATIENT SURVEYS:    PFIQ-7 summary 125 (UIQ-7 38) and (POPIQ-7 86)  COGNITION: Overall cognitive status: Within functional limits for tasks assessed     SENSATION: Light touch: Deficits reports numbness and tingling on Lt outer thigh stopping about 2-3 in above knee Proprioception: Appears intact  MUSCLE LENGTH: Bil hamstrings and adductors limited by 25%  LUMBAR SPECIAL TESTS:  Single leg stance test: pain worse with standing on Lt but immediately increased pain at pubic symphysis , SI Compression/distraction test: felt better with compression and FABER test: Negative  FUNCTIONAL TESTS:  Functional squat - unable to complete full descent and limited by 50% with pain and weakness, bil knee valgus demonstrated and need of hands at knees and use of external support to return to standing.   GAIT: Decreased cadence, decreased bil step height, decreased stride length  POSTURE: rounded shoulders, forward head, increased thoracic kyphosis, and anterior pelvic tilt  PELVIC ALIGNMENT: WFL  LUMBARAROM/PROM:  A/PROM A/PROM  eval  Flexion WFL  Extension WFL  Right lateral flexion Limited by 25%  Left lateral flexion Limited by 25%  Right rotation Limited by 25%  Left rotation Limited by 25% increased pain at Lt hip reported   (Blank rows = not tested)  LOWER EXTREMITY ROM:  WFL  LOWER EXTREMITY MMT:  LT hip grossly 3+/5 with pain  testing adduction only, Rt hip grossly 4/5; knees 5/5  PALPATION:   General  TTP at pubic symphysis, tightness at bil thoracic and lumbar paraspinals                External Perineal Exam deferred                             Internal Pelvic Floor deferred  Patient confirms  identification and approves PT to assess internal pelvic floor and treatment No  PELVIC MMT:   MMT eval  Vaginal   Internal Anal Sphincter   External Anal Sphincter   Puborectalis   Diastasis Recti   (Blank rows = not tested)        TONE: deferred  PROLAPSE: deferred  TODAY'S TREATMENT:                                                                                                                              DATE:   07/08/23 EVAL Examination completed, findings reviewed, pt educated on POC, HEP, and manual with taping at abdomen for improved support and decreased pain. Ktape used x5 pieces with 25% upward pull for decreased pain, pt reported immediate relief with standing. Pt also educated on log rolling for improved bed mobility and decreased strain at abdomen and pain at hips with crunch forward to come to sitting. Pt demonstrated good carry over with this during session. Pt educated on how/when to remove tape and to remove with any skin irritation or adverse reactions. Pt verbalized understanding. Pt motivated to participate in PT and agreeable to attempt recommendations.     If treatment provided at initial evaluation, no treatment charged due to lack of authorization.      PATIENT EDUCATION:  Education details: Access Code: [email or web address] Person educated: Patient Education method: Explanation, Demonstration, Tactile cues, Verbal cues, and Handouts Education comprehension: verbalized understanding, returned demonstration, verbal cues required, tactile cues required, and needs further education  HOME EXERCISE PROGRAM: Access Code: [email or web address]  ASSESSMENT:  CLINICAL IMPRESSION: Patient is a 32 y.o. female   who was seen today for physical therapy evaluation and treatment for pelvic pain with pregnancy. Pt reports she has had worsening pelvic floor since [redacted] weeks pregnant, now 22wks. Pt reports she is limited with all mobility and pain also worse with prolonged activity or position. Pt denies other concerns with bowels or bladder. Pt found to have decreased flexibility in spine and hips, decreased core and hip pain, limited with mobility with pregnancy growth. Deferred internal assessment today as pt is high risk pregnancy and denied urine/stool symptoms at this time. Pt responded well with taping abdomen today and hopeful this lasts. Pt would benefit from additional PT to further address deficits.    OBJECTIVE IMPAIRMENTS: decreased activity tolerance, decreased mobility, difficulty walking, decreased strength, increased fascial restrictions, increased muscle spasms, impaired flexibility, improper body mechanics, postural dysfunction, and pain.   ACTIVITY LIMITATIONS: carrying, lifting, bending, sitting, standing, squatting, transfers, bed mobility, and locomotion level  PARTICIPATION LIMITATIONS: community activity and occupation  PERSONAL FACTORS: Time since onset of injury/illness/exacerbation and 1 comorbidity: medical history  are also affecting patient's functional outcome.   REHAB POTENTIAL: Good  CLINICAL DECISION MAKING: Evolving/moderate complexity  EVALUATION COMPLEXITY: Moderate   GOALS: Goals reviewed with patient? Yes  [place] TERM GOALS: Target date: 08/05/23  Pt to be I with HEP.  Baseline: Goal status:  INITIAL  2.  Pt will report no more than 7/10 pain with 30 mins in one position or with one task for improved tolerance to activity with work. Baseline:  Goal status: INITIAL  3.  Pt to be I with breathing mechanics and relaxation technique to help decreased pain.  Baseline:  Goal status: INITIAL   LONG TERM GOALS: Target date: 11/05/23  Pt to be I with advanced HEP.   Baseline:  Goal status: INITIAL  2.  Pt will report no more than 4/10 pain with 30 mins in one position or with one task for improved tolerance to activity with work. Baseline:  Goal status: INITIAL  3.  Pt to demonstrate improved trunk mobility in all directions without pain for improved posture and decreased compensatory strategies with walking and standing.   Baseline:  Goal status: INITIAL  4.  Pt to demonstrate at least 5/5 bil hip strength for improved pelvic stability and functional squats with childcare.  Baseline:  Goal status: INITIAL  5.  Pt to be I with taping at abdomen if needed/helpful for pain management at home.  Baseline:  Goal status: INITIAL    PLAN:  PT FREQUENCY: 2x/week  PT DURATION:  16 sessions  PLANNED INTERVENTIONS: 97110-Therapeutic exercises, 97530- Therapeutic activity, 97112- Neuromuscular re-education, 97535- Self Care, 16109- Manual therapy, [phone]- Aquatic Therapy, Patient/Family education, Taping, Dry Needling, Scar mobilization, Cryotherapy, Moist heat, and Biofeedback  PLAN FOR NEXT SESSION: internal pelvic floor assessment if needed and pt consents, taping at abdomen, posture strengthening, stretching hips and core gently, hips and transverse abdominis strengthening   Otelia Sergeant, PT, DPT 11/18/243:33 PM

## 2023-07-10 ENCOUNTER — Ambulatory Visit: Payer: Medicaid Other | Admitting: Advanced Practice Midwife

## 2023-07-10 VITALS — BP 119/69 | HR 106 | Wt 260.6 lb

## 2023-07-10 DIAGNOSIS — Z3A23 23 weeks gestation of pregnancy: Secondary | ICD-10-CM | POA: Diagnosis not present

## 2023-07-10 DIAGNOSIS — O0990 Supervision of high risk pregnancy, unspecified, unspecified trimester: Secondary | ICD-10-CM | POA: Diagnosis not present

## 2023-07-10 DIAGNOSIS — O34219 Maternal care for unspecified type scar from previous cesarean delivery: Secondary | ICD-10-CM

## 2023-07-10 DIAGNOSIS — F411 Generalized anxiety disorder: Secondary | ICD-10-CM | POA: Diagnosis not present

## 2023-07-10 DIAGNOSIS — D508 Other iron deficiency anemias: Secondary | ICD-10-CM

## 2023-07-10 DIAGNOSIS — O99212 Obesity complicating pregnancy, second trimester: Secondary | ICD-10-CM

## 2023-07-10 DIAGNOSIS — B009 Herpesviral infection, unspecified: Secondary | ICD-10-CM
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
## 2023-07-16 ENCOUNTER — Ambulatory Visit: Payer: Medicaid Other | Admitting: Physical Therapy

## 2023-07-16 DIAGNOSIS — R293 Abnormal posture: Secondary | ICD-10-CM

## 2023-07-16 DIAGNOSIS — M6281 Muscle weakness (generalized): Secondary | ICD-10-CM | POA: Diagnosis not present

## 2023-07-16 DIAGNOSIS — R279 Unspecified lack of coordination: Secondary | ICD-10-CM | POA: Diagnosis not present

## 2023-07-16 DIAGNOSIS — O26892 Other specified pregnancy related conditions, second trimester: Secondary | ICD-10-CM | POA: Diagnosis not present

## 2023-07-16 DIAGNOSIS — M62838 Other muscle spasm: Secondary | ICD-10-CM

## 2023-07-16 DIAGNOSIS — R102 Pelvic and perineal pain: Secondary | ICD-10-CM | POA: Diagnosis not present

## 2023-07-16 DIAGNOSIS — Z3A00 Weeks of gestation of pregnancy not specified: Secondary | ICD-10-CM | POA: Diagnosis not present

## 2023-07-16 NOTE — Therapy (Signed)
OUTPATIENT PHYSICAL THERAPY FEMALE PELVIC Treatment   Patient Name: Chelsea Baldwin MRN: [id number] DOB:November 04, 1990, 32 y.o., female Today's Date: 07/16/2023  END OF SESSION:  PT End of Session - 07/16/23 1114     Visit Number 2    Date for PT Re-Evaluation 11/05/23    Authorization Type HB    Authorization Time Period [employer] approved 8 visits 07/08/23-09/05/23 auth#[id number]    Authorization - Visit Number 1    Authorization - Number of Visits 8    PT Start Time 1105   arrival   PT Stop Time 1143    PT Time Calculation (min) 38 min    Activity Tolerance Patient tolerated treatment well    Behavior During Therapy WFL for tasks assessed/performed             Past Medical History:  Diagnosis Date   Anemia of pregnancy 06/02/2018   Arm numbness left 08/25/2019   COVID-19 vaccine administered 02/08/2021   Encounter for administration of COVID-19 vaccine 02/07/2021   HSV (herpes simplex virus) infection 04/10/2023   Low back pain 08/25/2019   Previous cesarean delivery, antepartum 01/01/2020   Desires TOLAC, signed TOLAC consent 01/01/20     Screening for cervical cancer 10/12/2020   VBAC, delivered    Past Surgical History:  Procedure Laterality Date   CESAREAN SECTION  2017   G2    TONSILLECTOMY     Patient Active Problem List   Diagnosis Date Noted   Obesity affecting pregnancy 06/03/2023   Previous cesarean delivery affecting pregnancy, antepartum 05/01/2023   Supervision of high risk pregnancy, antepartum 04/10/2023   HSV (herpes simplex virus) infection 04/10/2023   History of gestational hypertension 01/15/2020   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   Mild tetrahydrocannabinol (THC) abuse 07/09/2019   Anemia 06/02/2018    PCP: Evette Georges, MD  REFERRING PROVIDER: Federico Flake, MD    REFERRING DIAG: [phone] (ICD-10-CM) - Pelvic pain affecting pregnancy in second trimester, antepartum  THERAPY DIAG:  Muscle weakness  (generalized)  Other muscle spasm  Abnormal posture  Rationale for Evaluation and Treatment: Rehabilitation  ONSET DATE: [redacted] weeks pregnant   SUBJECTIVE:                                                                                                                                                                                           SUBJECTIVE STATEMENT: Pt reports tape was really helpful and had decreased pain and able to tolerate more at home without pain increasing.   PAIN:  Are you having pain? Yes NPRS scale:6/10 Pain location:  pelvic pain and Lt hip  Pain type: aching and  stabbing Pain description: constant   Aggravating factors: prolonged position or mobility Relieving factors: resting  PRECAUTIONS: Other: [redacted]wks pregnant   RED FLAGS: None   WEIGHT BEARING RESTRICTIONS: No  FALLS:  Has patient fallen in last 6 months? No  LIVING ENVIRONMENT: Lives with: lives with their family Lives in: House/apartment  OCCUPATION: CNA  PLOF: Independent  PATIENT GOALS: to have less pain  PERTINENT HISTORY:  [id number], 21w, high-risk pregnancy and has Anemia; Mild tetrahydrocannabinol (THC) abuse; Hemoglobin C trait (HCC); History of gestational hypertension; Supervision of high risk pregnancy, antepartum; HSV (herpes simplex virus) infection; Previous cesarean delivery affecting pregnancy, antepartum; and Obesity affecting pregnancy Sexual abuse: No  BOWEL MOVEMENT: Pain with bowel movement: No Type of bowel movement:Type (Bristol Stool Scale) 4-5, Frequency daily to every other day, and Strain No Fully empty rectum: Yes:   Leakage: No Pads: No Fiber supplement: No  URINATION: Pain with urination: No Fully empty bladder: Yes:   Stream: Strong Urgency: Yes: but with pregnancy Frequency: increased with pregnancy, every 2 hours during the day and 4+x at night Leakage:  no Pads: No  INTERCOURSE: Pain with intercourse:  not active but reports positional pain  with intercourse previously Ability to have vaginal penetration:  Yes: denies dryness Climax: not painful Marinoff Scale: 0/3  PREGNANCY: Vaginal deliveries 2 Tearing Yes: sitiches with both vaginal births C-section deliveries 1 Currently pregnant Yes: 22wk  PROLAPSE: None   OBJECTIVE:  Note: Objective measures were completed at Evaluation unless otherwise noted.  DIAGNOSTIC FINDINGS:   PATIENT SURVEYS:    PFIQ-7 summary 125 (UIQ-7 38) and (POPIQ-7 86)  COGNITION: Overall cognitive status: Within functional limits for tasks assessed     SENSATION: Light touch: Deficits reports numbness and tingling on Lt outer thigh stopping about 2-3 in above knee Proprioception: Appears intact  MUSCLE LENGTH: Bil hamstrings and adductors limited by 25%  LUMBAR SPECIAL TESTS:  Single leg stance test: pain worse with standing on Lt but immediately increased pain at pubic symphysis , SI Compression/distraction test: felt better with compression and FABER test: Negative  FUNCTIONAL TESTS:  Functional squat - unable to complete full descent and limited by 50% with pain and weakness, bil knee valgus demonstrated and need of hands at knees and use of external support to return to standing.   GAIT: Decreased cadence, decreased bil step height, decreased stride length  POSTURE: rounded shoulders, forward head, increased thoracic kyphosis, and anterior pelvic tilt  PELVIC ALIGNMENT: WFL  LUMBARAROM/PROM:  A/PROM A/PROM  eval  Flexion WFL  Extension WFL  Right lateral flexion Limited by 25%  Left lateral flexion Limited by 25%  Right rotation Limited by 25%  Left rotation Limited by 25% increased pain at Lt hip reported   (Blank rows = not tested)  LOWER EXTREMITY ROM:  WFL  LOWER EXTREMITY MMT:  LT hip grossly 3+/5 with pain testing adduction only, Rt hip grossly 4/5; knees 5/5  PALPATION:   General  TTP at pubic symphysis, tightness at bil thoracic and lumbar  paraspinals                External Perineal Exam deferred                             Internal Pelvic Floor deferred  Patient confirms identification and approves PT to assess internal pelvic floor and treatment No  PELVIC MMT:   MMT eval  Vaginal   Internal Anal Sphincter  External Anal Sphincter   Puborectalis   Diastasis Recti   (Blank rows = not tested)        TONE: deferred  PROLAPSE: deferred  TODAY'S TREATMENT:                                                                                                                              DATE:   07/08/23 EVAL Examination completed, findings reviewed, pt educated on POC, HEP, and manual with taping at abdomen for improved support and decreased pain. Ktape used x5 pieces with 25% upward pull for decreased pain, pt reported immediate relief with standing. Pt also educated on log rolling for improved bed mobility and decreased strain at abdomen and pain at hips with crunch forward to come to sitting. Pt demonstrated good carry over with this during session. Pt educated on how/when to remove tape and to remove with any skin irritation or adverse reactions. Pt verbalized understanding. Pt motivated to participate in PT and agreeable to attempt recommendations.    07/16/23:  manual with taping at abdomen for improved support and decreased pain. Ktape used x5 pieces with 25% upward pull for decreased pain. Adductor shifts in sitting x10 neutral, toes in and toes out Pelvic tilts 2x10 in hooklying  Windshield wipers x10 each Hip ER stretch 3x30s each in hooklying  Thoracic opener x10 each Reverse clams 2x10 each Elevated lunge 3x30s each X10 Sit to stand with transverse abdominis activation   PATIENT EDUCATION:  Education details: Access Code: [email or web address] Person educated: Patient Education method: Programmer, multimedia, Demonstration, Tactile cues, Verbal cues, and Handouts Education comprehension: verbalized understanding, returned  demonstration, verbal cues required, tactile cues required, and needs further education  HOME EXERCISE PROGRAM: Access Code: [email or web address]  ASSESSMENT:  CLINICAL IMPRESSION: Patient presents for treatment with improvement of pain levels, reports she has been able to do more at home without pain increasing and having lower pain levels overall. Tape very helpful. Pt session focused on gentle stretching for low back and hips and initiating gentle core strengthening for improved pelvic stability. Pt reported 1-2/10 pain at end of session and really pleased with progress so far. Pt responded well with taping abdomen today and hopeful this lasts. Pt would benefit from additional PT to further address deficits.    OBJECTIVE IMPAIRMENTS: decreased activity tolerance, decreased mobility, difficulty walking, decreased strength, increased fascial restrictions, increased muscle spasms, impaired flexibility, improper body mechanics, postural dysfunction, and pain.   ACTIVITY LIMITATIONS: carrying, lifting, bending, sitting, standing, squatting, transfers, bed mobility, and locomotion level  PARTICIPATION LIMITATIONS: community activity and occupation  PERSONAL FACTORS: Time since onset of injury/illness/exacerbation and 1 comorbidity: medical history  are also affecting patient's functional outcome.   REHAB POTENTIAL: Good  CLINICAL DECISION MAKING: Evolving/moderate complexity  EVALUATION COMPLEXITY: Moderate   GOALS: Goals reviewed with patient? Yes  [place] TERM GOALS: Target date: 08/05/23  Pt to be I with HEP.  Baseline: Goal status: INITIAL  2.  Pt will  report no more than 7/10 pain with 30 mins in one position or with one task for improved tolerance to activity with work. Baseline:  Goal status: INITIAL  3.  Pt to be I with breathing mechanics and relaxation technique to help decreased pain.  Baseline:  Goal status: INITIAL   LONG TERM GOALS: Target date: 11/05/23  Pt to be I with  advanced HEP.  Baseline:  Goal status: INITIAL  2.  Pt will report no more than 4/10 pain with 30 mins in one position or with one task for improved tolerance to activity with work. Baseline:  Goal status: INITIAL  3.  Pt to demonstrate improved trunk mobility in all directions without pain for improved posture and decreased compensatory strategies with walking and standing.   Baseline:  Goal status: INITIAL  4.  Pt to demonstrate at least 5/5 bil hip strength for improved pelvic stability and functional squats with childcare.  Baseline:  Goal status: INITIAL  5.  Pt to be I with taping at abdomen if needed/helpful for pain management at home.  Baseline:  Goal status: INITIAL    PLAN:  PT FREQUENCY: 2x/week  PT DURATION:  16 sessions  PLANNED INTERVENTIONS: 97110-Therapeutic exercises, 97530- Therapeutic activity, 97112- Neuromuscular re-education, 97535- Self Care, 09811- Manual therapy, [phone]- Aquatic Therapy, Patient/Family education, Taping, Dry Needling, Scar mobilization, Cryotherapy, Moist heat, and Biofeedback  PLAN FOR NEXT SESSION: internal pelvic floor assessment if needed and pt consents, taping at abdomen, posture strengthening, stretching hips and core gently, hips and transverse abdominis strengthening   Otelia Sergeant, PT, DPT 07/16/2411:08 PM

## 2023-07-17 ENCOUNTER — Ambulatory Visit: Payer: Medicaid Other | Attending: Maternal & Fetal Medicine

## 2023-07-17 ENCOUNTER — Other Ambulatory Visit: Payer: Self-pay | Admitting: *Deleted

## 2023-07-17 DIAGNOSIS — Z148 Genetic carrier of other disease: Secondary | ICD-10-CM | POA: Diagnosis not present

## 2023-07-17 DIAGNOSIS — O99212 Obesity complicating pregnancy, second trimester: Secondary | ICD-10-CM | POA: Insufficient documentation

## 2023-07-17 DIAGNOSIS — O09292 Supervision of pregnancy with other poor reproductive or obstetric history, second trimester: Secondary | ICD-10-CM | POA: Diagnosis not present

## 2023-07-17 DIAGNOSIS — O99322 Drug use complicating pregnancy, second trimester: Secondary | ICD-10-CM

## 2023-07-17 DIAGNOSIS — Z3A24 24 weeks gestation of pregnancy: Secondary | ICD-10-CM

## 2023-07-17 DIAGNOSIS — E669 Obesity, unspecified: Secondary | ICD-10-CM

## 2023-07-17 DIAGNOSIS — O34219 Maternal care for unspecified type scar from previous cesarean delivery: Secondary | ICD-10-CM | POA: Diagnosis not present

## 2023-07-17 DIAGNOSIS — F1290 Cannabis use, unspecified, uncomplicated: Secondary | ICD-10-CM

## 2023-07-17 DIAGNOSIS — O285 Abnormal chromosomal and genetic finding on antenatal screening of mother: Secondary | ICD-10-CM | POA: Diagnosis not present

## 2023-07-17 DIAGNOSIS — Z362 Encounter for other antenatal screening follow-up: Secondary | ICD-10-CM | POA: Diagnosis not present

## 2023-07-21 DIAGNOSIS — F411 Generalized anxiety disorder: Secondary | ICD-10-CM | POA: Diagnosis not present

## 2023-07-23 ENCOUNTER — Encounter: Payer: Self-pay | Admitting: Family Medicine

## 2023-07-24 ENCOUNTER — Ambulatory Visit: Payer: Medicaid Other | Admitting: Family Medicine

## 2023-07-24 VITALS — BP 116/76 | HR 84 | Wt 262.6 lb

## 2023-07-24 DIAGNOSIS — O99213 Obesity complicating pregnancy, third trimester: Secondary | ICD-10-CM

## 2023-07-24 DIAGNOSIS — O2642 Herpes gestationis, second trimester: Secondary | ICD-10-CM

## 2023-07-24 DIAGNOSIS — Z8759 Personal history of other complications of pregnancy, childbirth and the puerperium: Secondary | ICD-10-CM

## 2023-07-24 DIAGNOSIS — O99212 Obesity complicating pregnancy, second trimester: Secondary | ICD-10-CM

## 2023-07-24 DIAGNOSIS — O99012 Anemia complicating pregnancy, second trimester: Secondary | ICD-10-CM

## 2023-07-24 DIAGNOSIS — O0990 Supervision of high risk pregnancy, unspecified, unspecified trimester: Secondary | ICD-10-CM

## 2023-07-24 DIAGNOSIS — B009 Herpesviral infection, unspecified: Secondary | ICD-10-CM

## 2023-07-24 DIAGNOSIS — R0981 Nasal congestion: Secondary | ICD-10-CM

## 2023-07-24 DIAGNOSIS — Z3A25 25 weeks gestation of pregnancy: Secondary | ICD-10-CM

## 2023-07-24 DIAGNOSIS — O09292 Supervision of pregnancy with other poor reproductive or obstetric history, second trimester: Secondary | ICD-10-CM

## 2023-07-24 DIAGNOSIS — D508 Other iron deficiency anemias: Secondary | ICD-10-CM

## 2023-07-24 DIAGNOSIS — O34219 Maternal care for unspecified type scar from previous cesarean delivery: Secondary | ICD-10-CM

## 2023-07-24 DIAGNOSIS — E669 Obesity, unspecified: Secondary | ICD-10-CM

## 2023-07-24 MED ORDER — FLUTICASONE PROPIONATE 50 MCG/ACT NA SUSP
1.0000 | Freq: Every day | NASAL | 1 refills | Status: AC
Start: 2023-07-24 — End: ?

## 2023-07-24 NOTE — Progress Notes (Signed)
PRENATAL VISIT NOTE: Centering Pregnancy Group 15, Session 5   Subjective:  Chelsea Baldwin is a 32 y.o. G4P3003 at [redacted]w[redacted]d being seen today for ongoing prenatal care.  She is currently monitored for the following issues for this high-risk pregnancy and has Anemia; Mild tetrahydrocannabinol (THC) abuse; Hemoglobin C trait (HCC); History of gestational hypertension; Supervision of high risk pregnancy, antepartum; HSV (herpes simplex virus) infection; Previous cesarean delivery affecting pregnancy, antepartum; and Obesity affecting pregnancy on their problem list.  Patient reports no complaints and having some nasal congestion, chronic for several weeks, no fevers .  Contractions: Not present. Vag. Bleeding: None.  Movement: Present. Denies leaking of fluid.   The following portions of the patient's history were reviewed and updated as appropriate: allergies, current medications, past family history, past medical history, past social history, past surgical history and problem list.   Objective:   Vitals:   07/24/23 1024  BP: 116/76  Pulse: 84  Weight: 262 lb 9.6 oz (119.1 kg)    Fetal Status: Fetal Heart Rate (bpm): 145 Fundal Height: 27 cm Movement: Present     General:  Alert, oriented and cooperative. Patient is in no acute distress.  Skin: Skin is warm and dry. No rash noted.   Cardiovascular: Normal heart rate noted  Respiratory: Normal respiratory effort, no problems with respiration noted  Abdomen: Soft, gravid, appropriate for gestational age.  Pain/Pressure: Absent     Pelvic: Cervical exam deferred        Extremities: Normal range of motion.     Mental Status: Normal mood and affect. Normal behavior. Normal judgment and thought content.   Assessment and Plan:  Pregnancy: G4P3003 at [redacted]w[redacted]d 1. Previous cesarean delivery affecting pregnancy, antepartum Desires TOLAC Sign consent next visit  2. History of gestational hypertension BP WNL today  3. Obesity affecting  pregnancy in second trimester, unspecified obesity type TWG=9.6 oz (0.272 kg)   4. Supervision of high risk pregnancy, antepartum  Centering Pregnancy, Session#5: Reviewed resources in [employer].   Facilitated discussion today:  Stages of Labor, Sign of labor, labor positions, coping strategies.   Fundal height and FHR appropriate today unless noted otherwise in plan. Patient to continue group care.   5. HSV (herpes simplex virus) infection PPX at 34-36  6. Other iron deficiency anemia Lab Results  Component Value Date   HGB 11.4 04/18/2023   HGB 12.3 10/20/2021   HGB 12.5 10/10/2020  Recheck at 28 weeks  7. Nasal congestion - Recommend nasal saline rinses - Trial of flonase  Preterm labor symptoms and general obstetric precautions including but not limited to vaginal bleeding, contractions, leaking of fluid and fetal movement were reviewed in detail with the patient. Please refer to After Visit Summary for other counseling recommendations.   Return in about 2 weeks (around 08/07/2023) for Routine prenatal care, Centering.  Future Appointments  Date Time Provider Department Center  07/31/2023 11:00 AM Barbaraann Faster, PT [facility] None  08/05/2023  8:45 AM Edrick Oh, PT [facility] None  08/07/2023  8:20 AM [facility]  08/07/2023  9:00 AM CENTERING PROVIDER [facility]  08/12/2023  8:45 AM Edrick Oh, PT [facility] None  08/19/2023  8:45 AM Edrick Oh, PT [facility] None  08/20/2023  9:30 AM [facility] US4 [facility]  08/26/2023  9:30 AM Edrick Oh, PT [facility] None  08/28/2023  2:00 PM CENTERING PROVIDER [facility]  09/02/2023  8:45 AM Edrick Oh, PT [facility] None  09/04/2023  9:00 AM CENTERING PROVIDER [facility]  09/09/2023  8:45 AM Edrick Oh, PT [facility] None  09/16/2023  8:45 AM Edrick Oh, PT [facility] None  09/18/2023  9:00 AM CENTERING PROVIDER [facility]  09/25/2023 11:00 AM Barbaraann Faster, PT [facility] None   10/02/2023  9:00 AM CENTERING PROVIDER [facility]  10/03/2023 10:15 AM Barbaraann Faster, PT [facility] None    Federico Flake, MD

## 2023-07-25 ENCOUNTER — Ambulatory Visit: Payer: Medicaid Other | Attending: Family Medicine

## 2023-07-25 DIAGNOSIS — R293 Abnormal posture: Secondary | ICD-10-CM | POA: Diagnosis not present

## 2023-07-25 DIAGNOSIS — M62838 Other muscle spasm: Secondary | ICD-10-CM | POA: Diagnosis not present

## 2023-07-25 DIAGNOSIS — M6281 Muscle weakness (generalized): Secondary | ICD-10-CM | POA: Diagnosis not present

## 2023-07-25 NOTE — Therapy (Signed)
OUTPATIENT PHYSICAL THERAPY FEMALE PELVIC Treatment   Patient Name: Chelsea Baldwin MRN: [id number] DOB:1990/10/10, 32 y.o., female Today's Date: 07/25/2023  END OF SESSION:  PT End of Session - 07/25/23 0928     Visit Number 3    Date for PT Re-Evaluation 11/05/23    Authorization Type HB    Authorization Time Period [employer] approved 8 visits 07/08/23-09/05/23 auth#[id number]    Authorization - Visit Number 2    Authorization - Number of Visits 8    PT Start Time 0848    PT Stop Time 0926    PT Time Calculation (min) 38 min    Activity Tolerance Patient tolerated treatment well    Behavior During Therapy [facility] for tasks assessed/performed              Past Medical History:  Diagnosis Date   Anemia of pregnancy 06/02/2018   Arm numbness left 08/25/2019   COVID-19 vaccine administered 02/08/2021   Encounter for administration of COVID-19 vaccine 02/07/2021   HSV (herpes simplex virus) infection 04/10/2023   Low back pain 08/25/2019   Previous cesarean delivery, antepartum 01/01/2020   Desires TOLAC, signed TOLAC consent 01/01/20     Screening for cervical cancer 10/12/2020   VBAC, delivered    Past Surgical History:  Procedure Laterality Date   CESAREAN SECTION  2017   G2    TONSILLECTOMY     Patient Active Problem List   Diagnosis Date Noted   Obesity affecting pregnancy 06/03/2023   Previous cesarean delivery affecting pregnancy, antepartum 05/01/2023   Supervision of high risk pregnancy, antepartum 04/10/2023   HSV (herpes simplex virus) infection 04/10/2023   History of gestational hypertension 01/15/2020   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   Mild tetrahydrocannabinol (THC) abuse 07/09/2019   Anemia 06/02/2018    PCP: Evette Georges, MD  REFERRING PROVIDER: Federico Flake, MD    REFERRING DIAG: [id number] (ICD-10-CM) - Pelvic pain affecting pregnancy in second trimester, antepartum  THERAPY DIAG:  Muscle weakness (generalized)  Other  muscle spasm  Abnormal posture  Rationale for Evaluation and Treatment: Rehabilitation  ONSET DATE: [redacted] weeks pregnant   SUBJECTIVE:                                                                                                                                                                                           SUBJECTIVE STATEMENT: No pain today.  I have up to 7/10 pain with long periods of standing or walking long distances or climbing steps.    PAIN:  Are you having pain? Yes NPRS scale:0-7/10 Pain location:  pelvic pain and Lt hip  Pain type:  aching and stabbing Pain description: constant   Aggravating factors: prolonged position or mobility Relieving factors: resting, stretching   PRECAUTIONS: Other: [redacted]wks pregnant   RED FLAGS: None   WEIGHT BEARING RESTRICTIONS: No  FALLS:  Has patient fallen in last 6 months? No  LIVING ENVIRONMENT: Lives with: lives with their family Lives in: House/apartment  OCCUPATION: CNA  PLOF: Independent  PATIENT GOALS: to have less pain  PERTINENT HISTORY:  [id number], 21w, high-risk pregnancy and has Anemia; Mild tetrahydrocannabinol (THC) abuse; Hemoglobin C trait (HCC); History of gestational hypertension; Supervision of high risk pregnancy, antepartum; HSV (herpes simplex virus) infection; Previous cesarean delivery affecting pregnancy, antepartum; and Obesity affecting pregnancy Sexual abuse: No  BOWEL MOVEMENT: Pain with bowel movement: No Type of bowel movement:Type (Bristol Stool Scale) 4-5, Frequency daily to every other day, and Strain No Fully empty rectum: Yes:   Leakage: No Pads: No Fiber supplement: No  URINATION: Pain with urination: No Fully empty bladder: Yes:   Stream: Strong Urgency: Yes: but with pregnancy Frequency: increased with pregnancy, every 2 hours during the day and 4+x at night Leakage:  no Pads: No  INTERCOURSE: Pain with intercourse:  not active but reports positional pain with  intercourse previously Ability to have vaginal penetration:  Yes: denies dryness Climax: not painful Marinoff Scale: 0/3  PREGNANCY: Vaginal deliveries 2 Tearing Yes: sitiches with both vaginal births C-section deliveries 1 Currently pregnant Yes: 22wk  PROLAPSE: None   OBJECTIVE:  Note: Objective measures were completed at Evaluation unless otherwise noted.  DIAGNOSTIC FINDINGS:   PATIENT SURVEYS:    PFIQ-7 summary 125 (UIQ-7 38) and (POPIQ-7 86)  COGNITION: Overall cognitive status: Within functional limits for tasks assessed     SENSATION: Light touch: Deficits reports numbness and tingling on Lt outer thigh stopping about 2-3 in above knee Proprioception: Appears intact  MUSCLE LENGTH: Bil hamstrings and adductors limited by 25%  LUMBAR SPECIAL TESTS:  Single leg stance test: pain worse with standing on Lt but immediately increased pain at pubic symphysis , SI Compression/distraction test: felt better with compression and FABER test: Negative  FUNCTIONAL TESTS:  Functional squat - unable to complete full descent and limited by 50% with pain and weakness, bil knee valgus demonstrated and need of hands at knees and use of external support to return to standing.   GAIT: Decreased cadence, decreased bil step height, decreased stride length  POSTURE: rounded shoulders, forward head, increased thoracic kyphosis, and anterior pelvic tilt  PELVIC ALIGNMENT: WFL  LUMBARAROM/PROM:  A/PROM A/PROM  eval  Flexion WFL  Extension WFL  Right lateral flexion Limited by 25%  Left lateral flexion Limited by 25%  Right rotation Limited by 25%  Left rotation Limited by 25% increased pain at Lt hip reported   (Blank rows = not tested)  LOWER EXTREMITY ROM:  WFL  LOWER EXTREMITY MMT:  LT hip grossly 3+/5 with pain testing adduction only, Rt hip grossly 4/5; knees 5/5  PALPATION:   General  TTP at pubic symphysis, tightness at bil thoracic and lumbar paraspinals                 External Perineal Exam deferred                             Internal Pelvic Floor deferred  Patient confirms identification and approves PT to assess internal pelvic floor and treatment No  PELVIC MMT:   MMT eval  Vaginal  Internal Anal Sphincter   External Anal Sphincter   Puborectalis   Diastasis Recti   (Blank rows = not tested)        TONE: deferred  PROLAPSE: deferred  TODAY'S TREATMENT:                                                                                                                              DATE:   07/25/23: Seated hamstring stretch 2x20 seconds  NuStep: Level 2 x 5 minutes-PT present to discuss progress  Pelvic tilts 2x10 in hooklying  Windshield wipers x10 each Hip ER stretch 3x30s each in hooklying  Thoracic opener x10 each-leg resting on foam roll  TA activation with ball squeeze 5" x10 Reverse clams 2x10 each 2x10 Sit to stand with transverse abdominis activation manual with taping at abdomen for improved support and decreased pain. Ktape used x5 pieces with 25% upward pull for decreased pain.  07/08/23 EVAL Examination completed, findings reviewed, pt educated on POC, HEP, and manual with taping at abdomen for improved support and decreased pain. Ktape used x5 pieces with 25% upward pull for decreased pain, pt reported immediate relief with standing. Pt also educated on log rolling for improved bed mobility and decreased strain at abdomen and pain at hips with crunch forward to come to sitting. Pt demonstrated good carry over with this during session. Pt educated on how/when to remove tape and to remove with any skin irritation or adverse reactions. Pt verbalized understanding. Pt motivated to participate in PT and agreeable to attempt recommendations.    07/16/23:  manual with taping at abdomen for improved support and decreased pain. Ktape used x5 pieces with 25% upward pull for decreased pain. Adductor shifts in sitting x10  neutral, toes in and toes out Pelvic tilts 2x10 in hooklying  Windshield wipers x10 each Hip ER stretch 3x30s each in hooklying  Thoracic opener x10 each Reverse clams 2x10 each Elevated lunge 3x30s each X10 Sit to stand with transverse abdominis activation   PATIENT EDUCATION:  Education details: Access Code: [email or web address] Person educated: Patient Education method: Explanation, Demonstration, Tactile cues, Verbal cues, and Handouts Education comprehension: verbalized understanding, returned demonstration, verbal cues required, tactile cues required, and needs further education  HOME EXERCISE PROGRAM: Access Code: [email or web address] URL: [email or web address] Date: 07/25/2023 Prepared by: Tresa Endo  Exercises - Seated Diaphragmatic Breathing  - 1 x daily - 7 x weekly - 1 sets - 10 reps - Supine Lower Trunk Rotation  - 1 x daily - 7 x weekly - 1 sets - 10 reps - Quadruped Cat Cow  - 1 x daily - 7 x weekly - 1 sets - 10 reps - 5s holds - Unilateral Supported Supine Butterfly Stretch  - 1 x daily - 7 x weekly - 1 sets - 2 reps - 30s holds - Sidelying Open Book Thoracic Rotation with Knee on Foam Roll  - 1 x daily - 7 x weekly - 1 sets - 2  reps - 30s holds - Sidelying Reverse Clamshell  - 1 x daily - 7 x weekly - 1-2 sets - 10 reps - Clamshell  - 2-3 x daily - 7 x weekly - 1-2 sets - 10 reps - Seated Hamstring Stretch  - 2-3 x daily - 7 x weekly - 1 sets - 3 reps - 20 hold  ASSESSMENT:  CLINICAL IMPRESSION: Pt reports 40% reduction in pain since the start of care.  Pt is using change of position, stretching and breathing techniques to manage pain.  PT session focused on gentle stretching for low back and hips and gentle core strengthening for improved pelvic stability. PT added to HEP today and taped abdomen for support. PT monitored throughout session for pain and technique.  Pt would benefit from additional PT to further address deficits.    OBJECTIVE IMPAIRMENTS: decreased activity  tolerance, decreased mobility, difficulty walking, decreased strength, increased fascial restrictions, increased muscle spasms, impaired flexibility, improper body mechanics, postural dysfunction, and pain.   ACTIVITY LIMITATIONS: carrying, lifting, bending, sitting, standing, squatting, transfers, bed mobility, and locomotion level  PARTICIPATION LIMITATIONS: community activity and occupation  PERSONAL FACTORS: Time since onset of injury/illness/exacerbation and 1 comorbidity: medical history  are also affecting patient's functional outcome.   REHAB POTENTIAL: Good  CLINICAL DECISION MAKING: Evolving/moderate complexity  EVALUATION COMPLEXITY: Moderate   GOALS: Goals reviewed with patient? Yes  Fiorini TERM GOALS: Target date: 08/05/23  Pt to be I with HEP.  Baseline: Goal status: MET  2.  Pt will report no more than 7/10 pain with 30 mins in one position or with one task for improved tolerance to activity with work. Baseline: up to 7/10 now (07/25/23) Goal status: INITIAL  3.  Pt to be I with breathing mechanics and relaxation technique to help decreased pain.  Baseline: stretching and breathing to control pain (07/25/23) Goal status: MET   LONG TERM GOALS: Target date: 11/05/23  Pt to be I with advanced HEP.  Baseline:  Goal status: INITIAL  2.  Pt will report no more than 4/10 pain with 30 mins in one position or with one task for improved tolerance to activity with work. Baseline:  Goal status: INITIAL  3.  Pt to demonstrate improved trunk mobility in all directions without pain for improved posture and decreased compensatory strategies with walking and standing.   Baseline:  Goal status: INITIAL  4.  Pt to demonstrate at least 5/5 bil hip strength for improved pelvic stability and functional squats with childcare.  Baseline:  Goal status: INITIAL  5.  Pt to be I with taping at abdomen if needed/helpful for pain management at home.  Baseline:  Goal status:  INITIAL    PLAN:  PT FREQUENCY: 2x/week  PT DURATION:  16 sessions  PLANNED INTERVENTIONS: 97110-Therapeutic exercises, 97530- Therapeutic activity, 97112- Neuromuscular re-education, 97535- Self Care, 16109- Manual therapy, [phone]- Aquatic Therapy, Patient/Family education, Taping, Dry Needling, Scar mobilization, Cryotherapy, Moist heat, and Biofeedback  PLAN FOR NEXT SESSION: internal pelvic floor assessment if needed and pt consents, taping at abdomen, posture strengthening, stretching hips and core gently, hips and transverse abdominis strengthening   Lorrene Reid, PT 07/25/23 9:29 AM

## 2023-07-28 DIAGNOSIS — F411 Generalized anxiety disorder: Secondary | ICD-10-CM | POA: Diagnosis not present

## 2023-07-29 ENCOUNTER — Ambulatory Visit: Payer: Medicaid Other

## 2023-07-31 ENCOUNTER — Ambulatory Visit: Payer: Medicaid Other | Admitting: Physical Therapy

## 2023-08-06 ENCOUNTER — Encounter: Payer: Self-pay | Admitting: *Deleted

## 2023-08-07 ENCOUNTER — Encounter: Payer: Self-pay | Admitting: *Deleted

## 2023-08-07 ENCOUNTER — Other Ambulatory Visit: Payer: Medicaid Other

## 2023-08-07 ENCOUNTER — Other Ambulatory Visit: Payer: Self-pay

## 2023-08-07 ENCOUNTER — Ambulatory Visit: Payer: Medicaid Other | Admitting: Family Medicine

## 2023-08-07 VITALS — BP 108/75 | HR 101 | Wt 263.2 lb

## 2023-08-07 DIAGNOSIS — Z3A27 27 weeks gestation of pregnancy: Secondary | ICD-10-CM

## 2023-08-07 DIAGNOSIS — B009 Herpesviral infection, unspecified: Secondary | ICD-10-CM

## 2023-08-07 DIAGNOSIS — Z3480 Encounter for supervision of other normal pregnancy, unspecified trimester: Secondary | ICD-10-CM

## 2023-08-07 DIAGNOSIS — O0992 Supervision of high risk pregnancy, unspecified, second trimester: Secondary | ICD-10-CM

## 2023-08-07 DIAGNOSIS — O0990 Supervision of high risk pregnancy, unspecified, unspecified trimester: Secondary | ICD-10-CM

## 2023-08-07 DIAGNOSIS — O99212 Obesity complicating pregnancy, second trimester: Secondary | ICD-10-CM

## 2023-08-07 DIAGNOSIS — Z1332 Encounter for screening for maternal depression: Secondary | ICD-10-CM | POA: Diagnosis not present

## 2023-08-07 DIAGNOSIS — O99213 Obesity complicating pregnancy, third trimester: Secondary | ICD-10-CM

## 2023-08-07 DIAGNOSIS — Z23 Encounter for immunization: Secondary | ICD-10-CM

## 2023-08-07 DIAGNOSIS — O34219 Maternal care for unspecified type scar from previous cesarean delivery: Secondary | ICD-10-CM

## 2023-08-07 DIAGNOSIS — Z8759 Personal history of other complications of pregnancy, childbirth and the puerperium: Secondary | ICD-10-CM

## 2023-08-07 NOTE — Progress Notes (Addendum)
PRENATAL VISIT NOTE:Centering Pregnancy Group 15, Session 6   Subjective:  Chelsea Baldwin is a 32 y.o. G4P3003 at [redacted]w[redacted]d being seen today for ongoing prenatal care.  She is currently monitored for the following issues for this high-risk pregnancy and has Anemia; Mild tetrahydrocannabinol (THC) abuse; Hemoglobin C trait (HCC); History of gestational hypertension; Supervision of high risk pregnancy, antepartum; HSV (herpes simplex virus) infection; Previous cesarean delivery affecting pregnancy, antepartum; and Obesity affecting pregnancy on their problem list.  Patient reports no complaints.  Contractions: Not present. Vag. Bleeding: None.  Movement: Present. Denies leaking of fluid.   The following portions of the patient's history were reviewed and updated as appropriate: allergies, current medications, past family history, past medical history, past social history, past surgical history and problem list.   Objective:   Vitals:   08/07/23 1322  BP: 108/75  Pulse: (!) 101  Weight: 263 lb 3.2 oz (119.4 kg)    Fetal Status: Fetal Heart Rate (bpm): 140 Fundal Height: 28 cm Movement: Present     General:  Alert, oriented and cooperative. Patient is in no acute distress.  Skin: Skin is warm and dry. No rash noted.   Cardiovascular: Normal heart rate noted  Respiratory: Normal respiratory effort, no problems with respiration noted  Abdomen: Soft, gravid, appropriate for gestational age.  Pain/Pressure: Absent     Pelvic: Cervical exam deferred        Extremities: Normal range of motion.     Mental Status: Normal mood and affect. Normal behavior. Normal judgment and thought content.   Assessment and Plan:  Pregnancy: G4P3003 at [redacted]w[redacted]d 1. Supervision of high risk pregnancy, antepartum (Primary)  Centering Pregnancy, Session#6: Reviewed resources in [employer].  Facilitated discussion today: family planning/reproductive life planning, coping strategies   Mindfulness activity  with positive affirmations   Fundal height and FHR appropriate today unless noted otherwise in plan. Patient to continue group care.   Desires BTS-- specifically with the filshie clips-- signed consent today and note in pink sticky about desire for filshie. Counseled about recommendations for salpingectomy. Also discussed the filshie clips are just as permanent. She reports she does nto desire more children and wants a permanent option.   - Tdap vaccine greater than or equal to 7yo IM  2. Previous cesarean delivery affecting pregnancy, antepartum TOLAC Desired Previous SVD-> CS for NRFHT--> VBAC Sign consent next visit  3. Obesity affecting pregnancy in third trimester, unspecified obesity type TWG=1 lb 3.2 oz (0.544 kg)   4. HSV (herpes simplex virus) infection PPX at 34-36 weeks  5. History of gestational hypertension BP WNL today  Preterm labor symptoms and general obstetric precautions including but not limited to vaginal bleeding, contractions, leaking of fluid and fetal movement were reviewed in detail with the patient. Please refer to After Visit Summary for other counseling recommendations.   Return in about 3 weeks (around 08/28/2023) for Centering Pregnancy.  Future Appointments  Date Time Provider Department Center  08/19/2023  8:45 AM Edrick Oh, PT [facility] None  08/20/2023  9:30 AM [facility] US4 [facility]  08/26/2023  9:30 AM Edrick Oh, PT [facility] None  08/28/2023  2:00 PM CENTERING PROVIDER [facility]  09/02/2023  8:45 AM Edrick Oh, PT [facility] None  09/04/2023  9:00 AM CENTERING PROVIDER [facility]  09/09/2023  8:45 AM Edrick Oh, PT [facility] None  09/16/2023  8:45 AM Edrick Oh, PT [facility] None  09/18/2023  9:00 AM CENTERING PROVIDER [facility]  09/25/2023  11:00 AM Barbaraann Faster, PT [facility] None  10/02/2023  9:00 AM CENTERING PROVIDER [facility]  10/03/2023 10:15 AM Barbaraann Faster, PT [facility] None    Federico Flake,  MD

## 2023-08-07 NOTE — Progress Notes (Signed)
Pregnancy risk form completed. Bilateral Tubal Sterilization form completed.  Chelsea Baldwin

## 2023-08-08 ENCOUNTER — Other Ambulatory Visit: Payer: Self-pay | Admitting: Family Medicine

## 2023-08-08 DIAGNOSIS — D508 Other iron deficiency anemias: Secondary | ICD-10-CM

## 2023-08-08 DIAGNOSIS — O99013 Anemia complicating pregnancy, third trimester: Secondary | ICD-10-CM

## 2023-08-08 LAB — GLUCOSE TOLERANCE, 2 HOURS W/ 1HR
Glucose, 1 hour: 129 mg/dL (ref 70–179)
Glucose, 2 hour: 100 mg/dL (ref 70–152)
Glucose, Fasting: 73 mg/dL (ref 70–91)

## 2023-08-08 LAB — CBC
Hematocrit: 29.4 % — ABNORMAL LOW (ref 34.0–46.6)
Hemoglobin: 9.9 g/dL — ABNORMAL LOW (ref 11.1–15.9)
MCH: 28.4 pg (ref 26.6–33.0)
MCHC: 33.7 g/dL (ref 31.5–35.7)
MCV: 84 fL (ref 79–97)
Platelets: 278 10*3/uL (ref 150–450)
RBC: 3.49 x10E6/uL — ABNORMAL LOW (ref 3.77–5.28)
RDW: 13.2 % (ref 11.7–15.4)
WBC: 9.8 10*3/uL (ref 3.4–10.8)

## 2023-08-08 LAB — HIV ANTIBODY (ROUTINE TESTING W REFLEX): HIV Screen 4th Generation wRfx: NONREACTIVE

## 2023-08-08 LAB — RPR: RPR Ser Ql: NONREACTIVE

## 2023-08-08 MED ORDER — FERRIC MALTOL 30 MG PO CAPS: 1.0000 | ORAL_CAPSULE | Freq: Every day | ORAL | 3 refills | Status: DC

## 2023-08-12 ENCOUNTER — Ambulatory Visit: Payer: Medicaid Other

## 2023-08-12 ENCOUNTER — Other Ambulatory Visit: Payer: Medicaid Other

## 2023-08-12 DIAGNOSIS — R293 Abnormal posture: Secondary | ICD-10-CM | POA: Diagnosis not present

## 2023-08-12 DIAGNOSIS — M6281 Muscle weakness (generalized): Secondary | ICD-10-CM

## 2023-08-12 DIAGNOSIS — M62838 Other muscle spasm: Secondary | ICD-10-CM

## 2023-08-12 NOTE — Therapy (Signed)
OUTPATIENT PHYSICAL THERAPY FEMALE PELVIC Treatment   Patient Name: Chelsea Baldwin MRN: [id number] DOB:[birth year], 32 y.o., female Today's Date: 08/12/2023  END OF SESSION:  PT End of Session - 08/12/23 0910     Visit Number 4    Date for PT Re-Evaluation 11/05/23    Authorization Type HB    Authorization Time Period [employer] approved 8 visits 07/08/23-09/05/23 auth#[id number]    Authorization - Visit Number 3    Authorization - Number of Visits 8    PT Start Time 0856   late   PT Stop Time 0929    PT Time Calculation (min) 33 min    Activity Tolerance Patient tolerated treatment well    Behavior During Therapy [facility] for tasks assessed/performed               Past Medical History:  Diagnosis Date   Anemia of pregnancy 06/02/2018   Arm numbness left 08/25/2019   COVID-19 vaccine administered 02/08/2021   Encounter for administration of COVID-19 vaccine 02/07/2021   HSV (herpes simplex virus) infection 04/10/2023   Low back pain 08/25/2019   Previous cesarean delivery, antepartum 01/01/2020   Desires TOLAC, signed TOLAC consent 01/01/20     Screening for cervical cancer 10/12/2020   VBAC, delivered    Past Surgical History:  Procedure Laterality Date   CESAREAN SECTION  2017   G2    TONSILLECTOMY     Patient Active Problem List   Diagnosis Date Noted   Obesity affecting pregnancy 06/03/2023   Previous cesarean delivery affecting pregnancy, antepartum 05/01/2023   Supervision of high risk pregnancy, antepartum 04/10/2023   HSV (herpes simplex virus) infection 04/10/2023   History of gestational hypertension 01/15/2020   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   Mild tetrahydrocannabinol (THC) abuse 07/09/2019   Anemia 06/02/2018    PCP: Evette Georges, MD  REFERRING PROVIDER: Federico Flake, MD    REFERRING DIAG: [phone] (ICD-10-CM) - Pelvic pain affecting pregnancy in second trimester, antepartum  THERAPY DIAG:  Muscle weakness  (generalized)  Other muscle spasm  Abnormal posture  Rationale for Evaluation and Treatment: Rehabilitation  ONSET DATE: [redacted] weeks pregnant   SUBJECTIVE:                                                                                                                                                                                           SUBJECTIVE STATEMENT: Feeling good today. More pain with standing.     PAIN: 08/12/23: Are you having pain? Yes NPRS scale:0-6/10 Pain location:  pelvic pain and Lt hip  Pain type: aching and stabbing Pain description: constant   Aggravating factors: prolonged  position or mobility Relieving factors: resting, stretching   PRECAUTIONS: Other: [redacted]wks pregnant   RED FLAGS: None   WEIGHT BEARING RESTRICTIONS: No  FALLS:  Has patient fallen in last 6 months? No  LIVING ENVIRONMENT: Lives with: lives with their family Lives in: House/apartment  OCCUPATION: CNA  PLOF: Independent  PATIENT GOALS: to have less pain  PERTINENT HISTORY:  [id number], 21w, high-risk pregnancy and has Anemia; Mild tetrahydrocannabinol (THC) abuse; Hemoglobin C trait (HCC); History of gestational hypertension; Supervision of high risk pregnancy, antepartum; HSV (herpes simplex virus) infection; Previous cesarean delivery affecting pregnancy, antepartum; and Obesity affecting pregnancy Sexual abuse: No  BOWEL MOVEMENT: Pain with bowel movement: No Type of bowel movement:Type (Bristol Stool Scale) 4-5, Frequency daily to every other day, and Strain No Fully empty rectum: Yes:   Leakage: No Pads: No Fiber supplement: No  URINATION: Pain with urination: No Fully empty bladder: Yes:   Stream: Strong Urgency: Yes: but with pregnancy Frequency: increased with pregnancy, every 2 hours during the day and 4+x at night Leakage:  no Pads: No  INTERCOURSE: Pain with intercourse:  not active but reports positional pain with intercourse previously Ability to have  vaginal penetration:  Yes: denies dryness Climax: not painful Marinoff Scale: 0/3  PREGNANCY: Vaginal deliveries 2 Tearing Yes: sitiches with both vaginal births C-section deliveries 1 Currently pregnant Yes: 22wk  PROLAPSE: None   OBJECTIVE:  Note: Objective measures were completed at Evaluation unless otherwise noted.  DIAGNOSTIC FINDINGS:   PATIENT SURVEYS:    PFIQ-7 summary 125 (UIQ-7 38) and (POPIQ-7 86)  COGNITION: Overall cognitive status: Within functional limits for tasks assessed     SENSATION: Light touch: Deficits reports numbness and tingling on Lt outer thigh stopping about 2-3 in above knee Proprioception: Appears intact  MUSCLE LENGTH: Bil hamstrings and adductors limited by 25%  LUMBAR SPECIAL TESTS:  Single leg stance test: pain worse with standing on Lt but immediately increased pain at pubic symphysis , SI Compression/distraction test: felt better with compression and FABER test: Negative  FUNCTIONAL TESTS:  Functional squat - unable to complete full descent and limited by 50% with pain and weakness, bil knee valgus demonstrated and need of hands at knees and use of external support to return to standing.   GAIT: Decreased cadence, decreased bil step height, decreased stride length  POSTURE: rounded shoulders, forward head, increased thoracic kyphosis, and anterior pelvic tilt  PELVIC ALIGNMENT: WFL  LUMBARAROM/PROM:  A/PROM A/PROM  eval  Flexion WFL  Extension WFL  Right lateral flexion Limited by 25%  Left lateral flexion Limited by 25%  Right rotation Limited by 25%  Left rotation Limited by 25% increased pain at Lt hip reported   (Blank rows = not tested)  LOWER EXTREMITY ROM:  WFL  LOWER EXTREMITY MMT:  LT hip grossly 3+/5 with pain testing adduction only, Rt hip grossly 4/5; knees 5/5  PALPATION:   General  TTP at pubic symphysis, tightness at bil thoracic and lumbar paraspinals                External Perineal Exam  deferred                             Internal Pelvic Floor deferred  Patient confirms identification and approves PT to assess internal pelvic floor and treatment No  PELVIC MMT:   MMT eval  Vaginal   Internal Anal Sphincter   External Anal Sphincter   Puborectalis  Diastasis Recti   (Blank rows = not tested)        TONE: deferred  PROLAPSE: deferred  TODAY'S TREATMENT:                                                                                                                              DATE:   08/12/23: Seated hamstring stretch 2x20 seconds  NuStep: Level 2 x 5 minutes-PT present to discuss progress  Pelvic tilts 2x10 in hooklying  Windshield wipers x10 each Hip ER stretch 3x30s each in hooklying  Butterfly stretch 3x20 seconds TA activation with ball squeeze 5" x10, clam with yellow band Reverse clams 2x10 each 2x10 Sit to stand with transverse abdominis activation  07/25/23: Seated hamstring stretch 2x20 seconds  NuStep: Level 2 x 5 minutes-PT present to discuss progress  Pelvic tilts 2x10 in hooklying  Windshield wipers x10 each Hip ER stretch 3x30s each in hooklying  Thoracic opener x10 each-leg resting on foam roll  TA activation with ball squeeze 5" x10 Reverse clams 2x10 each 2x10 Sit to stand with transverse abdominis activation manual with taping at abdomen for improved support and decreased pain. Ktape used x5 pieces with 25% upward pull for decreased pain.  07/08/23 EVAL Examination completed, findings reviewed, pt educated on POC, HEP, and manual with taping at abdomen for improved support and decreased pain. Ktape used x5 pieces with 25% upward pull for decreased pain, pt reported immediate relief with standing. Pt also educated on log rolling for improved bed mobility and decreased strain at abdomen and pain at hips with crunch forward to come to sitting. Pt demonstrated good carry over with this during session. Pt educated on how/when to remove  tape and to remove with any skin irritation or adverse reactions. Pt verbalized understanding. Pt motivated to participate in PT and agreeable to attempt recommendations.    PATIENT EDUCATION:  Education details: Access Code: [email or web address] Person educated: Patient Education method: Explanation, Demonstration, Tactile cues, Verbal cues, and Handouts Education comprehension: verbalized understanding, returned demonstration, verbal cues required, tactile cues required, and needs further education  HOME EXERCISE PROGRAM: Access Code: [email or web address] URL: [email or web address] Date: 07/25/2023 Prepared by: Tresa Endo  Exercises - Seated Diaphragmatic Breathing  - 1 x daily - 7 x weekly - 1 sets - 10 reps - Supine Lower Trunk Rotation  - 1 x daily - 7 x weekly - 1 sets - 10 reps - Quadruped Cat Cow  - 1 x daily - 7 x weekly - 1 sets - 10 reps - 5s holds - Unilateral Supported Supine Butterfly Stretch  - 1 x daily - 7 x weekly - 1 sets - 2 reps - 30s holds - Sidelying Open Book Thoracic Rotation with Knee on Foam Roll  - 1 x daily - 7 x weekly - 1 sets - 2 reps - 30s holds - Sidelying Reverse Clamshell  - 1 x daily - 7 x weekly - 1-2 sets - 10 reps -  Clamshell  - 2-3 x daily - 7 x weekly - 1-2 sets - 10 reps - Seated Hamstring Stretch  - 2-3 x daily - 7 x weekly - 1 sets - 3 reps - 20 hold  ASSESSMENT:  CLINICAL IMPRESSION: Pt continues to report 40% reduction in pain since the start of care.  Pt is independent and compliant with HEP and is  using change of position, stretching and breathing techniques to manage pain.  PT session focused on gentle stretching for low back and hips and gentle core strengthening for improved pelvic stability. PT monitored throughout session for pain and technique.  Pt would benefit from additional PT to further address deficits.    OBJECTIVE IMPAIRMENTS: decreased activity tolerance, decreased mobility, difficulty walking, decreased strength, increased fascial  restrictions, increased muscle spasms, impaired flexibility, improper body mechanics, postural dysfunction, and pain.   ACTIVITY LIMITATIONS: carrying, lifting, bending, sitting, standing, squatting, transfers, bed mobility, and locomotion level  PARTICIPATION LIMITATIONS: community activity and occupation  PERSONAL FACTORS: Time since onset of injury/illness/exacerbation and 1 comorbidity: medical history  are also affecting patient's functional outcome.   REHAB POTENTIAL: Good  CLINICAL DECISION MAKING: Evolving/moderate complexity  EVALUATION COMPLEXITY: Moderate   GOALS: Goals reviewed with patient? Yes  [place] TERM GOALS: Target date: 08/05/23  Pt to be I with HEP.  Baseline: Goal status: MET  2.  Pt will report no more than 7/10 pain with 30 mins in one position or with one task for improved tolerance to activity with work. Baseline: up to 7/10 now (07/25/23) Goal status: INITIAL  3.  Pt to be I with breathing mechanics and relaxation technique to help decreased pain.  Baseline: stretching and breathing to control pain (07/25/23) Goal status: MET   LONG TERM GOALS: Target date: 11/05/23  Pt to be I with advanced HEP.  Baseline:  Goal status: INITIAL  2.  Pt will report no more than 4/10 pain with 30 mins in one position or with one task for improved tolerance to activity with work. Baseline:  Goal status: INITIAL  3.  Pt to demonstrate improved trunk mobility in all directions without pain for improved posture and decreased compensatory strategies with walking and standing.   Baseline:  Goal status: INITIAL  4.  Pt to demonstrate at least 5/5 bil hip strength for improved pelvic stability and functional squats with childcare.  Baseline:  Goal status: INITIAL  5.  Pt to be I with taping at abdomen if needed/helpful for pain management at home.  Baseline:  Goal status: INITIAL    PLAN:  PT FREQUENCY: 2x/week  PT DURATION:  16 sessions  PLANNED  INTERVENTIONS: 97110-Therapeutic exercises, 97530- Therapeutic activity, 97112- Neuromuscular re-education, 97535- Self Care, 16109- Manual therapy, [id number]- Aquatic Therapy, Patient/Family education, Taping, Dry Needling, Scar mobilization, Cryotherapy, Moist heat, and Biofeedback  PLAN FOR NEXT SESSION: internal pelvic floor assessment if needed and pt consents, taping at abdomen, posture strengthening, stretching hips and core gently, hips and transverse abdominis strengthening   Lorrene Reid, PT 08/12/23 9:38 AM

## 2023-08-19 ENCOUNTER — Ambulatory Visit: Payer: Medicaid Other

## 2023-08-20 ENCOUNTER — Ambulatory Visit: Payer: Medicaid Other | Attending: Maternal & Fetal Medicine

## 2023-08-20 ENCOUNTER — Other Ambulatory Visit: Payer: Self-pay | Admitting: *Deleted

## 2023-08-20 ENCOUNTER — Other Ambulatory Visit: Payer: Self-pay

## 2023-08-20 DIAGNOSIS — O34219 Maternal care for unspecified type scar from previous cesarean delivery: Secondary | ICD-10-CM | POA: Insufficient documentation

## 2023-08-20 DIAGNOSIS — Z3A28 28 weeks gestation of pregnancy: Secondary | ICD-10-CM | POA: Diagnosis not present

## 2023-08-20 DIAGNOSIS — D582 Other hemoglobinopathies: Secondary | ICD-10-CM | POA: Diagnosis not present

## 2023-08-20 DIAGNOSIS — O99213 Obesity complicating pregnancy, third trimester: Secondary | ICD-10-CM

## 2023-08-20 DIAGNOSIS — O09293 Supervision of pregnancy with other poor reproductive or obstetric history, third trimester: Secondary | ICD-10-CM | POA: Diagnosis not present

## 2023-08-20 DIAGNOSIS — E669 Obesity, unspecified: Secondary | ICD-10-CM

## 2023-08-20 DIAGNOSIS — O99212 Obesity complicating pregnancy, second trimester: Secondary | ICD-10-CM | POA: Insufficient documentation

## 2023-08-20 DIAGNOSIS — O99013 Anemia complicating pregnancy, third trimester: Secondary | ICD-10-CM

## 2023-08-20 DIAGNOSIS — F1290 Cannabis use, unspecified, uncomplicated: Secondary | ICD-10-CM

## 2023-08-20 DIAGNOSIS — O99323 Drug use complicating pregnancy, third trimester: Secondary | ICD-10-CM

## 2023-08-24 DIAGNOSIS — F411 Generalized anxiety disorder: Secondary | ICD-10-CM | POA: Diagnosis not present

## 2023-08-26 ENCOUNTER — Ambulatory Visit: Payer: Medicaid Other

## 2023-08-27 ENCOUNTER — Encounter: Payer: Self-pay | Admitting: *Deleted

## 2023-08-28 ENCOUNTER — Encounter: Payer: Medicaid Other | Admitting: Family Medicine

## 2023-08-28 NOTE — Progress Notes (Deleted)
   PRENATAL VISIT NOTE  Subjective:  Chelsea Baldwin is a 33 y.o. G4P3003 at [redacted]w[redacted]d being seen today for ongoing prenatal care.  She is currently monitored for the following issues for this {Blank single:19197::high-risk,low-risk} pregnancy and has Anemia; Mild tetrahydrocannabinol (THC) abuse; Hemoglobin C trait (HCC); History of gestational hypertension; Supervision of high risk pregnancy, antepartum; HSV (herpes simplex virus) infection; Previous cesarean delivery affecting pregnancy, antepartum; and Obesity affecting pregnancy on their problem list.  Patient reports {sx:14538}.   .  .   . Denies leaking of fluid.   The following portions of the patient's history were reviewed and updated as appropriate: allergies, current medications, past family history, past medical history, past social history, past surgical history and problem list.   Objective:  There were no vitals filed for this visit.  Fetal Status:           General:  Alert, oriented and cooperative. Patient is in no acute distress.  Skin: Skin is warm and dry. No rash noted.   Cardiovascular: Normal heart rate noted  Respiratory: Normal respiratory effort, no problems with respiration noted  Abdomen: Soft, gravid, appropriate for gestational age.        Pelvic: {Blank single:19197::Cervical exam performed in the presence of a chaperone,Cervical exam deferred}        Extremities: Normal range of motion.     Mental Status: Normal mood and affect. Normal behavior. Normal judgment and thought content.   Assessment and Plan:  Pregnancy: G4P3003 at [redacted]w[redacted]d 1. Supervision of high risk pregnancy, antepartum (Primary) ***  2. Previous cesarean delivery affecting pregnancy, antepartum ***  3. Obesity affecting pregnancy in third trimester, unspecified obesity type ***  4. HSV (herpes simplex virus) infection ***  5. History of gestational hypertension ***  {Blank single:19197::Term,Preterm} labor symptoms and  general obstetric precautions including but not limited to vaginal bleeding, contractions, leaking of fluid and fetal movement were reviewed in detail with the patient. Please refer to After Visit Summary for other counseling recommendations.   No follow-ups on file.  Future Appointments  Date Time Provider Department Center  08/28/2023  2:00 PM CENTERING PROVIDER [facility]  09/02/2023  8:45 AM Sophie Burnard HERO, PT [facility] None  09/04/2023  9:00 AM CENTERING PROVIDER [facility]  09/09/2023  8:45 AM Sophie Burnard HERO, PT [facility] None  09/16/2023  8:45 AM Sophie Burnard HERO, PT [facility] None  09/18/2023  9:00 AM CENTERING PROVIDER [facility]  09/25/2023 11:00 AM Donah Darryle RAMAN, PT [facility] None  10/01/2023  8:30 AM [facility] US[facility]  10/02/2023  9:00 AM CENTERING PROVIDER [facility]  10/03/2023 10:15 AM Donah Darryle RAMAN, PT [facility] None    Suzen Maryan Masters, MD

## 2023-08-31 DIAGNOSIS — F411 Generalized anxiety disorder: Secondary | ICD-10-CM | POA: Diagnosis not present

## 2023-09-04 ENCOUNTER — Ambulatory Visit (INDEPENDENT_AMBULATORY_CARE_PROVIDER_SITE_OTHER): Payer: Medicaid Other | Admitting: Family Medicine

## 2023-09-04 VITALS — BP 138/83 | HR 118 | Wt 268.0 lb

## 2023-09-04 DIAGNOSIS — O0993 Supervision of high risk pregnancy, unspecified, third trimester: Secondary | ICD-10-CM

## 2023-09-04 DIAGNOSIS — Z3A31 31 weeks gestation of pregnancy: Secondary | ICD-10-CM

## 2023-09-04 DIAGNOSIS — O34219 Maternal care for unspecified type scar from previous cesarean delivery: Secondary | ICD-10-CM

## 2023-09-04 DIAGNOSIS — O99213 Obesity complicating pregnancy, third trimester: Secondary | ICD-10-CM

## 2023-09-04 DIAGNOSIS — Z8759 Personal history of other complications of pregnancy, childbirth and the puerperium: Secondary | ICD-10-CM

## 2023-09-04 DIAGNOSIS — O0990 Supervision of high risk pregnancy, unspecified, unspecified trimester: Secondary | ICD-10-CM

## 2023-09-09 ENCOUNTER — Ambulatory Visit: Payer: Medicaid Other | Attending: Family Medicine

## 2023-09-09 DIAGNOSIS — R293 Abnormal posture: Secondary | ICD-10-CM | POA: Diagnosis not present

## 2023-09-09 DIAGNOSIS — M62838 Other muscle spasm: Secondary | ICD-10-CM | POA: Insufficient documentation

## 2023-09-09 DIAGNOSIS — M6281 Muscle weakness (generalized): Secondary | ICD-10-CM | POA: Diagnosis not present

## 2023-09-09 NOTE — Therapy (Signed)
OUTPATIENT PHYSICAL THERAPY FEMALE PELVIC Treatment   Patient Name: Chelsea Baldwin MRN: [id number] DOB:[birth year], 33 y.o., female Today's Date: 09/09/2023  END OF SESSION:  PT End of Session - 09/09/23 0931     Visit Number 5    Date for PT Re-Evaluation 11/05/23    Authorization Time Period 4 visits 09/09/2023-10/08/2023    Authorization - Visit Number 1    Authorization - Number of Visits 4    PT Start Time 0847    PT Stop Time 0928    PT Time Calculation (min) 41 min    Activity Tolerance Patient tolerated treatment well    Behavior During Therapy [facility] for tasks assessed/performed                Past Medical History:  Diagnosis Date   Anemia of pregnancy 06/02/2018   Arm numbness left 08/25/2019   COVID-19 vaccine administered 02/08/2021   Encounter for administration of COVID-19 vaccine 02/07/2021   HSV (herpes simplex virus) infection 04/10/2023   Low back pain 08/25/2019   Previous cesarean delivery, antepartum 01/01/2020   Desires TOLAC, signed TOLAC consent 01/01/20     Screening for cervical cancer 10/12/2020   VBAC, delivered    Past Surgical History:  Procedure Laterality Date   CESAREAN SECTION  2017   G2    TONSILLECTOMY     Patient Active Problem List   Diagnosis Date Noted   Obesity affecting pregnancy 06/03/2023   Previous cesarean delivery affecting pregnancy, antepartum 05/01/2023   Supervision of high risk pregnancy, antepartum 04/10/2023   HSV (herpes simplex virus) infection 04/10/2023   History of gestational hypertension 01/15/2020   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   Mild tetrahydrocannabinol (THC) abuse 07/09/2019   Anemia 06/02/2018    PCP: Evette Georges, MD  REFERRING PROVIDER: Federico Flake, MD    REFERRING DIAG: [phone] (ICD-10-CM) - Pelvic pain affecting pregnancy in second trimester, antepartum  THERAPY DIAG:  Muscle weakness (generalized)  Other muscle spasm  Abnormal posture  Rationale for  Evaluation and Treatment: Rehabilitation  ONSET DATE: [redacted] weeks pregnant   SUBJECTIVE:                                                                                                                                                                                           SUBJECTIVE STATEMENT: I've been doing exercises.  I want to do the tape today, it helps me.  PAIN: 09/09/23: Are you having pain? Yes NPRS scale:0-5/10 Pain location:  pelvic pain and Lt hip  Pain type: aching and stabbing Pain description: constant   Aggravating factors: prolonged position or mobility Relieving factors:  resting, stretching   PRECAUTIONS: Other: [redacted]wks pregnant   RED FLAGS: None   WEIGHT BEARING RESTRICTIONS: No  FALLS:  Has patient fallen in last 6 months? No  LIVING ENVIRONMENT: Lives with: lives with their family Lives in: House/apartment  OCCUPATION: CNA  PLOF: Independent  PATIENT GOALS: to have less pain  PERTINENT HISTORY:  [id number], 21w, high-risk pregnancy and has Anemia; Mild tetrahydrocannabinol (THC) abuse; Hemoglobin C trait (HCC); History of gestational hypertension; Supervision of high risk pregnancy, antepartum; HSV (herpes simplex virus) infection; Previous cesarean delivery affecting pregnancy, antepartum; and Obesity affecting pregnancy Sexual abuse: No  BOWEL MOVEMENT: Pain with bowel movement: No Type of bowel movement:Type (Bristol Stool Scale) 4-5, Frequency daily to every other day, and Strain No Fully empty rectum: Yes:   Leakage: No Pads: No Fiber supplement: No  URINATION: Pain with urination: No Fully empty bladder: Yes:   Stream: Strong Urgency: Yes: but with pregnancy Frequency: increased with pregnancy, every 2 hours during the day and 4+x at night Leakage:  no Pads: No  INTERCOURSE: Pain with intercourse:  not active but reports positional pain with intercourse previously Ability to have vaginal penetration:  Yes: denies dryness Climax: not  painful Marinoff Scale: 0/3  PREGNANCY: Vaginal deliveries 2 Tearing Yes: sitiches with both vaginal births C-section deliveries 1 Currently pregnant Yes: 22wk  PROLAPSE: None   OBJECTIVE:  Note: Objective measures were completed at Evaluation unless otherwise noted.  DIAGNOSTIC FINDINGS:   PATIENT SURVEYS:    PFIQ-7 summary 125 (UIQ-7 38) and (POPIQ-7 86)  COGNITION: Overall cognitive status: Within functional limits for tasks assessed     SENSATION: Light touch: Deficits reports numbness and tingling on Lt outer thigh stopping about 2-3 in above knee Proprioception: Appears intact  MUSCLE LENGTH: Bil hamstrings and adductors limited by 25%  LUMBAR SPECIAL TESTS:  Single leg stance test: pain worse with standing on Lt but immediately increased pain at pubic symphysis , SI Compression/distraction test: felt better with compression and FABER test: Negative  FUNCTIONAL TESTS:  Functional squat - unable to complete full descent and limited by 50% with pain and weakness, bil knee valgus demonstrated and need of hands at knees and use of external support to return to standing.   GAIT: Decreased cadence, decreased bil step height, decreased stride length  POSTURE: rounded shoulders, forward head, increased thoracic kyphosis, and anterior pelvic tilt  PELVIC ALIGNMENT: WFL  LUMBARAROM/PROM:  A/PROM A/PROM  eval  Flexion WFL  Extension WFL  Right lateral flexion Limited by 25%  Left lateral flexion Limited by 25%  Right rotation Limited by 25%  Left rotation Limited by 25% increased pain at Lt hip reported   (Blank rows = not tested)  LOWER EXTREMITY ROM:  WFL  LOWER EXTREMITY MMT:  LT hip grossly 3+/5 with pain testing adduction only, Rt hip grossly 4/5; knees 5/5  PALPATION:   General  TTP at pubic symphysis, tightness at bil thoracic and lumbar paraspinals                External Perineal Exam deferred                             Internal Pelvic Floor  deferred  Patient confirms identification and approves PT to assess internal pelvic floor and treatment No  PELVIC MMT:   MMT eval  Vaginal   Internal Anal Sphincter   External Anal Sphincter   Puborectalis   Diastasis Recti   (  Blank rows = not tested)        TONE: deferred  PROLAPSE: deferred  TODAY'S TREATMENT:                                                                                                                              DATE:   09/09/23: Seated hamstring stretch 2x20 seconds  NuStep: Level 2 x 5 minutes-PT present to discuss progress  Pelvic tilts 2x10 in hooklying  Seated on blue ball: pelvic circles bil, rocking side to side and front to back  Hip ER stretch 3x30s each in hooklying  TA activation with ball squeeze 5" x10, clam with yellow band 2x10 Sit to stand with transverse abdominis activation Manual: kinesiotape at abdomen for improved support and decreased pain. Ktape used x5 pieces with 25% upward pull for decreased pain.  08/12/23: Seated hamstring stretch 2x20 seconds  NuStep: Level 2 x 5 minutes-PT present to discuss progress  Pelvic tilts 2x10 in hooklying  Windshield wipers x10 each Hip ER stretch 3x30s each in hooklying  Butterfly stretch 3x20 seconds TA activation with ball squeeze 5" x10, clam with yellow band Reverse clams 2x10 each 2x10 Sit to stand with transverse abdominis activation  07/25/23: Seated hamstring stretch 2x20 seconds  NuStep: Level 2 x 5 minutes-PT present to discuss progress  Pelvic tilts 2x10 in hooklying  Windshield wipers x10 each Hip ER stretch 3x30s each in hooklying  Thoracic opener x10 each-leg resting on foam roll  TA activation with ball squeeze 5" x10 Reverse clams 2x10 each 2x10 Sit to stand with transverse abdominis activation manual with taping at abdomen for improved support and decreased pain. Ktape used x5 pieces with 25% upward pull for decreased pain.   PATIENT EDUCATION:  Education details:  Access Code: [email or web address] Person educated: Patient Education method: Explanation, Demonstration, Tactile cues, Verbal cues, and Handouts Education comprehension: verbalized understanding, returned demonstration, verbal cues required, tactile cues required, and needs further education  HOME EXERCISE PROGRAM: Access Code: [email or web address] URL: [email or web address] Date: 07/25/2023 Prepared by: Tresa Endo  Exercises - Seated Diaphragmatic Breathing  - 1 x daily - 7 x weekly - 1 sets - 10 reps - Supine Lower Trunk Rotation  - 1 x daily - 7 x weekly - 1 sets - 10 reps - Quadruped Cat Cow  - 1 x daily - 7 x weekly - 1 sets - 10 reps - 5s holds - Unilateral Supported Supine Butterfly Stretch  - 1 x daily - 7 x weekly - 1 sets - 2 reps - 30s holds - Sidelying Open Book Thoracic Rotation with Knee on Foam Roll  - 1 x daily - 7 x weekly - 1 sets - 2 reps - 30s holds - Sidelying Reverse Clamshell  - 1 x daily - 7 x weekly - 1-2 sets - 10 reps - Clamshell  - 2-3 x daily - 7 x weekly - 1-2 sets - 10 reps - Seated Hamstring Stretch  - 2-3  x daily - 7 x weekly - 1 sets - 3 reps - 20 hold  ASSESSMENT:  CLINICAL IMPRESSION: Lapse in treatment due to holidays and weather changes. Pt continues to report 40% reduction in pain since the start of care.  Pt is independent and compliant with HEP and is  using change of position, stretching and breathing techniques to manage pain. She responds well to kinesiotaping for support and PT instructed pt how to do this at home.  PT session focused on gentle stretching for low back and hips and gentle core strengthening for improved pelvic stability. PT monitored throughout session for pain and technique.  Pt would benefit from additional PT to further address deficits.    OBJECTIVE IMPAIRMENTS: decreased activity tolerance, decreased mobility, difficulty walking, decreased strength, increased fascial restrictions, increased muscle spasms, impaired flexibility, improper body  mechanics, postural dysfunction, and pain.   ACTIVITY LIMITATIONS: carrying, lifting, bending, sitting, standing, squatting, transfers, bed mobility, and locomotion level  PARTICIPATION LIMITATIONS: community activity and occupation  PERSONAL FACTORS: Time since onset of injury/illness/exacerbation and 1 comorbidity: medical history  are also affecting patient's functional outcome.   REHAB POTENTIAL: Good  CLINICAL DECISION MAKING: Evolving/moderate complexity  EVALUATION COMPLEXITY: Moderate   GOALS: Goals reviewed with patient? Yes  [place] TERM GOALS: Target date: 08/05/23  Pt to be I with HEP.  Baseline: Goal status: MET  2.  Pt will report no more than 7/10 pain with 30 mins in one position or with one task for improved tolerance to activity with work. Baseline: 5-6/10 max (09/09/23) Goal status: MET  3.  Pt to be I with breathing mechanics and relaxation technique to help decreased pain.  Baseline: stretching and breathing to control pain (07/25/23) Goal status: MET   LONG TERM GOALS: Target date: 11/05/23  Pt to be I with advanced HEP.  Baseline:  Goal status: INITIAL  2.  Pt will report no more than 4/10 pain with 30 mins in one position or with one task for improved tolerance to activity with work. Baseline: 5-6/10 (09/09/23) Goal status: In progress   3.  Pt to demonstrate improved trunk mobility in all directions without pain for improved posture and decreased compensatory strategies with walking and standing.   Baseline:  Goal status: INITIAL  4.  Pt to demonstrate at least 5/5 bil hip strength for improved pelvic stability and functional squats with childcare.  Baseline:  Goal status: INITIAL  5.  Pt to be I with taping at abdomen if needed/helpful for pain management at home.  Baseline: responds well to tape and PT instructed on application (09/09/23) Goal status: In progress     PLAN:  PT FREQUENCY: 2x/week  PT DURATION:  16 sessions  PLANNED  INTERVENTIONS: 97110-Therapeutic exercises, 97530- Therapeutic activity, 97112- Neuromuscular re-education, 97535- Self Care, 62130- Manual therapy, [phone]- Aquatic Therapy, Patient/Family education, Taping, Dry Needling, Scar mobilization, Cryotherapy, Moist heat, and Biofeedback  PLAN FOR NEXT SESSION: taping at abdomen, posture strengthening, stretching hips and core gently, hips and transverse abdominis strengthening   Lorrene Reid, PT 09/09/23 9:33 AM   [facility] [address] [place], [place] [address] Phone # [phone] Fax [phone]

## 2023-09-10 NOTE — Progress Notes (Signed)
   PRENATAL VISIT NOTE: Centering Pregnancy Group 15, Session 8   Subjective:  Chelsea Baldwin is a 33 y.o. G4P3003 at [redacted]w[redacted]d being seen today for ongoing prenatal care.  She is currently monitored for the following issues for this high-risk pregnancy and has Anemia; Mild tetrahydrocannabinol (THC) abuse; Hemoglobin C trait (HCC); History of gestational hypertension; Supervision of high risk pregnancy, antepartum; HSV (herpes simplex virus) infection; Previous cesarean delivery affecting pregnancy, antepartum; and Obesity affecting pregnancy on their problem list.  Patient reports no complaints.  Contractions: Irritability. Vag. Bleeding: None.  Movement: Present. Denies leaking of fluid.   The following portions of the patient's history were reviewed and updated as appropriate: allergies, current medications, past family history, past medical history, past social history, past surgical history and problem list.   Objective:   Vitals:   09/04/23 0904  BP: 138/83  Pulse: (!) 118  Weight: 268 lb (121.6 kg)    Fetal Status: Fetal Heart Rate (bpm): 134 Fundal Height: 33 cm Movement: Present     General:  Alert, oriented and cooperative. Patient is in no acute distress.  Skin: Skin is warm and dry. No rash noted.   Cardiovascular: Normal heart rate noted  Respiratory: Normal respiratory effort, no problems with respiration noted  Abdomen: Soft, gravid, appropriate for gestational age.  Pain/Pressure: Absent     Pelvic: Cervical exam deferred        Extremities: Normal range of motion.  Edema: None  Mental Status: Normal mood and affect. Normal behavior. Normal judgment and thought content.   Assessment and Plan:  Pregnancy: G4P3003 at [redacted]w[redacted]d  1. Previous cesarean delivery affecting pregnancy, antepartum Desires TOLAC Signed consent  2. Supervision of high risk pregnancy, antepartum (Primary)   Centering Pregnancy, Session#8: Reviewed resources in [employer].   Facilitated  discussion today:  Newborn safety, delivery planning, postpartum planning and follow other topics (family planning, breastfeeding)  Fundal height and FHR appropriate today unless noted otherwise in plan. Patient to continue group care.  Doing PT for back/pelvic pressure and helping Desires BTS with [place] specifically  3. Obesity affecting pregnancy in third trimester, unspecified obesity type TWG=6 lb (2.722 kg) which is within goal  4. History of gestational hypertension BP WNL  5. [redacted] weeks gestation of pregnancy   Preterm labor symptoms and general obstetric precautions including but not limited to vaginal bleeding, contractions, leaking of fluid and fetal movement were reviewed in detail with the patient. Please refer to After Visit Summary for other counseling recommendations.   No follow-ups on file.  Future Appointments  Date Time Provider Department Center  09/16/2023  8:45 AM Edrick Oh, PT [facility] None  09/18/2023  9:00 AM CENTERING PROVIDER [facility]  09/25/2023 11:00 AM Barbaraann Faster, PT [facility] None  10/01/2023  8:30 AM [facility] US4 [facility]  10/02/2023  9:00 AM CENTERING PROVIDER [facility]  10/03/2023 10:15 AM Barbaraann Faster, PT [facility] None  10/09/2023  9:00 AM [facility] US1 [facility]  10/09/2023 10:15 AM [place] Bing, MD [facility]  10/16/2023  9:00 AM [facility] US1 [facility]  10/16/2023 10:15 AM [place] Bing, MD [facility]    Federico Flake, MD

## 2023-09-16 ENCOUNTER — Ambulatory Visit: Payer: Medicaid Other

## 2023-09-18 ENCOUNTER — Encounter: Payer: Medicaid Other | Admitting: Family Medicine

## 2023-09-19 DIAGNOSIS — F411 Generalized anxiety disorder: Secondary | ICD-10-CM | POA: Diagnosis not present

## 2023-09-25 ENCOUNTER — Ambulatory Visit: Payer: Medicaid Other | Attending: Family Medicine | Admitting: Physical Therapy

## 2023-09-25 ENCOUNTER — Encounter: Payer: Self-pay | Admitting: Physical Therapy

## 2023-09-25 DIAGNOSIS — M6281 Muscle weakness (generalized): Secondary | ICD-10-CM | POA: Diagnosis not present

## 2023-09-25 DIAGNOSIS — M62838 Other muscle spasm: Secondary | ICD-10-CM | POA: Diagnosis not present

## 2023-09-25 DIAGNOSIS — R293 Abnormal posture: Secondary | ICD-10-CM | POA: Diagnosis not present

## 2023-09-25 NOTE — Therapy (Addendum)
 OUTPATIENT PHYSICAL THERAPY FEMALE PELVIC Treatment   Patient Name: Chelsea Baldwin MRN: [id number] DOB:August 25, 1990, 33 y.o., female Today's Date: 09/25/2023  END OF SESSION:  PT End of Session - 09/25/23 1402     Visit Number 6    Date for PT Re-Evaluation 11/05/23    Authorization Type HB    Authorization Time Period 4 visits 09/09/2023-10/08/2023    Authorization - Visit Number 2    Authorization - Number of Visits 4    PT Start Time 1401    PT Stop Time 1439    PT Time Calculation (min) 38 min    Activity Tolerance Patient tolerated treatment well    Behavior During Therapy [facility] for tasks assessed/performed                Past Medical History:  Diagnosis Date   Anemia of pregnancy 06/02/2018   Arm numbness left 08/25/2019   COVID-19 vaccine administered 02/08/2021   Encounter for administration of COVID-19 vaccine 02/07/2021   HSV (herpes simplex virus) infection 04/10/2023   Low back pain 08/25/2019   Previous cesarean delivery, antepartum 01/01/2020   Desires TOLAC, signed TOLAC consent 01/01/20     Screening for cervical cancer 10/12/2020   VBAC, delivered    Past Surgical History:  Procedure Laterality Date   CESAREAN SECTION  2017   G2    TONSILLECTOMY     Patient Active Problem List   Diagnosis Date Noted   Obesity affecting pregnancy 06/03/2023   Previous cesarean delivery affecting pregnancy, antepartum 05/01/2023   Supervision of high risk pregnancy, antepartum 04/10/2023   HSV (herpes simplex virus) infection 04/10/2023   History of gestational hypertension 01/15/2020   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   Mild tetrahydrocannabinol (THC) abuse 07/09/2019   Anemia 06/02/2018    PCP: Elna Redo, MD  REFERRING PROVIDER: Eldonna Suzen Octave, MD    REFERRING DIAG: [phone] (ICD-10-CM) - Pelvic pain affecting pregnancy in second trimester, antepartum  THERAPY DIAG:  Muscle weakness (generalized)  Other muscle spasm  Abnormal  posture  Rationale for Evaluation and Treatment: Rehabilitation  ONSET DATE: [redacted] weeks pregnant   SUBJECTIVE:                                                                                                                                                                                           SUBJECTIVE STATEMENT: HEP are helping when having pain but pain does come back later. Belly band is helping a lot.   PAIN: 09/25/23 : Are you having pain? Yes NPRS scale:5/10 Pain location:  pelvic pain   Pain type: aching and stabbing Pain description: constant  Aggravating factors: prolonged position or mobility Relieving factors: resting, stretching   PRECAUTIONS: Other: [redacted]wks pregnant   RED FLAGS: None   WEIGHT BEARING RESTRICTIONS: No  FALLS:  Has patient fallen in last 6 months? No  LIVING ENVIRONMENT: Lives with: lives with their family Lives in: House/apartment  OCCUPATION: CNA  PLOF: Independent  PATIENT GOALS: to have less pain  PERTINENT HISTORY:  [id number], 21w, high-risk pregnancy and has Anemia; Mild tetrahydrocannabinol (THC) abuse; Hemoglobin C trait (HCC); History of gestational hypertension; Supervision of high risk pregnancy, antepartum; HSV (herpes simplex virus) infection; Previous cesarean delivery affecting pregnancy, antepartum; and Obesity affecting pregnancy Sexual abuse: No  BOWEL MOVEMENT: Pain with bowel movement: No Type of bowel movement:Type (Bristol Stool Scale) 4-5, Frequency daily to every other day, and Strain No Fully empty rectum: Yes:   Leakage: No Pads: No Fiber supplement: No  URINATION: Pain with urination: No Fully empty bladder: Yes:   Stream: Strong Urgency: Yes: but with pregnancy Frequency: increased with pregnancy, every 2 hours during the day and 4+x at night Leakage:  no Pads: No  INTERCOURSE: Pain with intercourse:  not active but reports positional pain with intercourse previously Ability to have vaginal  penetration:  Yes: denies dryness Climax: not painful Marinoff Scale: 0/3  PREGNANCY: Vaginal deliveries 2 Tearing Yes: sitiches with both vaginal births C-section deliveries 1 Currently pregnant Yes: 22wk  PROLAPSE: None   OBJECTIVE:  Note: Objective measures were completed at Evaluation unless otherwise noted.  DIAGNOSTIC FINDINGS:   PATIENT SURVEYS:    PFIQ-7 summary 125 (UIQ-7 38) and (POPIQ-7 86)  COGNITION: Overall cognitive status: Within functional limits for tasks assessed     SENSATION: Light touch: Deficits reports numbness and tingling on Lt outer thigh stopping about 2-3 in above knee Proprioception: Appears intact  MUSCLE LENGTH: Bil hamstrings and adductors limited by 25%  LUMBAR SPECIAL TESTS:  Single leg stance test: pain worse with standing on Lt but immediately increased pain at pubic symphysis , SI Compression/distraction test: felt better with compression and FABER test: Negative  FUNCTIONAL TESTS:  Functional squat - unable to complete full descent and limited by 50% with pain and weakness, bil knee valgus demonstrated and need of hands at knees and use of external support to return to standing.   GAIT: Decreased cadence, decreased bil step height, decreased stride length  POSTURE: rounded shoulders, forward head, increased thoracic kyphosis, and anterior pelvic tilt  PELVIC ALIGNMENT: WFL  LUMBARAROM/PROM:  A/PROM A/PROM  eval  Flexion WFL  Extension WFL  Right lateral flexion Limited by 25%  Left lateral flexion Limited by 25%  Right rotation Limited by 25%  Left rotation Limited by 25% increased pain at Lt hip reported   (Blank rows = not tested)  LOWER EXTREMITY ROM:  WFL  LOWER EXTREMITY MMT:  LT hip grossly 3+/5 with pain testing adduction only, Rt hip grossly 4/5; knees 5/5  PALPATION:   General  TTP at pubic symphysis, tightness at bil thoracic and lumbar paraspinals                External Perineal Exam deferred                              Internal Pelvic Floor deferred  Patient confirms identification and approves PT to assess internal pelvic floor and treatment No  PELVIC MMT:   MMT eval  Vaginal   Internal Anal Sphincter   External Anal Sphincter  Puborectalis   Diastasis Recti   (Blank rows = not tested)        TONE: deferred  PROLAPSE: deferred  TODAY'S TREATMENT:                                                                                                                              DATE:   09/25/23: Seated transverse abdominis activation with gentle exhale 2x10 2x10 Sit to stand with transverse abdominis activation from lowest mat setting Hooklying with head elevated with alt marching with transverse abdominis activation +exhale 2x10 2x10 hooklying green band hip abduction + exhale Sidelying reverse clams green band 2x10 each Childs pose with alt hip IR/ER x10 Pt had questions regarding labor if she is able to have VBAC. PT educated pt on comfort positions that may be helpful during labor. However pt educated these do not substitute for medical recommendations or needs that may arise during labor and all medical advice/recommendations for doctor should be followed. Pt agreed.   09/09/23: Seated hamstring stretch 2x20 seconds  NuStep: Level 2 x 5 minutes-PT present to discuss progress  Pelvic tilts 2x10 in hooklying  Seated on blue ball: pelvic circles bil, rocking side to side and front to back  Hip ER stretch 3x30s each in hooklying  TA activation with ball squeeze 5 x10, clam with yellow band 2x10 Sit to stand with transverse abdominis activation Manual: kinesiotape at abdomen for improved support and decreased pain. Ktape used x5 pieces with 25% upward pull for decreased pain.  08/12/23: Seated hamstring stretch 2x20 seconds  NuStep: Level 2 x 5 minutes-PT present to discuss progress  Pelvic tilts 2x10 in hooklying  Windshield wipers x10 each Hip ER stretch 3x30s  each in hooklying  Butterfly stretch 3x20 seconds TA activation with ball squeeze 5 x10, clam with yellow band Reverse clams 2x10 each 2x10 Sit to stand with transverse abdominis activation   PATIENT EDUCATION:  Education details: Access Code: [email or web address] Person educated: Patient Education method: Explanation, Demonstration, Tactile cues, Verbal cues, and Handouts Education comprehension: verbalized understanding, returned demonstration, verbal cues required, tactile cues required, and needs further education  HOME EXERCISE PROGRAM: Access Code: [email or web address] URL: [email or web address] Date: 07/25/2023 Prepared by: Burnard  Exercises - Seated Diaphragmatic Breathing  - 1 x daily - 7 x weekly - 1 sets - 10 reps - Supine Lower Trunk Rotation  - 1 x daily - 7 x weekly - 1 sets - 10 reps - Quadruped Cat Cow  - 1 x daily - 7 x weekly - 1 sets - 10 reps - 5s holds - Unilateral Supported Supine Butterfly Stretch  - 1 x daily - 7 x weekly - 1 sets - 2 reps - 30s holds - Sidelying Open Book Thoracic Rotation with Knee on Foam Roll  - 1 x daily - 7 x weekly - 1 sets - 2 reps - 30s holds - Sidelying Reverse Clamshell  - 1 x daily - 7 x weekly -  1-2 sets - 10 reps - Clamshell  - 2-3 x daily - 7 x weekly - 1-2 sets - 10 reps - Seated Hamstring Stretch  - 2-3 x daily - 7 x weekly - 1 sets - 3 reps - 20 hold  ASSESSMENT:  CLINICAL IMPRESSION: Pt presents for treatment today, tolerating HEP well and reports overall pain is improving but does feel like with pregnancy progressing has pain continuing. Pt reports HEP helps when pain happens so she does do them a lot. Pt tolerated activity well today, denied pain or concerns and reports decreased pain at end of session. PT monitored throughout session for pain and technique.  Pt would benefit from additional PT to further address deficits.    OBJECTIVE IMPAIRMENTS: decreased activity tolerance, decreased mobility, difficulty walking, decreased  strength, increased fascial restrictions, increased muscle spasms, impaired flexibility, improper body mechanics, postural dysfunction, and pain.   ACTIVITY LIMITATIONS: carrying, lifting, bending, sitting, standing, squatting, transfers, bed mobility, and locomotion level  PARTICIPATION LIMITATIONS: community activity and occupation  PERSONAL FACTORS: Time since onset of injury/illness/exacerbation and 1 comorbidity: medical history  are also affecting patient's functional outcome.   REHAB POTENTIAL: Good  CLINICAL DECISION MAKING: Evolving/moderate complexity  EVALUATION COMPLEXITY: Moderate   GOALS: Goals reviewed with patient? Yes  Keepers TERM GOALS: Target date: 08/05/23  Pt to be I with HEP.  Baseline: Goal status: MET  2.  Pt will report no more than 7/10 pain with 30 mins in one position or with one task for improved tolerance to activity with work. Baseline: 5-6/10 max (09/09/23) Goal status: MET  3.  Pt to be I with breathing mechanics and relaxation technique to help decreased pain.  Baseline: stretching and breathing to control pain (07/25/23) Goal status: MET   LONG TERM GOALS: Target date: 11/05/23  Pt to be I with advanced HEP.  Baseline:  Goal status: INITIAL  2.  Pt will report no more than 4/10 pain with 30 mins in one position or with one task for improved tolerance to activity with work. Baseline: 5-6/10 (09/09/23) Goal status: In progress   3.  Pt to demonstrate improved trunk mobility in all directions without pain for improved posture and decreased compensatory strategies with walking and standing.   Baseline:  Goal status: INITIAL  4.  Pt to demonstrate at least 5/5 bil hip strength for improved pelvic stability and functional squats with childcare.  Baseline:  Goal status: INITIAL  5.  Pt to be I with taping at abdomen if needed/helpful for pain management at home.  Baseline: responds well to tape and PT instructed on application (09/09/23) Goal  status: In progress     PLAN:  PT FREQUENCY: 2x/week  PT DURATION:  16 sessions  PLANNED INTERVENTIONS: 97110-Therapeutic exercises, 97530- Therapeutic activity, 97112- Neuromuscular re-education, 97535- Self Care, [id number]- Manual therapy, [phone]- Aquatic Therapy, Patient/Family education, Taping, Dry Needling, Scar mobilization, Cryotherapy, Moist heat, and Biofeedback  PLAN FOR NEXT SESSION: taping at abdomen, posture strengthening, stretching hips and core gently, hips and transverse abdominis strengthening  Darryle Navy, PT, DPT 02/05/252:41 PM  PHYSICAL THERAPY DISCHARGE SUMMARY  Visits from Start of Care: 6  Current functional level related to goals / functional outcomes: See above for most current PT status.  Pt didn't return.     Remaining deficits: See above.   Education / Equipment: HEP   Patient agrees to discharge. Patient goals were partially met. Patient is being discharged due to not returning since the last visit.  Burnard  Sophie, PT 11/04/23 10:31 AM   [facility] [address] [place], [place] [address] Phone # [phone] Fax [phone]

## 2023-09-26 ENCOUNTER — Encounter: Payer: Self-pay | Admitting: *Deleted

## 2023-10-01 ENCOUNTER — Other Ambulatory Visit: Payer: Self-pay

## 2023-10-01 ENCOUNTER — Ambulatory Visit: Payer: Medicaid Other | Attending: Obstetrics and Gynecology

## 2023-10-01 DIAGNOSIS — E669 Obesity, unspecified: Secondary | ICD-10-CM

## 2023-10-01 DIAGNOSIS — O34219 Maternal care for unspecified type scar from previous cesarean delivery: Secondary | ICD-10-CM | POA: Diagnosis not present

## 2023-10-01 DIAGNOSIS — O99213 Obesity complicating pregnancy, third trimester: Secondary | ICD-10-CM | POA: Diagnosis not present

## 2023-10-01 DIAGNOSIS — O99323 Drug use complicating pregnancy, third trimester: Secondary | ICD-10-CM | POA: Diagnosis not present

## 2023-10-01 DIAGNOSIS — F1290 Cannabis use, unspecified, uncomplicated: Secondary | ICD-10-CM

## 2023-10-01 DIAGNOSIS — O99013 Anemia complicating pregnancy, third trimester: Secondary | ICD-10-CM

## 2023-10-01 DIAGNOSIS — O09293 Supervision of pregnancy with other poor reproductive or obstetric history, third trimester: Secondary | ICD-10-CM | POA: Diagnosis not present

## 2023-10-01 DIAGNOSIS — Z3A34 34 weeks gestation of pregnancy: Secondary | ICD-10-CM | POA: Diagnosis not present

## 2023-10-01 DIAGNOSIS — D582 Other hemoglobinopathies: Secondary | ICD-10-CM

## 2023-10-02 ENCOUNTER — Ambulatory Visit: Payer: Medicaid Other | Admitting: Family Medicine

## 2023-10-02 ENCOUNTER — Encounter: Payer: Self-pay | Admitting: Family Medicine

## 2023-10-02 VITALS — BP 117/70 | HR 98 | Wt 276.0 lb

## 2023-10-02 DIAGNOSIS — Z3A35 35 weeks gestation of pregnancy: Secondary | ICD-10-CM

## 2023-10-02 DIAGNOSIS — O34219 Maternal care for unspecified type scar from previous cesarean delivery: Secondary | ICD-10-CM

## 2023-10-02 DIAGNOSIS — Z8759 Personal history of other complications of pregnancy, childbirth and the puerperium: Secondary | ICD-10-CM

## 2023-10-02 DIAGNOSIS — O0990 Supervision of high risk pregnancy, unspecified, unspecified trimester: Secondary | ICD-10-CM

## 2023-10-02 DIAGNOSIS — B009 Herpesviral infection, unspecified: Secondary | ICD-10-CM | POA: Diagnosis not present

## 2023-10-02 DIAGNOSIS — O99213 Obesity complicating pregnancy, third trimester: Secondary | ICD-10-CM

## 2023-10-02 DIAGNOSIS — O0993 Supervision of high risk pregnancy, unspecified, third trimester: Secondary | ICD-10-CM | POA: Diagnosis not present

## 2023-10-02 MED ORDER — VALACYCLOVIR HCL 500 MG PO TABS: 500.0000 mg | ORAL_TABLET | Freq: Two times a day (BID) | ORAL | 6 refills | Status: DC

## 2023-10-02 NOTE — Progress Notes (Signed)
   PRENATAL VISIT NOTE  Subjective:  Chelsea Baldwin is a 33 y.o. G4P3003 at [redacted]w[redacted]d being seen today for ongoing prenatal care.  She is currently monitored for the following issues for this high-risk pregnancy and has Anemia; Mild tetrahydrocannabinol (THC) abuse; Hemoglobin C trait (HCC); History of gestational hypertension; Supervision of high risk pregnancy, antepartum; HSV (herpes simplex virus) infection; Previous cesarean delivery affecting pregnancy, antepartum; and Obesity affecting pregnancy on their problem list.  Patient reports no complaints.  Contractions: Not present. Vag. Bleeding: None.  Movement: Present. Denies leaking of fluid.   The following portions of the patient's history were reviewed and updated as appropriate: allergies, current medications, past family history, past medical history, past social history, past surgical history and problem list.   Objective:   Vitals:   10/02/23 1017  BP: 117/70  Pulse: 98  Weight: 276 lb (125.2 kg)    Fetal Status: Fetal Heart Rate (bpm): 135 Fundal Height: 36 cm Movement: Present  Presentation: Vertex  General:  Alert, oriented and cooperative. Patient is in no acute distress.  Skin: Skin is warm and dry. No rash noted.   Cardiovascular: Normal heart rate noted  Respiratory: Normal respiratory effort, no problems with respiration noted  Abdomen: Soft, gravid, appropriate for gestational age.  Pain/Pressure: Absent     Pelvic: Cervical exam deferred        Extremities: Normal range of motion.  Edema: None  Mental Status: Normal mood and affect. Normal behavior. Normal judgment and thought content.   Assessment and Plan:  Pregnancy: G4P3003 at [redacted]w[redacted]d 1. [redacted] weeks gestation of pregnancy (Primary)  2. Previous cesarean delivery affecting pregnancy, antepartum Desires TOLAC  3. Supervision of high risk pregnancy, antepartum Up to date FH appropriate Vigorous movement Vtx on leopolds Planning BTL and wants filshie clips-  discussed that BTL will need to be 6 wk pp laparoscopic unless CS is performed. Voiced understandning No questions today  4. HSV (herpes simplex virus) infection PPX sent today  5. History of gestational hypertension BP WNL  6. Obesity affecting pregnancy in third trimester, unspecified obesity type TWG=14 lb (6.35 kg) which is within goal  Preterm labor symptoms and general obstetric precautions including but not limited to vaginal bleeding, contractions, leaking of fluid and fetal movement were reviewed in detail with the patient. Please refer to After Visit Summary for other counseling recommendations.   Return in about 1 week (around 10/09/2023) for Routine prenatal care.  Future Appointments  Date Time Provider Department Center  10/03/2023 10:15 AM Barbaraann Faster, PT [facility] None  10/09/2023 10:15 AM [place] Bing, MD [facility]  10/10/2023  1:20 PM [facility] US1 [facility]  10/16/2023  8:55 AM [facility] US2 [facility]  10/16/2023 10:15 AM [place] Bing, MD [facility]    Federico Flake, MD

## 2023-10-03 ENCOUNTER — Ambulatory Visit: Payer: Medicaid Other | Admitting: Physical Therapy

## 2023-10-09 ENCOUNTER — Encounter: Payer: Medicaid Other | Admitting: Family Medicine

## 2023-10-09 ENCOUNTER — Other Ambulatory Visit: Payer: Medicaid Other

## 2023-10-09 ENCOUNTER — Other Ambulatory Visit: Payer: Self-pay

## 2023-10-09 ENCOUNTER — Encounter: Payer: Medicaid Other | Admitting: Obstetrics and Gynecology

## 2023-10-09 DIAGNOSIS — Z3A36 36 weeks gestation of pregnancy: Secondary | ICD-10-CM

## 2023-10-09 DIAGNOSIS — O99213 Obesity complicating pregnancy, third trimester: Secondary | ICD-10-CM

## 2023-10-10 ENCOUNTER — Other Ambulatory Visit: Payer: Medicaid Other

## 2023-10-16 ENCOUNTER — Encounter: Payer: Self-pay | Admitting: Obstetrics and Gynecology

## 2023-10-16 ENCOUNTER — Ambulatory Visit: Payer: Medicaid Other | Admitting: Obstetrics and Gynecology

## 2023-10-16 ENCOUNTER — Other Ambulatory Visit (HOSPITAL_COMMUNITY)
Admission: RE | Admit: 2023-10-16 | Discharge: 2023-10-16 | Disposition: A | Discharge status: Home / Self Care | Payer: Medicaid Other | Source: Ambulatory Visit | Attending: Obstetrics and Gynecology | Admitting: Obstetrics and Gynecology

## 2023-10-16 ENCOUNTER — Ambulatory Visit: Payer: Medicaid Other

## 2023-10-16 VITALS — BP 114/75 | HR 83 | Wt 268.3 lb

## 2023-10-16 DIAGNOSIS — D582 Other hemoglobinopathies: Secondary | ICD-10-CM | POA: Diagnosis not present

## 2023-10-16 DIAGNOSIS — D508 Other iron deficiency anemias: Secondary | ICD-10-CM

## 2023-10-16 DIAGNOSIS — Z98891 History of uterine scar from previous surgery: Secondary | ICD-10-CM | POA: Diagnosis not present

## 2023-10-16 DIAGNOSIS — Z3A37 37 weeks gestation of pregnancy: Secondary | ICD-10-CM | POA: Diagnosis not present

## 2023-10-16 DIAGNOSIS — O99213 Obesity complicating pregnancy, third trimester: Secondary | ICD-10-CM | POA: Diagnosis not present

## 2023-10-16 DIAGNOSIS — B009 Herpesviral infection, unspecified: Secondary | ICD-10-CM | POA: Diagnosis not present

## 2023-10-16 DIAGNOSIS — L309 Dermatitis, unspecified: Secondary | ICD-10-CM

## 2023-10-16 DIAGNOSIS — Z6841 Body Mass Index (BMI) 40.0 and over, adult: Secondary | ICD-10-CM | POA: Diagnosis not present

## 2023-10-16 DIAGNOSIS — Z3493 Encounter for supervision of normal pregnancy, unspecified, third trimester: Secondary | ICD-10-CM | POA: Diagnosis not present

## 2023-10-16 DIAGNOSIS — Z8759 Personal history of other complications of pregnancy, childbirth and the puerperium: Secondary | ICD-10-CM | POA: Diagnosis not present

## 2023-10-16 DIAGNOSIS — O0990 Supervision of high risk pregnancy, unspecified, unspecified trimester: Secondary | ICD-10-CM

## 2023-10-16 DIAGNOSIS — O0993 Supervision of high risk pregnancy, unspecified, third trimester: Secondary | ICD-10-CM

## 2023-10-16 LAB — CERVICOVAGINAL ANCILLARY ONLY
Bacterial Vaginitis (gardnerella): NEGATIVE
Candida Glabrata: NEGATIVE
Candida Vaginitis: NEGATIVE
Chlamydia: NEGATIVE
Comment: NEGATIVE
Comment: NEGATIVE
Comment: NEGATIVE
Comment: NEGATIVE
Comment: NEGATIVE
Comment: NORMAL
Neisseria Gonorrhea: NEGATIVE
Trichomonas: NEGATIVE

## 2023-10-16 MED ORDER — HYDROCORTISONE 0.5 % EX OINT: 1.0000 | TOPICAL_OINTMENT | Freq: Two times a day (BID) | CUTANEOUS | 0 refills | Status: AC | PRN

## 2023-10-16 NOTE — Progress Notes (Signed)
 PRENATAL VISIT NOTE  Subjective:  Chelsea Baldwin is a 33 y.o. G4P3003 at [redacted]w[redacted]d being seen today for ongoing prenatal care.  She is currently monitored for the following issues for this high-risk pregnancy and has Anemia; Mild tetrahydrocannabinol (THC) abuse; Hemoglobin C trait (HCC); History of gestational hypertension; Supervision of high risk pregnancy, antepartum; HSV (herpes simplex virus) infection; History of VBAC; Obesity affecting pregnancy; and BMI 45.0-49.9, adult (HCC) on their problem list.  Patient reports  ruq eczema .  Contractions: Irritability. Vag. Bleeding: None.  Movement: Present. Denies leaking of fluid.   The following portions of the patient's history were reviewed and updated as appropriate: allergies, current medications, past family history, past medical history, past social history, past surgical history and problem list.   Objective:   Vitals:   10/16/23 1000  BP: 114/75  Pulse: 83  Weight: 268 lb 5 oz (121.7 kg)    Fetal Status: Fetal Heart Rate (bpm): 140   Movement: Present  Presentation: Vertex  General:  Alert, oriented and cooperative. Patient is in no acute distress.  Skin: Skin is warm and dry. 5cm dark area in ruq belly, nttp  Cardiovascular: Normal heart rate noted  Respiratory: Normal respiratory effort, no problems with respiration noted  Abdomen: Soft, gravid, appropriate for gestational age.  Pain/Pressure: Present     Pelvic: Cervical exam deferred Dilation: Fingertip Effacement (%): Thick Station: Ballotable  Extremities: Normal range of motion.  Edema: Trace  Mental Status: Normal mood and affect. Normal behavior. Normal judgment and thought content.   Assessment and Plan:  Pregnancy: G4P3003 at [redacted]w[redacted]d 1. [redacted] weeks gestation of pregnancy (Primary) Routine care. D/w her re: ppBTL and I do NOT recommend filshie clips due to failure risk; d/w her re: BS and ligation methods. I told her if she really wants filshie, recommend interval  BTL 2-43m after delivery - Culture, beta strep (group b only) - Cervicovaginal ancillary only  2. History of VBAC Consent 1/15 signed  3. BMI 45.0-49.9, adult (HCC) Weight decreased from last visit; patient surprised b/c eating and drinking well Patient missed her bpp earlier today and she's unable to stay for an add on; pt to reschedule at check out. I told her that 39-40wk IOL recommended and can schedule next visit Continue weekly testing 2/11: 69%, 2725g, ac 81%, afi 15, ceph 2/19 (prelim): afi 9, ?8/8, no presentation given   4. Eczema, unspecified type 0.5% hydrocortisone sent in bid prn  5. HSV (herpes simplex virus) infection Hasn't picked up yet. D/w her importance  6. History of gestational hypertension No issues on no meds  7. Obesity affecting pregnancy in third trimester, unspecified obesity type  8. Supervision of high risk pregnancy, antepartum  9. Other iron deficiency anemia    Latest Ref Rng & Units 08/07/2023    8:35 AM 04/18/2023   12:51 PM 10/20/2021   12:02 PM  CBC  WBC 3.4 - 10.8 x10E3/uL 9.8  7.6  7.2   Hemoglobin 11.1 - 15.9 g/dL 9.9  09.8  11.9   Hematocrit 34.0 - 46.6 % 29.4  34.2  36.3   Platelets 150 - 450 x10E3/uL 278  242  327   Continue iron  10. Hemoglobin C trait (HCC)  Preterm labor symptoms and general obstetric precautions including but not limited to vaginal bleeding, contractions, leaking of fluid and fetal movement were reviewed in detail with the patient. Please refer to After Visit Summary for other counseling recommendations.   Return in 1 week (on 10/23/2023)  for in person, in office nst/bpp, high risk ob, md or app.  Future Appointments  Date Time Provider Department Center  10/22/2023  2:35 PM [facility] US2 [facility]  10/22/2023  3:15 PM Milas Hock, MD [facility]    [place] Bing, MD

## 2023-10-18 DIAGNOSIS — F411 Generalized anxiety disorder: Secondary | ICD-10-CM | POA: Diagnosis not present

## 2023-10-19 DIAGNOSIS — F411 Generalized anxiety disorder: Secondary | ICD-10-CM | POA: Diagnosis not present

## 2023-10-21 ENCOUNTER — Other Ambulatory Visit: Payer: Self-pay | Admitting: *Deleted

## 2023-10-21 ENCOUNTER — Encounter: Payer: Self-pay | Admitting: Obstetrics and Gynecology

## 2023-10-21 DIAGNOSIS — O99820 Streptococcus B carrier state complicating pregnancy: Secondary | ICD-10-CM | POA: Insufficient documentation

## 2023-10-21 DIAGNOSIS — O99213 Obesity complicating pregnancy, third trimester: Secondary | ICD-10-CM

## 2023-10-21 LAB — CULTURE, BETA STREP (GROUP B ONLY): Strep Gp B Culture: POSITIVE — AB

## 2023-10-22 ENCOUNTER — Ambulatory Visit: Payer: Self-pay | Admitting: Obstetrics and Gynecology

## 2023-10-22 ENCOUNTER — Other Ambulatory Visit: Payer: Self-pay

## 2023-10-22 ENCOUNTER — Ambulatory Visit (INDEPENDENT_AMBULATORY_CARE_PROVIDER_SITE_OTHER): Payer: Medicaid Other

## 2023-10-22 VITALS — BP 120/79 | HR 91 | Wt 271.0 lb

## 2023-10-22 DIAGNOSIS — O0990 Supervision of high risk pregnancy, unspecified, unspecified trimester: Secondary | ICD-10-CM

## 2023-10-22 DIAGNOSIS — B009 Herpesviral infection, unspecified: Secondary | ICD-10-CM | POA: Diagnosis not present

## 2023-10-22 DIAGNOSIS — Z3A37 37 weeks gestation of pregnancy: Secondary | ICD-10-CM

## 2023-10-22 DIAGNOSIS — Z98891 History of uterine scar from previous surgery: Secondary | ICD-10-CM

## 2023-10-22 DIAGNOSIS — Z3009 Encounter for other general counseling and advice on contraception: Secondary | ICD-10-CM

## 2023-10-22 DIAGNOSIS — O0993 Supervision of high risk pregnancy, unspecified, third trimester: Secondary | ICD-10-CM

## 2023-10-22 DIAGNOSIS — O99213 Obesity complicating pregnancy, third trimester: Secondary | ICD-10-CM

## 2023-10-22 DIAGNOSIS — Z8759 Personal history of other complications of pregnancy, childbirth and the puerperium: Secondary | ICD-10-CM | POA: Diagnosis not present

## 2023-10-22 DIAGNOSIS — O99820 Streptococcus B carrier state complicating pregnancy: Secondary | ICD-10-CM

## 2023-10-22 DIAGNOSIS — D508 Other iron deficiency anemias: Secondary | ICD-10-CM | POA: Diagnosis not present

## 2023-10-22 NOTE — Progress Notes (Signed)
   PRENATAL VISIT NOTE  Subjective:  Chelsea Baldwin is a 33 y.o. G4P3003 at [redacted]w[redacted]d being seen today for ongoing prenatal care.  She is currently monitored for the following issues for this high-risk pregnancy and has Anemia; Hemoglobin C trait (HCC); History of gestational hypertension; Supervision of high risk pregnancy, antepartum; HSV (herpes simplex virus) infection; History of VBAC; Obesity affecting pregnancy; BMI 45.0-49.9, adult (HCC); GBS (group B Streptococcus carrier), +RV culture, currently pregnant; and Unwanted fertility on their problem list.  Patient reports no complaints.  Contractions: Not present. Vag. Bleeding: None.  Movement: Present. Denies leaking of fluid.   The following portions of the patient's history were reviewed and updated as appropriate: allergies, current medications, past family history, past medical history, past social history, past surgical history and problem list.   Objective:   Vitals:   10/22/23 1525  BP: 120/79  Pulse: 91  Weight: 271 lb (122.9 kg)    Fetal Status: Fetal Heart Rate (bpm): 135   Movement: Present     General:  Alert, oriented and cooperative. Patient is in no acute distress.  Skin: Skin is warm and dry. No rash noted.   Cardiovascular: Normal heart rate noted  Respiratory: Normal respiratory effort, no problems with respiration noted  Abdomen: Soft, gravid, appropriate for gestational age.  Pain/Pressure: Absent     Pelvic: Cervical exam deferred        Extremities: Normal range of motion.  Edema: None  Mental Status: Normal mood and affect. Normal behavior. Normal judgment and thought content.   Assessment and Plan:  Pregnancy: G4P3003 at [redacted]w[redacted]d 1. Supervision of high risk pregnancy, antepartum (Primary) Discussed IOL at 39-40 wk. She requests 3/14-15. Schedule request sent.   Discussed method sterilization at length. She also had discussed this with Dr. Vergie Living. After reviewing filshie vs salpingectomy again today, she  would prefer salpingectomy. Discussed PP vs interval and will depend based on surgeon's assessment of fundus following delivery. Reviewed likely would be interval.   2. Obesity affecting pregnancy in third trimester, unspecified obesity type BPP done today. Report pending.   3. HSV (herpes simplex virus) infection Taking valtrex  4. History of VBAC Desires TOLAC.   5. History of gestational hypertension BP is 120/79  6. GBS (group B Streptococcus carrier), +RV culture, currently pregnant GBS pos > PCN In labor reviewed.   7. Other iron deficiency anemia PO iron.   8. Pregnancy with 37 weeks completed gestation   Term labor symptoms and general obstetric precautions including but not limited to vaginal bleeding, contractions, leaking of fluid and fetal movement were reviewed in detail with the patient. Please refer to After Visit Summary for other counseling recommendations.   Return in about 1 week (around 10/29/2023) for OB VISIT, MD or APP.  No future appointments.   Milas Hock, MD

## 2023-10-22 NOTE — Patient Instructions (Signed)
Congratulations, you're on your way to having your baby!!!   You now have an induction scheduled.   If your induction is scheduled for the daytime, you will see an appointment time in MyChart. Please DO NOT show up at this time, this is just a placeholder on the schedule. You will get a call when your room is ready and will have 2 hours to arrive. The hospital staff can call anytime starting at 5 am through the rest of the day.   You will also get a call from the pre-admission nurse to go over pre-admission screen about 2 days prior to your induction date.      

## 2023-10-23 ENCOUNTER — Telehealth (HOSPITAL_COMMUNITY): Payer: Self-pay | Admitting: *Deleted

## 2023-10-23 ENCOUNTER — Encounter (HOSPITAL_COMMUNITY): Payer: Self-pay | Admitting: *Deleted

## 2023-10-23 NOTE — Telephone Encounter (Signed)
 Preadmission screen

## 2023-10-24 DIAGNOSIS — F411 Generalized anxiety disorder: Secondary | ICD-10-CM | POA: Diagnosis not present

## 2023-10-26 DIAGNOSIS — F411 Generalized anxiety disorder: Secondary | ICD-10-CM | POA: Diagnosis not present

## 2023-10-29 ENCOUNTER — Other Ambulatory Visit: Payer: Self-pay

## 2023-10-29 ENCOUNTER — Ambulatory Visit: Admitting: Obstetrics and Gynecology

## 2023-10-29 VITALS — BP 128/78 | HR 90 | Wt 279.0 lb

## 2023-10-29 DIAGNOSIS — O0990 Supervision of high risk pregnancy, unspecified, unspecified trimester: Secondary | ICD-10-CM

## 2023-10-29 DIAGNOSIS — O0993 Supervision of high risk pregnancy, unspecified, third trimester: Secondary | ICD-10-CM | POA: Diagnosis not present

## 2023-10-29 DIAGNOSIS — Z8759 Personal history of other complications of pregnancy, childbirth and the puerperium: Secondary | ICD-10-CM | POA: Diagnosis not present

## 2023-10-29 DIAGNOSIS — Z3A38 38 weeks gestation of pregnancy: Secondary | ICD-10-CM | POA: Diagnosis not present

## 2023-10-29 DIAGNOSIS — Z98891 History of uterine scar from previous surgery: Secondary | ICD-10-CM | POA: Diagnosis not present

## 2023-10-29 DIAGNOSIS — B009 Herpesviral infection, unspecified: Secondary | ICD-10-CM

## 2023-10-29 DIAGNOSIS — O99213 Obesity complicating pregnancy, third trimester: Secondary | ICD-10-CM | POA: Diagnosis not present

## 2023-10-29 DIAGNOSIS — O99820 Streptococcus B carrier state complicating pregnancy: Secondary | ICD-10-CM | POA: Diagnosis not present

## 2023-10-29 NOTE — Progress Notes (Signed)
   PRENATAL VISIT NOTE  Subjective:  Chelsea Baldwin is a 33 y.o. G4P3003 at [redacted]w[redacted]d being seen today for ongoing prenatal care.  She is currently monitored for the following issues for this low-risk pregnancy and has Anemia; Hemoglobin C trait (HCC); History of gestational hypertension; Supervision of high risk pregnancy, antepartum; HSV (herpes simplex virus) infection; History of VBAC; Obesity affecting pregnancy; BMI 45.0-49.9, adult (HCC); GBS (group B Streptococcus carrier), +RV culture, currently pregnant; and Unwanted fertility on their problem list.  Patient reports no complaints.  Contractions: Not present. Vag. Bleeding: None.  Movement: Present. Denies leaking of fluid.   The following portions of the patient's history were reviewed and updated as appropriate: allergies, current medications, past family history, past medical history, past social history, past surgical history and problem list.   Objective:   Vitals:   10/29/23 1423  BP: 128/78  Pulse: 90  Weight: 279 lb (126.6 kg)    Fetal Status: Fetal Heart Rate (bpm): 140   Movement: Present  Presentation: Vertex  General:  Alert, oriented and cooperative. Patient is in no acute distress.  Skin: Skin is warm and dry. No rash noted.   Cardiovascular: Normal heart rate noted  Respiratory: Normal respiratory effort, no problems with respiration noted  Abdomen: Soft, gravid, appropriate for gestational age.  Pain/Pressure: Absent     Pelvic: Cervical exam deferred Dilation: Fingertip Effacement (%): Thick    Extremities: Normal range of motion.  Edema: None  Mental Status: Normal mood and affect. Normal behavior. Normal judgment and thought content.   Assessment and Plan:  Pregnancy: G4P3003 at [redacted]w[redacted]d 1. Supervision of high risk pregnancy, antepartum (Primary) BP and FHR normal Doing well, feeling regular movement    2. [redacted] weeks gestation of pregnancy IOL 3/15, orders placed  3. Obesity affecting pregnancy in third  trimester, unspecified obesity type BPP 3/4 report pending   4. History of VBAC Desires TOLAC, consent signed 1/15  5. HSV (herpes simplex virus) infection On valtrex   6. GBS (group B Streptococcus carrier), +RV culture, currently pregnant Tx in labor  7. History of gestational hypertension Normotensive     Term labor symptoms and general obstetric precautions including but not limited to vaginal bleeding, contractions, leaking of fluid and fetal movement were reviewed in detail with the patient. Please refer to After Visit Summary for other counseling recommendations.   No follow-ups on file.  Future Appointments  Date Time Provider Department Center  11/02/2023  7:15 AM [facility] SCHED ROOM [facility] None  12/11/2023  2:35 PM Adam Phenix, MD [facility]    Albertine Grates, FNP

## 2023-11-02 ENCOUNTER — Inpatient Hospital Stay (HOSPITAL_COMMUNITY)

## 2023-11-02 ENCOUNTER — Inpatient Hospital Stay (HOSPITAL_COMMUNITY)
Admission: RE | Admit: 2023-11-02 | Discharge: 2023-11-06 | DRG: 784 | Disposition: A | Discharge status: Home / Self Care | Attending: Obstetrics and Gynecology | Admitting: Obstetrics and Gynecology

## 2023-11-02 ENCOUNTER — Encounter (HOSPITAL_COMMUNITY): Payer: Self-pay | Admitting: Family Medicine

## 2023-11-02 ENCOUNTER — Other Ambulatory Visit: Payer: Self-pay

## 2023-11-02 DIAGNOSIS — Z302 Encounter for sterilization: Secondary | ICD-10-CM

## 2023-11-02 DIAGNOSIS — O9832 Other infections with a predominantly sexual mode of transmission complicating childbirth: Secondary | ICD-10-CM | POA: Diagnosis not present

## 2023-11-02 DIAGNOSIS — O43893 Other placental disorders, third trimester: Secondary | ICD-10-CM | POA: Diagnosis not present

## 2023-11-02 DIAGNOSIS — Z6841 Body Mass Index (BMI) 40.0 and over, adult: Secondary | ICD-10-CM

## 2023-11-02 DIAGNOSIS — Z87891 Personal history of nicotine dependence: Secondary | ICD-10-CM | POA: Diagnosis not present

## 2023-11-02 DIAGNOSIS — O134 Gestational [pregnancy-induced] hypertension without significant proteinuria, complicating childbirth: Principal | ICD-10-CM | POA: Diagnosis present

## 2023-11-02 DIAGNOSIS — O99820 Streptococcus B carrier state complicating pregnancy: Secondary | ICD-10-CM | POA: Diagnosis not present

## 2023-11-02 DIAGNOSIS — Z8249 Family history of ischemic heart disease and other diseases of the circulatory system: Secondary | ICD-10-CM

## 2023-11-02 DIAGNOSIS — Z9851 Tubal ligation status: Secondary | ICD-10-CM

## 2023-11-02 DIAGNOSIS — A6000 Herpesviral infection of urogenital system, unspecified: Secondary | ICD-10-CM | POA: Diagnosis not present

## 2023-11-02 DIAGNOSIS — D62 Acute posthemorrhagic anemia: Secondary | ICD-10-CM | POA: Diagnosis not present

## 2023-11-02 DIAGNOSIS — O34211 Maternal care for low transverse scar from previous cesarean delivery: Secondary | ICD-10-CM | POA: Diagnosis not present

## 2023-11-02 DIAGNOSIS — O99824 Streptococcus B carrier state complicating childbirth: Secondary | ICD-10-CM | POA: Diagnosis present

## 2023-11-02 DIAGNOSIS — O9081 Anemia of the puerperium: Secondary | ICD-10-CM | POA: Diagnosis not present

## 2023-11-02 DIAGNOSIS — O99214 Obesity complicating childbirth: Secondary | ICD-10-CM | POA: Diagnosis not present

## 2023-11-02 DIAGNOSIS — O99210 Obesity complicating pregnancy, unspecified trimester: Secondary | ICD-10-CM | POA: Diagnosis present

## 2023-11-02 DIAGNOSIS — D72829 Elevated white blood cell count, unspecified: Secondary | ICD-10-CM | POA: Insufficient documentation

## 2023-11-02 DIAGNOSIS — Z3009 Encounter for other general counseling and advice on contraception: Secondary | ICD-10-CM | POA: Diagnosis present

## 2023-11-02 DIAGNOSIS — Z98891 History of uterine scar from previous surgery: Principal | ICD-10-CM

## 2023-11-02 DIAGNOSIS — Z3A39 39 weeks gestation of pregnancy: Secondary | ICD-10-CM

## 2023-11-02 DIAGNOSIS — D509 Iron deficiency anemia, unspecified: Secondary | ICD-10-CM | POA: Diagnosis present

## 2023-11-02 DIAGNOSIS — O26893 Other specified pregnancy related conditions, third trimester: Secondary | ICD-10-CM | POA: Diagnosis not present

## 2023-11-02 DIAGNOSIS — O0990 Supervision of high risk pregnancy, unspecified, unspecified trimester: Principal | ICD-10-CM

## 2023-11-02 LAB — CBC
HCT: 28.5 % — ABNORMAL LOW (ref 36.0–46.0)
Hemoglobin: 9.9 g/dL — ABNORMAL LOW (ref 12.0–15.0)
MCH: 26.6 pg (ref 26.0–34.0)
MCHC: 34.7 g/dL (ref 30.0–36.0)
MCV: 76.6 fL — ABNORMAL LOW (ref 80.0–100.0)
Platelets: 292 10*3/uL (ref 150–400)
RBC: 3.72 MIL/uL — ABNORMAL LOW (ref 3.87–5.11)
RDW: 14.4 % (ref 11.5–15.5)
WBC: 9.3 10*3/uL (ref 4.0–10.5)
nRBC: 0 % (ref 0.0–0.2)

## 2023-11-02 LAB — COMPREHENSIVE METABOLIC PANEL
ALT: 11 U/L (ref 0–44)
AST: 17 U/L (ref 15–41)
Albumin: 2.4 g/dL — ABNORMAL LOW (ref 3.5–5.0)
Alkaline Phosphatase: 72 U/L (ref 38–126)
Anion gap: 10 (ref 5–15)
BUN: 7 mg/dL (ref 6–20)
CO2: 19 mmol/L — ABNORMAL LOW (ref 22–32)
Calcium: 8.6 mg/dL — ABNORMAL LOW (ref 8.9–10.3)
Chloride: 107 mmol/L (ref 98–111)
Creatinine, Ser: 0.7 mg/dL (ref 0.44–1.00)
GFR, Estimated: >60 mL/min (ref >60)
Glucose, Bld: 84 mg/dL (ref 70–99)
Potassium: 4 mmol/L (ref 3.5–5.1)
Sodium: 136 mmol/L (ref 135–145)
Total Bilirubin: 0.4 mg/dL (ref 0.0–1.2)
Total Protein: 6.2 g/dL — ABNORMAL LOW (ref 6.5–8.1)

## 2023-11-02 LAB — TYPE AND SCREEN
ABO/RH(D): O POS
Antibody Screen: NEGATIVE

## 2023-11-02 MED ORDER — SODIUM CHLORIDE 0.9 % IV SOLN
5.0000 10*6.[IU] | Freq: Once | INTRAVENOUS | Status: AC
  Administered 2023-11-02: 5 10*6.[IU] via INTRAVENOUS
  Filled 2023-11-02: qty 5

## 2023-11-02 MED ORDER — OXYTOCIN BOLUS FROM INFUSION
333.0000 mL | Freq: Once | INTRAVENOUS | Status: DC
Start: 2023-11-02 — End: 2023-11-03

## 2023-11-02 MED ORDER — TERBUTALINE SULFATE 1 MG/ML IJ SOLN: 0.2500 mg | Freq: Once | INTRAMUSCULAR | Status: DC | PRN

## 2023-11-02 MED ORDER — SOD CITRATE-CITRIC ACID 500-334 MG/5ML PO SOLN
30.0000 mL | ORAL | Status: DC | PRN
  Administered 2023-11-02: 30 mL via ORAL
  Filled 2023-11-02 (×2): qty 30

## 2023-11-02 MED ORDER — LACTATED RINGERS IV SOLN
500.0000 mL | INTRAVENOUS | Status: DC | PRN
Start: 2023-11-02 — End: 2023-11-03
  Administered 2023-11-03 (×2): 500 mL via INTRAVENOUS

## 2023-11-02 MED ORDER — OXYTOCIN-SODIUM CHLORIDE 30-0.9 UT/500ML-% IV SOLN: 2.5000 [IU]/h | INTRAVENOUS | Status: DC

## 2023-11-02 MED ORDER — ONDANSETRON HCL 4 MG/2ML IJ SOLN
4.0000 mg | Freq: Four times a day (QID) | INTRAMUSCULAR | Status: DC | PRN
  Administered 2023-11-02: 4 mg via INTRAVENOUS
  Filled 2023-11-02: qty 2

## 2023-11-02 MED ORDER — LIDOCAINE HCL (PF) 1 % IJ SOLN
30.0000 mL | INTRAMUSCULAR | Status: DC | PRN
Start: 2023-11-02 — End: 2023-11-03

## 2023-11-02 MED ORDER — ACETAMINOPHEN 325 MG PO TABS: 650.0000 mg | ORAL_TABLET | ORAL | Status: DC | PRN

## 2023-11-02 MED ORDER — TERBUTALINE SULFATE 1 MG/ML IJ SOLN
0.2500 mg | Freq: Once | INTRAMUSCULAR | Status: AC | PRN
  Administered 2023-11-03: 0.25 mg via SUBCUTANEOUS
  Filled 2023-11-02: qty 1

## 2023-11-02 MED ORDER — PENICILLIN G POT IN DEXTROSE 60000 UNIT/ML IV SOLN
3.0000 10*6.[IU] | INTRAVENOUS | Status: DC
  Administered 2023-11-02 – 2023-11-03 (×3): 3 10*6.[IU] via INTRAVENOUS
  Filled 2023-11-02 (×3): qty 50

## 2023-11-02 MED ORDER — LACTATED RINGERS IV SOLN: INTRAVENOUS | Status: DC

## 2023-11-02 MED ORDER — OXYTOCIN-SODIUM CHLORIDE 30-0.9 UT/500ML-% IV SOLN
1.0000 m[IU]/min | INTRAVENOUS | Status: DC
Start: 2023-11-02 — End: 2023-11-02

## 2023-11-02 MED ORDER — OXYTOCIN-SODIUM CHLORIDE 30-0.9 UT/500ML-% IV SOLN
1.0000 m[IU]/min | INTRAVENOUS | Status: DC
  Administered 2023-11-02: 2 m[IU]/min via INTRAVENOUS
  Filled 2023-11-02: qty 500

## 2023-11-02 NOTE — H&P (Addendum)
 OBSTETRIC ADMISSION HISTORY AND PHYSICAL  Chelsea Baldwin is a 33 y.o. female [id number] with IUP at [redacted]w[redacted]d by LMP presenting for IOL. She reports +FMs, No LOF, no VB, no blurry vision, headaches or peripheral edema, and RUQ pain.  She plans on breast feeding. She request BTS for birth control. She received her prenatal care at [facility]   Dating: By LMP --->  Estimated Date of Delivery: 11/06/23  Sono:    @[redacted]w[redacted]d , CWD, normal anatomy, cephalic presentation, 2725g, 44% EFW   Prenatal History/Complications:  #GHTN #Anemia in pregnancy #Hemoglobin C trait #Hx VBAC #Obesity #GBS positive  #HSV infection on valtrex   Past Medical History: Past Medical History:  Diagnosis Date   Anemia of pregnancy 06/02/2018   Arm numbness left 08/25/2019   COVID-19 vaccine administered 02/08/2021   Encounter for administration of COVID-19 vaccine 02/07/2021   HSV (herpes simplex virus) infection 04/10/2023   Low back pain 08/25/2019   Previous cesarean delivery, antepartum 01/01/2020   Desires TOLAC, signed TOLAC consent 01/01/20     Screening for cervical cancer 10/12/2020   VBAC, delivered     Past Surgical History: Past Surgical History:  Procedure Laterality Date   CESAREAN SECTION  2017   G2    TONSILLECTOMY      Obstetrical History: OB History     Gravida  4   Para  3   Term  3   Preterm  0   AB  0   Living  3      SAB      IAB      Ectopic      Multiple  0   Live Births  3        Obstetric Comments  G2- Emergency due to bradycardia, 2017, delivered at [facility] -- 6 pounds 3 ounces  G1- Boy, 2012, 6 pounds 1 ounce - Severe anemia in G1 pregnancy          Social History Social History   Socioeconomic History   Marital status: Single    Spouse name: Not on file   Number of children: Not on file   Years of education: Not on file   Highest education level: Associate degree: occupational, Scientist, product/process development, or vocational program   Occupational History   Not on file  Tobacco Use   Smoking status: Former    Types: Cigarettes    Passive exposure: Past   Smokeless tobacco: Never  Vaping Use   Vaping status: Never Used  Substance and Sexual Activity   Alcohol use: Not Currently   Drug use: Not Currently    Types: Marijuana   Sexual activity: Yes    Partners: Male    Birth control/protection: None  Other Topics Concern   Not on file  Social History Narrative   Lives with two children (one boy and one girl) and boyfriend Farrel Demark    Social Drivers of Health   Financial Resource Strain: Medium Risk (05/29/2023)   Overall Financial Resource Strain (CARDIA)    Difficulty of Paying Living Expenses: Somewhat hard  Food Insecurity: No Food Insecurity (11/02/2023)   Hunger Vital Sign    Worried About Running Out of Food in the Last Year: Never true    Ran Out of Food in the Last Year: Never true  Transportation Needs: No Transportation Needs (11/02/2023)   PRAPARE - Administrator, Civil Service (Medical): No    Lack of Transportation (Non-Medical): No  Physical Activity: Insufficiently Active (  05/29/2023)   Exercise Vital Sign    Days of Exercise per Week: 3 days    Minutes of Exercise per Session: 40 min  Stress: No Stress Concern Present (05/29/2023)   [employer] of Occupational Health - Occupational Stress Questionnaire    Feeling of Stress : Not at all  Social Connections: Socially Isolated (05/29/2023)   Social Connection and Isolation Panel [NHANES]    Frequency of Communication with Friends and Family: More than three times a week    Frequency of Social Gatherings with Friends and Family: Once a week    Attends Religious Services: Never    Database administrator or Organizations: No    Attends Engineer, structural: Not on file    Marital Status: Divorced    Family History: Family History  Problem Relation Age of Onset   Hypertension Mother    Hypertension Father      Allergies: No Known Allergies  Medications Prior to Admission  Medication Sig Dispense Refill Last Dose/Taking   ferrous sulfate 324 MG TBEC Take 324 mg by mouth.      fluticasone (FLONASE) 50 MCG/ACT nasal spray Place 1 spray into both nostrils daily. 11.1 mL 1    hydrocortisone ointment 0.5 % Apply 1 Application topically 2 (two) times daily as needed for itching. 30 g 0    Prenatal Vit-Fe Fumarate-FA (PRENATAL VITAMINS) 28-0.8 MG TABS Take 1 tablet by mouth daily. 30 tablet 11    valACYclovir (VALTREX) 500 MG tablet Take 1 tablet (500 mg total) by mouth 2 (two) times daily. 60 tablet 6      Review of Systems   All systems reviewed and negative except as stated in HPI  Blood pressure 133/84, pulse 81, resp. rate 18, height 5\' 2"  (1.575 m), weight 126.6 kg, last menstrual period 01/30/2023, currently breastfeeding. General appearance: alert, cooperative, appears stated age, and no distress Lungs: clear to auscultation bilaterally Heart: regular rate and rhythm Abdomen: soft, non-tender; bowel sounds normal Pelvic: Speculum exam completed. No evidence of HSV lesions.  Extremities: Homans sign is negative, no sign of DVT Presentation: cephalic Fetal monitoringBaseline: 130 bpm; moderate variability Uterine activityFrequency: Every 2-4 minutes     Prenatal labs: ABO, Rh: --/--/O POS (03/15 1611) Antibody: NEG (03/15 1611) Rubella: 5.68 (08/29 1251) RPR: Non Reactive (12/18 0835)  HBsAg: Negative (08/29 1251)  HIV: Non Reactive (12/18 0835)  GBS: Positive/-- (02/26 1154)    Lab Results  Component Value Date   GBS Positive (A) 10/16/2023   GTT WNL Genetic screening  Panorama low risk; carrier hemoglobin c  Anatomy US WNL  Immunization History  Administered Date(s) Administered   Influenza, Seasonal, Injecte, Preservative Fre 05/29/2023   Influenza,inj,Quad PF,6+ Mos 06/02/2018, 07/23/2019, 10/10/2020, 08/17/2021   PFIZER Comirnaty(Gray Top)Covid-19 Tri-Sucrose  Vaccine 02/08/2021, 03/03/2021   PPD Test 02/08/2021, 09/13/2021   Tdap 06/02/2018, 12/03/2019, 08/07/2023    Prenatal Transfer Tool  Maternal Diabetes: No Genetic Screening: Abnormal:  Results: Other:carrier hemablobin c  Maternal Ultrasounds/Referrals: Normal Fetal Ultrasounds or other Referrals:  Referred to Materal Fetal Medicine  Maternal Substance Abuse:  No Significant Maternal Medications:  Meds include: Other: valtrex Significant Maternal Lab Results: Group B Strep positive Number of Prenatal Visits:greater than 3 verified prenatal visits Maternal Vaccinations:TDap, Flu, and Covid Other Comments:  None   Results for orders placed or performed during the hospital encounter of 11/02/23 (from the past 24 hours)  CBC   Collection Time: 11/02/23  4:11 PM  Result Value Ref Range  WBC 9.3 4.0 - 10.5 K/uL   RBC 3.72 (L) 3.87 - 5.11 MIL/uL   Hemoglobin 9.9 (L) 12.0 - 15.0 g/dL   HCT 78.4 (L) 69.6 - 29.5 %   MCV 76.6 (L) 80.0 - 100.0 fL   MCH 26.6 26.0 - 34.0 pg   MCHC 34.7 30.0 - 36.0 g/dL   RDW 28.4 13.2 - 44.0 %   Platelets 292 150 - 400 K/uL   nRBC 0.0 0.0 - 0.2 %  Type and screen   Collection Time: 11/02/23  4:11 PM  Result Value Ref Range   ABO/RH(D) O POS    Antibody Screen NEG    Sample Expiration      11/05/2023,2359 Performed at [facility], [address], [place], [place] [address]     Patient Active Problem List   Diagnosis Date Noted   Unwanted fertility 10/22/2023   GBS (group B Streptococcus carrier), +RV culture, currently pregnant 10/21/2023   BMI 45.0-49.9, adult (HCC) 10/16/2023   Obesity affecting pregnancy 06/03/2023   History of VBAC 05/01/2023   Supervision of high risk pregnancy, antepartum 04/10/2023   HSV (herpes simplex virus) infection 04/10/2023   History of gestational hypertension 01/15/2020   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   Anemia 06/02/2018    Assessment/Plan:  Chelsea Baldwin is a 33 y.o. G4P3003 at [redacted]w[redacted]d  here for IOL for GHTN.  #Labor:Progressing well. 1 cm dilated 10% effaced and -3 station. Start pitocin.   #Pain: Well controlled. Epidural as appropriate. #FWB: Cat 1  #GBS status:  positive #Feeding: Breastmilk  #Reproductive Life planning: Tubal Ligation #Circ:  not applicable  Denton Ar, MD  11/02/2023, 6:13 PM  Evaluation and management procedures were performed by the [facility] Medicine Resident under my supervision. I was immediately available for direct supervision, assistance and direction throughout this encounter.  I also confirm that I have verified the information documented in the resident's note, and that I have also personally reperformed the pertinent components of the physical exam and all of the medical decision making activities.  I have also made any necessary editorial changes.   Mittie Bodo, MD Family Medicine - Obstetrics Fellow      Attestation of Attending Supervision of OB Fellow: Evaluation, management, and procedures were performed by the [facility] Fellow under my supervision and collaboration.  I have reviewed the OB Fellow's note and chart, and I agree with the management and plan.  Baldemar Lenis, MD, [facility] Attending Center for [employer] (Faculty Practice)  11/02/2023 8:21 PM

## 2023-11-02 NOTE — Progress Notes (Signed)
 Labor Progress Note Chelsea Baldwin is a 33 y.o. Q0H4742 at [redacted]w[redacted]d presented for IOL for gHTN S: Starting to feel contractions, still very irregular.  O:  BP 131/81   Pulse 80   Temp 98.1 F (36.7 C) (Oral)   Resp 16   Ht 5\' 2"  (1.575 m)   Wt 126.6 kg   LMP 01/30/2023   BMI 51.03 kg/m   EFM: baseline 135, no accels, no decels, moderate variability TOCO: q3-33min contractions  CVE: Dilation: 1 Effacement (%): 50 Cervical Position: Posterior Station: -3 Presentation: Vertex Exam by:: dr Judd Lien   A&P: 33 y.o. [id number] [redacted]w[redacted]d admitted for IOL #Labor: Plan for check after 4 hours of pitocin to assess for FB vs AROM. #Pain: Mild #FWB: Cat I #GBS positive; PCN  #GHTN: mild range BP, asymptomatic; Labs: Plt 292, CMP pending  Wyn Forster, MD 10:33 PM

## 2023-11-03 ENCOUNTER — Encounter (HOSPITAL_COMMUNITY)
Admission: RE | Disposition: A | Discharge status: Home / Self Care | Payer: Self-pay | Source: Home / Self Care | Attending: Obstetrics and Gynecology

## 2023-11-03 ENCOUNTER — Inpatient Hospital Stay (HOSPITAL_COMMUNITY): Admitting: Certified Registered Nurse Anesthetist

## 2023-11-03 ENCOUNTER — Encounter (HOSPITAL_COMMUNITY): Payer: Self-pay | Admitting: Family Medicine

## 2023-11-03 ENCOUNTER — Other Ambulatory Visit: Payer: Self-pay

## 2023-11-03 DIAGNOSIS — O9832 Other infections with a predominantly sexual mode of transmission complicating childbirth: Secondary | ICD-10-CM | POA: Diagnosis not present

## 2023-11-03 DIAGNOSIS — Z3A39 39 weeks gestation of pregnancy: Secondary | ICD-10-CM

## 2023-11-03 DIAGNOSIS — O99214 Obesity complicating childbirth: Secondary | ICD-10-CM | POA: Diagnosis not present

## 2023-11-03 DIAGNOSIS — Z9851 Tubal ligation status: Secondary | ICD-10-CM

## 2023-11-03 DIAGNOSIS — O99820 Streptococcus B carrier state complicating pregnancy: Secondary | ICD-10-CM | POA: Diagnosis not present

## 2023-11-03 DIAGNOSIS — O43893 Other placental disorders, third trimester: Secondary | ICD-10-CM | POA: Diagnosis not present

## 2023-11-03 DIAGNOSIS — O34211 Maternal care for low transverse scar from previous cesarean delivery: Secondary | ICD-10-CM | POA: Diagnosis not present

## 2023-11-03 DIAGNOSIS — Z302 Encounter for sterilization: Secondary | ICD-10-CM | POA: Diagnosis not present

## 2023-11-03 DIAGNOSIS — O134 Gestational [pregnancy-induced] hypertension without significant proteinuria, complicating childbirth: Secondary | ICD-10-CM | POA: Diagnosis not present

## 2023-11-03 LAB — CREATININE, SERUM
Creatinine, Ser: 0.8 mg/dL (ref 0.44–1.00)
GFR, Estimated: >60 mL/min (ref >60)

## 2023-11-03 LAB — RPR: RPR Ser Ql: NONREACTIVE

## 2023-11-03 SURGERY — Surgical Case
Anesthesia: General | Site: Abdomen

## 2023-11-03 MED ORDER — PHENYLEPHRINE HCL-NACL 20-0.9 MG/250ML-% IV SOLN
INTRAVENOUS | Status: AC
  Filled 2023-11-03: qty 250

## 2023-11-03 MED ORDER — KETOROLAC TROMETHAMINE 30 MG/ML IJ SOLN
30.0000 mg | Freq: Four times a day (QID) | INTRAMUSCULAR | Status: AC
  Administered 2023-11-03 – 2023-11-04 (×4): 30 mg via INTRAVENOUS
  Filled 2023-11-03 (×4): qty 1

## 2023-11-03 MED ORDER — DEXTROSE 5 % IV SOLN
INTRAVENOUS | Status: DC | PRN
  Administered 2023-11-03: 3 g via INTRAVENOUS

## 2023-11-03 MED ORDER — SODIUM CHLORIDE 0.9 % IR SOLN
Status: DC | PRN
  Administered 2023-11-03: 1000 mL

## 2023-11-03 MED ORDER — SIMETHICONE 80 MG PO CHEW
80.0000 mg | CHEWABLE_TABLET | Freq: Three times a day (TID) | ORAL | Status: DC
  Administered 2023-11-03 – 2023-11-06 (×9): 80 mg via ORAL
  Filled 2023-11-03 (×11): qty 1

## 2023-11-03 MED ORDER — MEDROXYPROGESTERONE ACETATE 150 MG/ML IM SUSP: 150.0000 mg | INTRAMUSCULAR | Status: DC | PRN

## 2023-11-03 MED ORDER — MEPERIDINE HCL 25 MG/ML IJ SOLN: 6.2500 mg | INTRAMUSCULAR | Status: DC | PRN

## 2023-11-03 MED ORDER — METHYLERGONOVINE MALEATE 0.2 MG/ML IJ SOLN
INTRAMUSCULAR | Status: DC | PRN
Start: 2023-11-03 — End: 2023-11-03
  Administered 2023-11-03: .2 mg via INTRAMUSCULAR

## 2023-11-03 MED ORDER — LIDOCAINE HCL (PF) 1 % IJ SOLN
INTRAMUSCULAR | Status: AC
  Filled 2023-11-03: qty 5

## 2023-11-03 MED ORDER — WITCH HAZEL-GLYCERIN EX PADS: 1.0000 | MEDICATED_PAD | CUTANEOUS | Status: DC | PRN

## 2023-11-03 MED ORDER — TRANEXAMIC ACID-NACL 1000-0.7 MG/100ML-% IV SOLN
INTRAVENOUS | Status: AC
  Filled 2023-11-03: qty 100

## 2023-11-03 MED ORDER — LIDOCAINE 2% (20 MG/ML) 5 ML SYRINGE
INTRAMUSCULAR | Status: AC
Start: 2023-11-03 — End: ?
  Filled 2023-11-03: qty 5

## 2023-11-03 MED ORDER — METHYLERGONOVINE MALEATE 0.2 MG PO TABS
0.2000 mg | ORAL_TABLET | Freq: Four times a day (QID) | ORAL | Status: AC
  Administered 2023-11-03 – 2023-11-04 (×3): 0.2 mg via ORAL
  Filled 2023-11-03 (×3): qty 1

## 2023-11-03 MED ORDER — OXYTOCIN-SODIUM CHLORIDE 30-0.9 UT/500ML-% IV SOLN
2.5000 [IU]/h | INTRAVENOUS | Status: AC
  Administered 2023-11-03: 2.5 [IU]/h via INTRAVENOUS
  Filled 2023-11-03: qty 500

## 2023-11-03 MED ORDER — ACETAMINOPHEN 160 MG/5ML PO SOLN: 325.0000 mg | ORAL | Status: DC | PRN

## 2023-11-03 MED ORDER — SODIUM CHLORIDE 0.9 % IV SOLN
INTRAVENOUS | Status: DC | PRN
  Administered 2023-11-03: 500 mg via INTRAVENOUS

## 2023-11-03 MED ORDER — FENTANYL CITRATE (PF) 100 MCG/2ML IJ SOLN
INTRAMUSCULAR | Status: AC
Start: 2023-11-03 — End: ?
  Filled 2023-11-03: qty 2

## 2023-11-03 MED ORDER — ONDANSETRON HCL 4 MG/2ML IJ SOLN: 4.0000 mg | Freq: Once | INTRAMUSCULAR | Status: DC | PRN

## 2023-11-03 MED ORDER — COCONUT OIL OIL: 1.0000 | TOPICAL_OIL | Status: DC | PRN

## 2023-11-03 MED ORDER — ENOXAPARIN SODIUM 60 MG/0.6ML IJ SOSY
60.0000 mg | PREFILLED_SYRINGE | INTRAMUSCULAR | Status: DC
  Administered 2023-11-03 – 2023-11-05 (×3): 60 mg via SUBCUTANEOUS
  Filled 2023-11-03 (×3): qty 0.6

## 2023-11-03 MED ORDER — SODIUM CHLORIDE 0.9% FLUSH
3.0000 mL | Freq: Two times a day (BID) | INTRAVENOUS | Status: DC
  Administered 2023-11-04: 3 mL via INTRAVENOUS
  Administered 2023-11-05: 5 mL via INTRAVENOUS

## 2023-11-03 MED ORDER — ACETAMINOPHEN 500 MG PO TABS
1000.0000 mg | ORAL_TABLET | Freq: Four times a day (QID) | ORAL | Status: DC
  Administered 2023-11-03 – 2023-11-06 (×11): 1000 mg via ORAL
  Filled 2023-11-03 (×12): qty 2

## 2023-11-03 MED ORDER — MAGNESIUM HYDROXIDE 400 MG/5ML PO SUSP: 30.0000 mL | ORAL | Status: DC | PRN

## 2023-11-03 MED ORDER — GABAPENTIN 100 MG PO CAPS
200.0000 mg | ORAL_CAPSULE | Freq: Every day | ORAL | Status: DC
  Administered 2023-11-03 – 2023-11-05 (×3): 200 mg via ORAL
  Filled 2023-11-03 (×3): qty 2

## 2023-11-03 MED ORDER — SIMETHICONE 80 MG PO CHEW: 80.0000 mg | CHEWABLE_TABLET | ORAL | Status: DC | PRN

## 2023-11-03 MED ORDER — SENNOSIDES-DOCUSATE SODIUM 8.6-50 MG PO TABS
2.0000 | ORAL_TABLET | Freq: Every day | ORAL | Status: DC
  Administered 2023-11-04 – 2023-11-06 (×3): 2 via ORAL
  Filled 2023-11-03 (×3): qty 2

## 2023-11-03 MED ORDER — MORPHINE SULFATE (PF) 0.5 MG/ML IJ SOLN
INTRAMUSCULAR | Status: AC
  Filled 2023-11-03: qty 10

## 2023-11-03 MED ORDER — MEASLES, MUMPS & RUBELLA VAC IJ SOLR: 0.5000 mL | Freq: Once | INTRAMUSCULAR | Status: DC

## 2023-11-03 MED ORDER — OXYCODONE HCL 5 MG PO TABS: 5.0000 mg | ORAL_TABLET | Freq: Once | ORAL | Status: DC | PRN

## 2023-11-03 MED ORDER — IBUPROFEN 600 MG PO TABS
600.0000 mg | ORAL_TABLET | Freq: Four times a day (QID) | ORAL | Status: DC
  Administered 2023-11-04 – 2023-11-06 (×8): 600 mg via ORAL
  Filled 2023-11-03 (×9): qty 1

## 2023-11-03 MED ORDER — DEXAMETHASONE SODIUM PHOSPHATE 10 MG/ML IJ SOLN
INTRAMUSCULAR | Status: DC | PRN
  Administered 2023-11-03: 10 mg via INTRAVENOUS

## 2023-11-03 MED ORDER — SODIUM CHLORIDE 0.9 % IV SOLN
INTRAVENOUS | Status: AC
  Filled 2023-11-03: qty 5

## 2023-11-03 MED ORDER — DEXAMETHASONE SODIUM PHOSPHATE 10 MG/ML IJ SOLN
INTRAMUSCULAR | Status: AC
  Filled 2023-11-03: qty 1

## 2023-11-03 MED ORDER — OXYTOCIN-SODIUM CHLORIDE 30-0.9 UT/500ML-% IV SOLN
INTRAVENOUS | Status: AC
  Filled 2023-11-03: qty 500

## 2023-11-03 MED ORDER — ZOLPIDEM TARTRATE 5 MG PO TABS: 5.0000 mg | ORAL_TABLET | Freq: Every evening | ORAL | Status: DC | PRN

## 2023-11-03 MED ORDER — OXYTOCIN-SODIUM CHLORIDE 30-0.9 UT/500ML-% IV SOLN
INTRAVENOUS | Status: DC | PRN
  Administered 2023-11-03 (×2): 30 [IU] via INTRAVENOUS

## 2023-11-03 MED ORDER — SUCCINYLCHOLINE CHLORIDE 200 MG/10ML IV SOSY
PREFILLED_SYRINGE | INTRAVENOUS | Status: AC
  Filled 2023-11-03: qty 10

## 2023-11-03 MED ORDER — TETANUS-DIPHTH-ACELL PERTUSSIS 5-2.5-18.5 LF-MCG/0.5 IM SUSY: 0.5000 mL | PREFILLED_SYRINGE | Freq: Once | INTRAMUSCULAR | Status: DC

## 2023-11-03 MED ORDER — ACETAMINOPHEN 10 MG/ML IV SOLN
INTRAVENOUS | Status: AC
  Filled 2023-11-03: qty 100

## 2023-11-03 MED ORDER — SUCCINYLCHOLINE CHLORIDE 200 MG/10ML IV SOSY
PREFILLED_SYRINGE | INTRAVENOUS | Status: DC | PRN
  Administered 2023-11-03: 180 mg via INTRAVENOUS

## 2023-11-03 MED ORDER — DIPHENHYDRAMINE HCL 25 MG PO CAPS
50.0000 mg | ORAL_CAPSULE | Freq: Once | ORAL | Status: AC
  Administered 2023-11-03: 50 mg via ORAL
  Filled 2023-11-03: qty 2

## 2023-11-03 MED ORDER — OXYCODONE HCL 5 MG PO TABS
5.0000 mg | ORAL_TABLET | ORAL | Status: DC | PRN
  Administered 2023-11-05 – 2023-11-06 (×2): 5 mg via ORAL
  Filled 2023-11-03 (×2): qty 1

## 2023-11-03 MED ORDER — TRANEXAMIC ACID-NACL 1000-0.7 MG/100ML-% IV SOLN
INTRAVENOUS | Status: DC | PRN
  Administered 2023-11-03: 1000 mg via INTRAVENOUS

## 2023-11-03 MED ORDER — MENTHOL 3 MG MT LOZG: 1.0000 | LOZENGE | OROMUCOSAL | Status: DC | PRN

## 2023-11-03 MED ORDER — FENTANYL CITRATE (PF) 100 MCG/2ML IJ SOLN
25.0000 ug | INTRAMUSCULAR | Status: DC | PRN
  Administered 2023-11-03 (×2): 50 ug via INTRAVENOUS

## 2023-11-03 MED ORDER — ACETAMINOPHEN 325 MG PO TABS: 325.0000 mg | ORAL_TABLET | ORAL | Status: DC | PRN

## 2023-11-03 MED ORDER — ONDANSETRON HCL 4 MG/2ML IJ SOLN
INTRAMUSCULAR | Status: DC | PRN
  Administered 2023-11-03: 4 mg via INTRAVENOUS

## 2023-11-03 MED ORDER — DIPHENHYDRAMINE HCL 25 MG PO CAPS: 25.0000 mg | ORAL_CAPSULE | Freq: Four times a day (QID) | ORAL | Status: DC | PRN

## 2023-11-03 MED ORDER — FENTANYL CITRATE (PF) 100 MCG/2ML IJ SOLN
INTRAMUSCULAR | Status: AC
  Filled 2023-11-03: qty 2

## 2023-11-03 MED ORDER — STERILE WATER FOR IRRIGATION IR SOLN
Status: DC | PRN
  Administered 2023-11-03: 1000 mL

## 2023-11-03 MED ORDER — DEXMEDETOMIDINE HCL IN NACL 80 MCG/20ML IV SOLN
INTRAVENOUS | Status: AC
  Filled 2023-11-03: qty 20

## 2023-11-03 MED ORDER — LACTATED RINGERS AMNIOINFUSION: INTRAVENOUS | Status: DC

## 2023-11-03 MED ORDER — FENTANYL CITRATE (PF) 250 MCG/5ML IJ SOLN
INTRAMUSCULAR | Status: AC
  Filled 2023-11-03: qty 5

## 2023-11-03 MED ORDER — PRENATAL MULTIVITAMIN CH
1.0000 | ORAL_TABLET | Freq: Every day | ORAL | Status: DC
  Administered 2023-11-04 – 2023-11-05 (×2): 1 via ORAL
  Filled 2023-11-03 (×2): qty 1

## 2023-11-03 MED ORDER — SODIUM CHLORIDE 0.9% FLUSH: 3.0000 mL | INTRAVENOUS | Status: DC | PRN

## 2023-11-03 MED ORDER — ACETAMINOPHEN 10 MG/ML IV SOLN
INTRAVENOUS | Status: DC | PRN
  Administered 2023-11-03: 1000 mg via INTRAVENOUS

## 2023-11-03 MED ORDER — DIBUCAINE (PERIANAL) 1 % EX OINT: 1.0000 | TOPICAL_OINTMENT | CUTANEOUS | Status: DC | PRN

## 2023-11-03 MED ORDER — OXYCODONE HCL 5 MG/5ML PO SOLN: 5.0000 mg | Freq: Once | ORAL | Status: DC | PRN

## 2023-11-03 MED ORDER — FENTANYL CITRATE (PF) 100 MCG/2ML IJ SOLN
INTRAMUSCULAR | Status: DC | PRN
  Administered 2023-11-03: 100 ug via INTRAVENOUS
  Administered 2023-11-03: 50 ug via INTRAVENOUS
  Administered 2023-11-03: 100 ug via INTRAVENOUS
  Administered 2023-11-03: 50 ug via INTRAVENOUS

## 2023-11-03 MED ORDER — PROPOFOL 10 MG/ML IV BOLUS
INTRAVENOUS | Status: DC | PRN
  Administered 2023-11-03: 200 mg via INTRAVENOUS

## 2023-11-03 MED ORDER — ONDANSETRON HCL 4 MG/2ML IJ SOLN
INTRAMUSCULAR | Status: AC
  Filled 2023-11-03: qty 2

## 2023-11-03 MED ORDER — PROPOFOL 10 MG/ML IV BOLUS
INTRAVENOUS | Status: AC
  Filled 2023-11-03: qty 20

## 2023-11-03 SURGICAL SUPPLY — 31 items
BENZOIN TINCTURE PRP APPL 2/3 (GAUZE/BANDAGES/DRESSINGS) ×1 IMPLANT
CHLORAPREP W/TINT 26 (MISCELLANEOUS) ×4 IMPLANT
CLAMP UMBILICAL CORD (MISCELLANEOUS) ×2 IMPLANT
CLOTH BEACON ORANGE TIMEOUT ST (SAFETY) ×2 IMPLANT
DISSECTOR SURG LIGASURE 21 (MISCELLANEOUS) ×1 IMPLANT
DRSG OPSITE POSTOP 4X10 (GAUZE/BANDAGES/DRESSINGS) ×2 IMPLANT
ELECT REM PT RETURN 9FT ADLT (ELECTROSURGICAL) ×2 IMPLANT
ELECTRODE REM PT RTRN 9FT ADLT (ELECTROSURGICAL) ×2 IMPLANT
EXTRACTOR VACUUM BELL STYLE (SUCTIONS) IMPLANT
GAUZE SPONGE 4X4 12PLY STRL (GAUZE/BANDAGES/DRESSINGS) ×2 IMPLANT
GLOVE BIOGEL PI IND STRL 6.5 (GLOVE) ×4 IMPLANT
GLOVE ECLIPSE 6.5 STRL STRAW (GLOVE) ×4 IMPLANT
GOWN STRL REUS W/TWL LRG LVL3 (GOWN DISPOSABLE) ×6 IMPLANT
KIT ABG SYR 3ML LUER SLIP (SYRINGE) IMPLANT
MAT PREVALON FULL STRYKER (MISCELLANEOUS) ×1 IMPLANT
NDL HYPO 25X1 1.5 SAFETY (NEEDLE) IMPLANT
NEEDLE HYPO 22GX1.5 SAFETY (NEEDLE) ×2 IMPLANT
NEEDLE HYPO 25X1 1.5 SAFETY (NEEDLE) IMPLANT
NS IRRIG 1000ML POUR BTL (IV SOLUTION) ×2 IMPLANT
PACK C SECTION WH (CUSTOM PROCEDURE TRAY) ×2 IMPLANT
PAD ABD DERMACEA PRESS 5X9 (GAUZE/BANDAGES/DRESSINGS) ×1 IMPLANT
PAD OB MATERNITY 4.3X12.25 (PERSONAL CARE ITEMS) ×2 IMPLANT
STRIP CLOSURE SKIN 1/2X4 (GAUZE/BANDAGES/DRESSINGS) ×1 IMPLANT
SUT MON AB 4-0 PS1 27 (SUTURE) ×2 IMPLANT
SUT PLAIN ABS 2-0 CT1 27XMFL (SUTURE) ×2 IMPLANT
SUT VIC AB 0 CT1 36 (SUTURE) ×4 IMPLANT
SUT VIC AB 0 CTX36XBRD ANBCTRL (SUTURE) ×2 IMPLANT
SYR CONTROL 10ML LL (SYRINGE) ×2 IMPLANT
TOWEL OR 17X24 6PK STRL BLUE (TOWEL DISPOSABLE) ×2 IMPLANT
TRAY FOLEY W/BAG SLVR 14FR LF (SET/KITS/TRAYS/PACK) ×2 IMPLANT
WATER STERILE IRR 1000ML POUR (IV SOLUTION) ×2 IMPLANT

## 2023-11-03 NOTE — Discharge Summary (Signed)
 Postpartum Discharge Summary  Date of Service updated***     Patient Name: Chelsea Baldwin DOB: Apr 08, 1991 MRN: [id number]  Date of admission: 11/02/2023 Delivery date:11/03/2023 Delivering provider: Lazaro Arms Date of discharge: 11/03/2023  Admitting diagnosis: Obesity affecting pregnancy [O99.210] Intrauterine pregnancy: [redacted]w[redacted]d     Secondary diagnosis:  Active Problems:   S/P repeat low transverse C-section   Obesity affecting pregnancy   History of bilateral tubal ligation  Additional problems: ***    Discharge diagnosis: Term Pregnancy Delivered                                              Post partum procedures:postpartum tubal ligation Augmentation: AROM and Pitocin Complications: {OB Labor/Delivery Complications:20784}  Hospital course: Induction of Labor With Cesarean Section   33 y.o. yo O1H0865 at [redacted]w[redacted]d was admitted to the hospital 11/02/2023 for induction of labor. Patient had a labor course significant for deep recurrent variables not improved with amnioinfusion, terbutaline, and repositioning. The patient went for cesarean section due to Non-Reassuring FHR. Delivery details are as follows: Membrane Rupture Time/Date: 1:48 AM,11/03/2023  Delivery Method:C-Section, Low Transverse Operative Delivery:N/A Details of operation can be found in separate operative Note.  Patient had a postpartum course complicated by***. She is ambulating, tolerating a regular diet, passing flatus, and urinating well.  Patient is discharged home in stable condition on 11/03/23.      Newborn Data: Birth date:11/03/2023 Birth time:7:47 AM Gender:Female Living status:Living Apgars:8 ,9  Weight:3100 g                               Magnesium Sulfate received: {Mag received:30440022} BMZ received: No Rhophylac:N/A MMR:Yes T-DaP:Given prenatally Flu: Yes RSV Vaccine received: No Transfusion:{Transfusion received:30440034}  Immunizations received: Immunization History  Administered  Date(s) Administered   Influenza, Seasonal, Injecte, Preservative Fre 05/29/2023   Influenza,inj,Quad PF,6+ Mos 06/02/2018, 07/23/2019, 10/10/2020, 08/17/2021   PFIZER Comirnaty(Gray Top)Covid-19 Tri-Sucrose Vaccine 02/08/2021, 03/03/2021   PPD Test 02/08/2021, 09/13/2021   Tdap 06/02/2018, 12/03/2019, 08/07/2023    Physical exam  Vitals:   11/03/23 0400 11/03/23 0429 11/03/23 0650 11/03/23 0849  BP:  (!) 142/89 (!) 133/90 127/86  Pulse:  76 79 (!) 105  Resp:  18  20  Temp: 98.4 F (36.9 C)     TempSrc: Oral     SpO2:    99%  Weight:      Height:       General: {Exam; general:21111117} Lochia: {Desc; appropriate/inappropriate:30686::"appropriate"} Uterine Fundus: {Desc; firm/soft:30687} Incision: {Exam; incision:21111123} DVT Evaluation: {Exam; dvt:2111122} Labs: Lab Results  Component Value Date   WBC 9.3 11/02/2023   HGB 9.9 (L) 11/02/2023   HCT 28.5 (L) 11/02/2023   MCV 76.6 (L) 11/02/2023   PLT 292 11/02/2023      Latest Ref Rng & Units 11/02/2023   10:39 PM  CMP  Glucose 70 - 99 mg/dL 84   BUN 6 - 20 mg/dL 7   Creatinine 7.84 - 6.96 mg/dL 2.95   Sodium 284 - 132 mmol/L 136   Potassium 3.5 - 5.1 mmol/L 4.0   Chloride 98 - 111 mmol/L 107   CO2 22 - 32 mmol/L 19   Calcium 8.9 - 10.3 mg/dL 8.6   Total Protein 6.5 - 8.1 g/dL 6.2   Total Bilirubin 0.0 - 1.2 mg/dL 0.4  Alkaline Phos 38 - 126 U/L 72   AST 15 - 41 U/L 17   ALT 0 - 44 U/L 11    Edinburgh Score:    01/15/2020    8:30 PM  Edinburgh Postnatal Depression Scale Screening Tool  I have been able to laugh and see the funny side of things. 0  I have looked forward with enjoyment to things. 0  I have blamed myself unnecessarily when things went wrong. 0  I have been anxious or worried for no good reason. 0  I have felt scared or panicky for no good reason. 0  Things have been getting on top of me. 0  I have been so unhappy that I have had difficulty sleeping. 0  I have felt sad or miserable. 0  I  have been so unhappy that I have been crying. 0  The thought of harming myself has occurred to me. 0  Edinburgh Postnatal Depression Scale Total 0   No data recorded  After visit meds:  Allergies as of 11/03/2023   No Known Allergies   Med Rec must be completed prior to using this [facility]***        Discharge home in stable condition Infant Feeding: Breast Infant Disposition:home with mother Discharge instruction: per After Visit Summary and Postpartum booklet. Activity: Advance as tolerated. Pelvic rest for 6 weeks.  Diet: routine diet Future Appointments: Future Appointments  Date Time Provider Department Center  12/11/2023  2:35 PM Adam Phenix, MD [facility] Center   Follow up Visit:  Follow-up Information     Center for [facility] at [facility] for Women. Schedule an appointment as soon as possible for a visit in 1 week(s).   Specialty: Obstetrics and Gynecology Why: Incision and blood pressure check Contact information: [address] [place] [address] [phone]        Center for [employer] at [facility] for Women. Schedule an appointment as soon as possible for a visit.   Specialty: Obstetrics and Gynecology Why: Routine postpartum follow up at 4-6 weeks Contact information: [address] [place] [address] [phone]               Message to [facility] 3/16  Please schedule this patient for a In person postpartum visit in 4 weeks with the following provider: Any provider. Additional Postpartum F/U:Postpartum Depression checkup, Incision check 1 week, and BP check 1 week  High risk pregnancy complicated by: HTN Delivery mode:  C-Section, Low Transverse Anticipated Birth Control:  BTL done [facility]   11/03/2023 Wyn Forster, MD

## 2023-11-03 NOTE — Anesthesia Postprocedure Evaluation (Signed)
 Anesthesia Post Note  Patient: Chelsea Baldwin  Procedure(s) Performed: CESAREAN SECTION, WITH BILATERAL TUBAL LIGATION (Abdomen)     Patient location during evaluation: PACU Anesthesia Type: General Level of consciousness: awake and alert Pain management: pain level controlled Vital Signs Assessment: post-procedure vital signs reviewed and stable Respiratory status: spontaneous breathing, nonlabored ventilation, respiratory function stable and patient connected to nasal cannula oxygen Cardiovascular status: blood pressure returned to baseline and stable Postop Assessment: no apparent nausea or vomiting Anesthetic complications: no   No notable events documented.  Last Vitals:  Vitals:   11/03/23 0919 11/03/23 0930  BP:  118/76  Pulse: 91 87  Resp: 16 (!) 25  Temp:    SpO2: 100% 100%    Last Pain:  Vitals:   11/03/23 0932  TempSrc:   PainSc: 1    Pain Goal:                Epidural/Spinal Function Cutaneous sensation: Able to Wiggle Toes (11/03/23 0915), Patient able to flex knees: Yes (11/03/23 0915), Patient able to lift hips off bed: No (11/03/23 0915)  Aharon Carriere

## 2023-11-03 NOTE — Anesthesia Preprocedure Evaluation (Signed)
 Anesthesia Evaluation  Patient identified by MRN, date of birth, ID band Patient awake    Reviewed: Allergy & Precautions, H&P , NPO status , Patient's Chart, lab work & pertinent test results  Airway Mallampati: III  TM Distance: >3 FB Neck ROM: Full    Dental no notable dental hx.    Pulmonary neg pulmonary ROS, former smoker   Pulmonary exam normal breath sounds clear to auscultation       Cardiovascular Exercise Tolerance: Good negative cardio ROS Normal cardiovascular exam Rhythm:Regular Rate:Normal     Neuro/Psych negative neurological ROS  negative psych ROS   GI/Hepatic negative GI ROS, Neg liver ROS,,,  Endo/Other  negative endocrine ROS    Renal/GU negative Renal ROS  negative genitourinary   Musculoskeletal negative musculoskeletal ROS (+)    Abdominal   Peds negative pediatric ROS (+)  Hematology  (+) Blood dyscrasia, anemia   Anesthesia Other Findings   Reproductive/Obstetrics negative OB ROS                             Anesthesia Physical Anesthesia Plan  ASA: 3 and emergent  Anesthesia Plan: General   Post-op Pain Management: Minimal or no pain anticipated   Induction: Intravenous  PONV Risk Score and Plan: 3 and Ondansetron, Dexamethasone and Treatment may vary due to age or medical condition  Airway Management Planned: Oral ETT  Additional Equipment: None  Intra-op Plan:   Post-operative Plan: Extubation in OR  Informed Consent: I have reviewed the patients History and Physical, chart, labs and discussed the procedure including the risks, benefits and alternatives for the proposed anesthesia with the patient or authorized representative who has indicated his/her understanding and acceptance.     Dental advisory given  Plan Discussed with: Anesthesiologist  Anesthesia Plan Comments: (  )       Anesthesia Quick Evaluation

## 2023-11-03 NOTE — Progress Notes (Signed)
 Minnah Llamas is a 33 y.o. [id number] at [redacted]w[redacted]d presented for IOL for gHTN S: Feeling contractions, opting for IV fentanyl. Discussed risks, benefits and indications for AROM; pt agreeable   O:  BP 131/81   Pulse 80   Temp 98.1 F (36.7 C) (Oral)   Resp 16   Ht 5\' 2"  (1.575 m)   Wt 126.6 kg   LMP 01/30/2023   BMI 51.03 kg/m    EFM: baseline 135, no accels, no decels, moderate variability TOCO: q3-72min contractions   Dilation: 3 Effacement (%): 50 Cervical Position: Posterior Station: -2, -3 Presentation: Vertex Exam by:: Dr Leanora Cover     A&P: 33 y.o. [id number] [redacted]w[redacted]d admitted for IOL #Labor: AROM clear, continue to titrate pitocin #Pain: Mild #FWB: Cat I #GBS positive; PCN   #GHTN: mild range BP, asymptomatic; Labs: Plt 292, CMP wnl

## 2023-11-03 NOTE — Progress Notes (Signed)
 Called for recurrent deep variabiles to the 50s  Blood pressure (!) 133/90, pulse 79, temperature 98.4 F (36.9 C), temperature source Oral, resp. rate 18, height 5\' 2"  (1.575 m), weight 126.6 kg, last menstrual period 01/30/2023, currently breastfeeding.  Terbutaline given Amnioinfusion started Many repositionings without significant improvement  The risks of cesarean section were discussed with the patient; including but not limited to: infection which may require antibiotics; bleeding which may require transfusion or re-operation; injury to bowel, bladder, ureters or other surrounding organs; injury to the fetus; need for additional procedures including hysterectomy in the event of a life-threatening hemorrhage; placental abnormalities wth subsequent pregnancies,  risk of needing c-sections in future pregnancies, incisional problems, thromboembolic phenomenon and other postoperative/anesthesia complications. Answered all questions. The patient verbalized understanding of the plan, giving informed consent for the procedure. She is agreeable to blood transfusion in the event of emergency.  Patient has been NPO since 0900, she will remain NPO for procedure Dr. Despina Hidden, Anesthesia and OR aware Preoperative prophylactic antibiotics and SCDs ordered on call to the OR  To OR for immediate repeat cesarean under general anesthesia  Wyn Forster, MD FMO[facility] Fellow, Faculty practice [facility], Center for [facility]

## 2023-11-03 NOTE — Progress Notes (Signed)
 Called for tachysystole (while in another delivery)  Verbal orders given to decrease Pitocin to 6 If no improvement or worsening fetal status discontinue pitocin  Blood pressure (!) 142/89, pulse 76, temperature 98.4 F (36.9 C), temperature source Oral, resp. rate 18, height 5\' 2"  (1.575 m), weight 126.6 kg, last menstrual period 01/30/2023, currently breastfeeding.  Dilation: 6 Effacement (%): 80 Cervical Position: Middle Station: -2 Presentation: Vertex Exam by:: J Turner RN  Wyn Forster, MD FMOB Fellow, Faculty practice [facility], Center for [employer]

## 2023-11-03 NOTE — Op Note (Signed)
 Chelsea Baldwin PROCEDURE DATE: 11/03/2023  PREOPERATIVE DIAGNOSES: Intrauterine pregnancy at [redacted]w[redacted]d weeks gestation; non-reassuring fetal status remote from delivery  POSTOPERATIVE DIAGNOSES: The same  PROCEDURE: Primary Low Transverse Cesarean Section and Bilateral Tubal Ligation  SURGEON:  Dr. Duane Lope  ASSISTANT:  Dr. Wyn Forster An experienced assistant was required given the standard of surgical care given the complexity of the case.  This assistant was needed for exposure, dissection, suctioning, retraction, instrument exchange, and for overall help during the procedure.   ANESTHESIOLOGY TEAM: Anesthesiologist: Bethena Midget, MD CRNA: Claudina Lick, CRNA; Elgie Congo, CRNA  INDICATIONS: Chelsea Baldwin is a 33 y.o. [phone] at [redacted]w[redacted]d here for cesarean section secondary to the indications listed under preoperative diagnoses; please see preoperative note for further details.  The risks of surgery were discussed with the patient including but were not limited to: bleeding which may require transfusion or reoperation; infection which may require antibiotics; injury to bowel, bladder, ureters or other surrounding organs; injury to the fetus; need for additional procedures including hysterectomy in the event of a life-threatening hemorrhage; formation of adhesions; placental abnormalities wth subsequent pregnancies; incisional problems; thromboembolic phenomenon and other postoperative/anesthesia complications.  The patient concurred with the proposed plan, giving informed written consent for the procedure.    FINDINGS:  Viable female infant in cephalic presentation.  Apgars 8 and 9.  Amniotic fluid: clear.  Intact placenta, three vessel cord.  Normal uterus, fallopian tubes and ovaries bilaterally.  ANESTHESIA: general INTRAVENOUS FLUIDS: 1000 ml   ESTIMATED BLOOD LOSS: 1080 ml URINE OUTPUT:  50 ml SPECIMENS: Placenta sent to pathology  COMPLICATIONS: None  immediate  PROCEDURE IN DETAIL:  The patient preoperatively received intravenous antibiotics and had sequential compression devices applied to her lower extremities.  She was then taken to the operating room where  general anesthesia . She was then placed in a dorsal supine position with a leftward tilt, and prepped and draped in a sterile manner.  A foley catheter was placed to gravity.  After an adequate timeout was performed, a Pfannenstiel skin incision was made with scalpel and carried through to the underlying layer of fascia. The fascia was incised in the midline, and this incision was extended bluntly. The rectus muscles were separated in the midline and the peritoneum was entered bluntly.   The Alexis self-retaining retractor was introduced into the abdominal cavity.  Attention was turned to the lower uterine segment where a low transverse hysterotomy was made with a scalpel and extended bilaterally bluntly.  The infant was successfully delivered, the cord was clamped and cut after one minute, and the infant was handed over to the awaiting neonatology team. Uterine massage was then administered, and the placenta delivered intact with a three-vessel cord. The uterus was then cleared of clots and debris.  The hysterotomy was closed with 0 Monocryl in a running fashion with hemostasis.    Attention was then turned to the left fallopian tube, and Babcock clamps were used to grasp the tube. The Ligasure device was used to excise the entire left fallopian tube from the underlying mesosalpinx and the uterus.  The right fallopian tube was then identified, grasped with Babcock clamps and excised in a similar fashion using the Ligasure device allowing for bilateral salpingectomy.   The pelvis was cleared of all clot and debris. Hemostasis was confirmed on all surfaces.  The retractor was removed.  The peritoneum was closed with a 2-0 Vicryl running stitch. The fascia was then closed using 0 Vicryl in  a  running fashion.  The subcutaneous layer was irrigated, and was found to be hemostatic, any areas of bleeding were cauterized with the bovie, and was reapproximated with 2-0 plain gut in a running fashion. The skin was closed with a 4-0 monocryl subcuticular stitch. The patient tolerated the procedure well. Sponge, instrument and needle counts were correct x 3.  She was taken to the recovery room in stable condition.   Wyn Forster, MD FMOB Fellow, Faculty practice [facility], Center for [facility]

## 2023-11-03 NOTE — Lactation Note (Signed)
 This note was copied from a baby's chart. Lactation Consultation Note  Patient Name: Chelsea Baldwin Today's Date: 11/03/2023 Age:33 hours   P4, 39 wks, @ 8 hrs of life. Per mom feeding very well- experienced breast feeder. Encouraged more hands on approach and working on big mouth latch with newborn. Discussed expectations @ breast- Day 1- sleepy/ feed every 3 hrs/ even 10 minutes is okay, Day 2 more awake/ feeding cues/longer feeds, and cluster feeding overnights brings milk in. Highlighted breast stimulation is tied directly to milk production. Discussed hands on breast and baby, keeping baby awake @ breast. Starting with hand expression & breast compression to get baby working @ breast, and gentle stimulation to keep baby working @ breast. Encouraged EBM for nipple care post feed. LC services and milk storage shared. Encouraged mom to call for assist anytime desired.   Maternal Data Has patient been taught Hand Expression?: Yes Does the patient have breastfeeding experience prior to this delivery?: Yes How long did the patient breastfeed?: 5-6 months with first 2 babies, 18 months with last baby  Feeding Mother's Current Feeding Choice: Breast Milk   Interventions Interventions: Breast feeding basics reviewed;Hand express;Breast compression;Education;LC Services brochure;CDC milk storage guidelines  Discharge Pump: Hands Free;Personal (Per mom hashands free @ home, discussed hand pump available as well,  if mom desires- best for moving milk from engorged breast)  Consult Status Consult Status: Follow-up Date: 11/04/23 Follow-up type: In-patient    [facility] 11/03/2023, 4:28 PM

## 2023-11-03 NOTE — Anesthesia Procedure Notes (Signed)
 Procedure Name: Intubation Date/Time: 11/03/2023 7:44 AM  Performed by: Elgie Congo, CRNAPre-anesthesia Checklist: Patient identified, Emergency Drugs available, Suction available and Patient being monitored Patient Re-evaluated:Patient Re-evaluated prior to induction Oxygen Delivery Method: Circle system utilized Preoxygenation: Pre-oxygenation with 100% oxygen Induction Type: IV induction, Rapid sequence and Cricoid Pressure applied Laryngoscope Size: Glidescope Grade View: Grade I Tube type: Oral Tube size: 7.0 mm Number of attempts: 1 Airway Equipment and Method: Rigid stylet Placement Confirmation: ETT inserted through vocal cords under direct vision, positive ETCO2 and breath sounds checked- equal and bilateral Secured at: 20 cm Tube secured with: Tape Dental Injury: Teeth and Oropharynx as per pre-operative assessment

## 2023-11-03 NOTE — Transfer of Care (Signed)
 Immediate Anesthesia Transfer of Care Note  Patient: Chelsea Baldwin  Procedure(s) Performed: CESAREAN SECTION, WITH BILATERAL TUBAL LIGATION (Abdomen)  Patient Location: PACU  Anesthesia Type:General  Level of Consciousness: awake, alert , and oriented  Airway & Oxygen Therapy: Patient Spontanous Breathing and Patient connected to face mask oxygen  Post-op Assessment: Report given to RN and Post -op Vital signs reviewed and stable  Post vital signs: Reviewed and stable  Last Vitals:  Vitals Value Taken Time  BP 127/86 11/03/23 0849  Temp    Pulse 96 11/03/23 0854  Resp 23 11/03/23 0854  SpO2 100 % 11/03/23 0854  Vitals shown include unfiled device data.  Last Pain:  Vitals:   11/03/23 0852  TempSrc:   PainSc: 8          Complications: No notable events documented.

## 2023-11-04 ENCOUNTER — Encounter (HOSPITAL_COMMUNITY): Payer: Self-pay | Admitting: Obstetrics & Gynecology

## 2023-11-04 LAB — CBC
HCT: 22.8 % — ABNORMAL LOW (ref 36.0–46.0)
Hemoglobin: 7.8 g/dL — ABNORMAL LOW (ref 12.0–15.0)
MCH: 26.5 pg (ref 26.0–34.0)
MCHC: 34.2 g/dL (ref 30.0–36.0)
MCV: 77.6 fL — ABNORMAL LOW (ref 80.0–100.0)
Platelets: 244 10*3/uL (ref 150–400)
RBC: 2.94 MIL/uL — ABNORMAL LOW (ref 3.87–5.11)
RDW: 14.4 % (ref 11.5–15.5)
WBC: 21.8 10*3/uL — ABNORMAL HIGH (ref 4.0–10.5)
nRBC: 0 % (ref 0.0–0.2)

## 2023-11-04 NOTE — Social Work (Signed)
 CSW received consult for hx of Anxiety and Depression.  CSW met with [name] to offer support and complete assessment. CSW entered the room and observed [name] at bedside holding the infant. CSW introduced self, CSW role and reason for visit. [name] was agreeable to visit.  CSW inquired about how [name] was feeling [name] reported good. CSW inquired about [name] MH hx, [name] reported she has been diagnosed with anxiety and depression and currently sees a therapy weekly of every other week. [name] reported therapy has been beneficial for her mood. CSW assessed for safety, [name] denied any SI, HI or DV. CSW provided education regarding the baby blues period vs. perinatal mood disorders, discussed treatment and gave resources for mental health follow up if concerns arise.  CSW recommends self-evaluation during the postpartum time period using the New Mom Checklist from Postpartum Progress and encouraged [name] to contact a medical professional if symptoms are noted at any time.  [name] identified her mom and grandmother as her primary supports.   CSW provided review of Sudden Infant Death Syndrome (SIDS) precautions.  [name] reported she has all necessary items for the infant including a bassinet and car seat.    CSW received consult for hx of marijuana use.  Referral was screened out due to the following: ~[name] had no documented substance use after initial prenatal visit/+UPT. ~[name] had no positive drug screens after initial prenatal visit/+UPT. ~Baby's UDS is negative.  Please consult CSW if current concerns arise or by [name]'s request.  CSW will monitor CDS results and make report to Child Protective Services if warranted. CSW identifies no further need for intervention and no barriers to discharge at this time.  Wende Neighbors, LCSWA Clinical Social Worker [phone]

## 2023-11-04 NOTE — Progress Notes (Signed)
 POSTPARTUM PROGRESS NOTE  Subjective: Chelsea Baldwin is a 33 y.o. [id number] s/p RCS at [redacted]w[redacted]d.  She reports she doing well. No acute events overnight. She denies any problems with ambulating, voiding or po intake. Denies nausea or vomiting. She has passed flatus. Pain is well controlled.  Lochia is appropriate.  Objective: Blood pressure 123/73, pulse 80, temperature 98.1 F (36.7 C), temperature source Oral, resp. rate 18, height 5\' 2"  (1.575 m), weight 126.6 kg, last menstrual period 01/30/2023, SpO2 99%, currently breastfeeding.  Physical Exam:  General: alert, cooperative and no distress Chest: no respiratory distress Abdomen: soft, non-tender  Uterine Fundus: firm and at level of umbilicus Extremities: No calf swelling or tenderness  no edema  Recent Labs    11/02/23 1611 11/04/23 0342  HGB 9.9* 7.8*  HCT 28.5* 22.8*    Assessment/Plan: Avaley Coop is a 33 y.o. [id number] s/p RCS at [redacted]w[redacted]d. Failed IOL due to recurrent deep variables with fetal hr in the 50s. Pt reports pain is well controlled. She has been able to ambulate and is voiding.   Routine Postpartum Care: Doing well, pain well-controlled.  -- Continue routine care, lactation support  -- Contraception: BTL (complete) -- Feeding: breast  Anemia:  Starting Hgb 9.9 repeat today was 7.8. Pt denies fatigue or syncope. Reports feeling well. Discharge on oral iron.  Dispo: Plan for discharge tomorrow.  Hewitt Shorts, MPH Faculty Practice, Center for [facility] 11/04/2023 11:05 AM

## 2023-11-04 NOTE — Lactation Note (Addendum)
 This note was copied from a baby's chart. Lactation Consultation Note  Patient Name: Chelsea Baldwin Today's Date: 11/04/2023 Age:33 hours Reason for consult: Follow-up assessment;Hyperbilirubinemia (weight loss -5.90%) C/O: infant on phototherapy, ABO incompatibility, DAT+ Per [name], infant is BF well at the breast, finished breastfeeding for 20 minutes prior to [facility] entering the room. [name] open to using the DEBP due infant being DAT+ to give infant  pumped EBM or donor breast milk after infant latches at the breast.   [name] knows that her EBM is safe for 4 hours at room temperature.  Current feeding plan: 1-[name] will continue to breastfeed infant by cues, on demand, every 2-3 hours. 2- Will supplement infant with pumped EBM/ donor breast milk and offer 7-12 mls per feeding, or more if infant wants it, until phototherapy is resolved and milk supply is established. 3- [name] will continue to pump every 3 hours for 15 minutes on initial setting.  Maternal Data    Feeding Mother's Current Feeding Choice: Breast Milk  LATCH Score  LC did not observe latch, infant finished BF prior to LC waking in the room.                   Lactation Tools Discussed/Used Tools: Pump;Flanges Flange Size: 24 Breast pump type: Double-Electric Breast Pump Pump Education: Setup, frequency, and cleaning;Milk Storage Reason for Pumping: Infant is on phototherapy Pumping frequency: [name] will continue to pump every 3 hours for 15 minutes on inital setting. Pumped volume: 3 mL ([name] was still expressing colostrum as LC left the room.)  Interventions Interventions: Skin to skin;Breast compression;Expressed milk;Education;Pace feeding;Guidelines for Milk Supply and Pumping Schedule Handout;CDC milk storage guidelines;CDC Guidelines for Breast Pump Cleaning  Discharge Pump: DEBP;Personal  Consult Status Consult Status: Follow-up Date: 11/05/23 Follow-up type: In-patient    Frederico Hamman 11/04/2023, 7:00 PM

## 2023-11-05 DIAGNOSIS — D72829 Elevated white blood cell count, unspecified: Secondary | ICD-10-CM | POA: Insufficient documentation

## 2023-11-05 LAB — CBC WITH DIFFERENTIAL/PLATELET
Abs Immature Granulocytes: 0.09 10*3/uL — ABNORMAL HIGH (ref 0.00–0.07)
Basophils Absolute: 0 10*3/uL (ref 0.0–0.1)
Basophils Relative: 0 %
Eosinophils Absolute: 0.3 10*3/uL (ref 0.0–0.5)
Eosinophils Relative: 2 %
HCT: 21.5 % — ABNORMAL LOW (ref 36.0–46.0)
Hemoglobin: 7.6 g/dL — ABNORMAL LOW (ref 12.0–15.0)
Immature Granulocytes: 1 %
Lymphocytes Relative: 22 %
Lymphs Abs: 3 10*3/uL (ref 0.7–4.0)
MCH: 27 pg (ref 26.0–34.0)
MCHC: 35.3 g/dL (ref 30.0–36.0)
MCV: 76.5 fL — ABNORMAL LOW (ref 80.0–100.0)
Monocytes Absolute: 0.7 10*3/uL (ref 0.1–1.0)
Monocytes Relative: 5 %
Neutro Abs: 9.2 10*3/uL — ABNORMAL HIGH (ref 1.7–7.7)
Neutrophils Relative %: 70 %
Platelets: 266 10*3/uL (ref 150–400)
RBC: 2.81 MIL/uL — ABNORMAL LOW (ref 3.87–5.11)
RDW: 14.8 % (ref 11.5–15.5)
WBC: 13.3 10*3/uL — ABNORMAL HIGH (ref 4.0–10.5)
nRBC: 0 % (ref 0.0–0.2)

## 2023-11-05 LAB — SURGICAL PATHOLOGY

## 2023-11-05 MED ORDER — ACETAMINOPHEN 500 MG PO TABS: 1000.0000 mg | ORAL_TABLET | Freq: Three times a day (TID) | ORAL | 0 refills | Status: AC | PRN

## 2023-11-05 MED ORDER — SODIUM CHLORIDE 0.9 % IV SOLN
500.0000 mg | Freq: Once | INTRAVENOUS | Status: AC
  Administered 2023-11-05: 500 mg via INTRAVENOUS
  Filled 2023-11-05: qty 25

## 2023-11-05 MED ORDER — OXYCODONE HCL 5 MG PO TABS
5.0000 mg | ORAL_TABLET | ORAL | 0 refills | Status: AC | PRN
Start: 2023-11-05 — End: ?

## 2023-11-05 MED ORDER — SENNOSIDES-DOCUSATE SODIUM 8.6-50 MG PO TABS: 2.0000 | ORAL_TABLET | Freq: Every evening | ORAL | 1 refills | Status: AC | PRN

## 2023-11-05 MED ORDER — IBUPROFEN 600 MG PO TABS: 600.0000 mg | ORAL_TABLET | Freq: Three times a day (TID) | ORAL | 0 refills | Status: DC | PRN

## 2023-11-05 NOTE — Lactation Note (Signed)
 This note was copied from a baby's chart. Lactation Consultation Note  Patient Name: Chelsea Baldwin Today's Date: 11/05/2023 Age:33 years Reason for consult: Follow-up assessment;Term;Infant weight loss (hx of HSV- Valtrex (per [name]))  P4- [name] reports that infant is still nursing well. [name] admits to not pumping every 3 hrs, but states that she plans to start pumping more frequently again. Per [name], she does not see much colostrum when she pumps. LC reviewed how this can be normal and her mature milk ideally should come in between day 2-7 pp. [name] denies having any tissue breakdown, but requests some coconut oil to pump with. LC provided this to [name]. [name] was alone in the room at this time, so LC educated [name] on breastfeeding with HSV. LC reassured [name] that it is safe, but still encouraged her to take the precautions if/when she has a breakout. LC encouraged [name] to call for further questions/concerns over this matter.  LC reviewed cluster feeding, CDC milk storage guidelines, LC services handout and engorgement/breast care. LC encouraged [name] to call for further assistance as needed.  Maternal Data Has patient been taught Hand Expression?: Yes Does the patient have breastfeeding experience prior to this delivery?: Yes How long did the patient breastfeed?: 5-6 months with first two children and 18 months with third child  Feeding Mother's Current Feeding Choice: Breast Milk  Lactation Tools Discussed/Used Tools: Pump;Flanges;Coconut oil Flange Size: 24 Breast pump type: Double-Electric Breast Pump;Manual Pump Education: Setup, frequency, and cleaning;Milk Storage Pumping frequency: 15-20 min every 3 hrs  Interventions Interventions: Breast feeding basics reviewed;Coconut oil;Hand pump;DEBP;Education;LC Services brochure  Discharge Discharge Education: Engorgement and breast care;Warning signs for feeding baby Pump: DEBP;Personal  Consult Status Consult Status: Follow-up Date:  11/06/23 Follow-up type: In-patient    Dema Severin BS, IBCLC 11/05/2023, 5:43 PM

## 2023-11-05 NOTE — Patient Instructions (Signed)
 Your appointment with Outpatient Lactation is: Thursday March 27 th at 11:30 am [facility] (First Floor) [address] [place]  Check in under baby's name.  Please bring your baby hungry along with your pump and a bottle of either formula or expressed breast milk. Please also bring your pump flanges and we welcome support people! If you need lactation assistance before your appointment, please call [phone] and press 4 for lactation.

## 2023-11-06 NOTE — Discharge Summary (Signed)
 Postpartum Discharge Summary   Date of Service updated: 11/06/23                            Patient Name: Chelsea Baldwin DOB: 1990/12/23 MRN: [id number]   Date of admission: 11/02/2023 Delivery date:11/03/2023 Delivering provider: Lazaro Arms Date of discharge: 11/05/2023   Admitting diagnosis: Obesity affecting pregnancy [O99.210] Intrauterine pregnancy: [redacted]w[redacted]d     Secondary diagnosis:  Active Problems:   Anemia, iron deficiency   S/P repeat low transverse C-section   Obesity affecting pregnancy   BMI 45.0-49.9, adult (HCC)   GBS (group B Streptococcus carrier), +RV culture, currently pregnant   Unwanted fertility   History of bilateral tubal ligation   Atonic postpartum hemorrhage   Leukocytosis   Additional problems:  None                          Discharge diagnosis: Term Pregnancy Delivered                                              Post partum procedures:postpartum tubal ligation Augmentation: AROM and Pitocin Complications: None   Hospital course: Induction of Labor With Cesarean Section   33 y.o. yo [id number] at [redacted]w[redacted]d was admitted to the hospital 11/02/2023 for induction of labor. Patient had a labor course significant for deep recurrent variables not improved with amnioinfusion, terbutaline, and repositioning. The patient went for cesarean section due to Non-Reassuring FHR. Delivery details are as follows: Membrane Rupture Time/Date: 1:48 AM,11/03/2023  Delivery Method:C-Section, Low Transverse Operative Delivery:N/A Details of operation can be found in separate operative Note.  Patient had a postpartum course complicated by mildly symptotic anemia (fatigue) anemia and leukocytosis. Hgb dropped to 7.6. Discussed PO iron vs Venofer. Transfusion not indicated. Pt requests Venofer after R/B/I discussed. Denied dizziness. WBC dropped 21 to 17. No evidence of infection.  She is ambulating, tolerating a regular diet, passing flatus, and urinating well.  Patient is discharged  home in stable condition on 11/05/23.       Newborn Data: Birth date:11/03/2023 Birth time:7:47 AM Gender:Female Living status:Living Apgars:8 ,9  Weight:3100 g                                Magnesium Sulfate received: No BMZ received: No Rhophylac:N/A MMR:Yes T-DaP:Given prenatally Flu: Yes RSV Vaccine received: No Transfusion:No   Immunizations received:     Immunization History  Administered Date(s) Administered   Influenza, Seasonal, Injecte, Preservative Fre 05/29/2023   Influenza,inj,Quad PF,6+ Mos 06/02/2018, 07/23/2019, 10/10/2020, 08/17/2021   PFIZER Comirnaty(Gray Top)Covid-19 Tri-Sucrose Vaccine 02/08/2021, 03/03/2021   PPD Test 02/08/2021, 09/13/2021   Tdap 06/02/2018, 12/03/2019, 08/07/2023      Physical exam        Vitals:    11/04/23 0517 11/04/23 1300 11/04/23 2149 11/05/23 0445  BP: 123/73 112/63 113/74 103/69  Pulse: 80 81 81 80  Resp: 18 17 18 16   Temp: 98.1 F (36.7 C) 98.2 F (36.8 C) 98.6 F (37 C)    TempSrc: Oral Oral Oral    SpO2: 99% 100% 100% 100%  Weight:          Height:            General: alert Lochia: appropriate Incision:  Healing well with no significant drainage DVT Evaluation: No evidence of DVT seen on physical exam. Labs: Recent Labs       Lab Results  Component Value Date    WBC 13.3 (H) 11/05/2023    HGB 7.6 (L) 11/05/2023    HCT 21.5 (L) 11/05/2023    MCV 76.5 (L) 11/05/2023    PLT 266 11/05/2023          Latest Ref Rng & Units 11/03/2023   10:52 AM  CMP  Creatinine 0.44 - 1.00 mg/dL 1.61     Edinburgh Score:     11/04/2023    1:17 PM  Edinburgh Postnatal Depression Scale Screening Tool  I have been able to laugh and see the funny side of things. 0  I have looked forward with enjoyment to things. 0  I have blamed myself unnecessarily when things went wrong. 0  I have been anxious or worried for no good reason. 0  I have felt scared or panicky for no good reason. 0  Things have been getting on top of  me. 1  I have been so unhappy that I have had difficulty sleeping. 0  I have felt sad or miserable. 0  I have been so unhappy that I have been crying. 0  The thought of harming myself has occurred to me. 0  Edinburgh Postnatal Depression Scale Total 1    Edinburgh Postnatal Depression Scale Total: 1     After visit meds:  Allergies as of 11/05/2023   No Known Allergies         Medication List       STOP taking these medications     valACYclovir 500 MG tablet Commonly known as: VALTREX           TAKE these medications     acetaminophen 500 MG tablet Commonly known as: TYLENOL Take 2 tablets (1,000 mg total) by mouth every 8 (eight) hours as needed for mild pain (pain score 1-3) or moderate pain (pain score 4-6).    ferrous sulfate 324 MG Tbec Take 324 mg by mouth.    fluticasone 50 MCG/ACT nasal spray Commonly known as: FLONASE Place 1 spray into both nostrils daily.    hydrocortisone ointment 0.5 % Apply 1 Application topically 2 (two) times daily as needed for itching.    ibuprofen 600 MG tablet Commonly known as: ADVIL Take 1 tablet (600 mg total) by mouth every 8 (eight) hours as needed for mild pain (pain score 1-3) or moderate pain (pain score 4-6).    oxyCODONE 5 MG immediate release tablet Commonly known as: Oxy IR/ROXICODONE Take 1-2 tablets (5-10 mg total) by mouth every 4 (four) hours as needed for moderate pain (pain score 4-6).    Prenatal Vitamins 28-0.8 MG Tabs Take 1 tablet by mouth daily.    senna-docusate 8.6-50 MG tablet Commonly known as: Senokot-S Take 2 tablets by mouth at bedtime as needed for mild constipation.               Discharge home in stable condition Infant Feeding: Breast Infant Disposition:home with mother Discharge instruction: per After Visit Summary and Postpartum booklet. Activity: Advance as tolerated. Pelvic rest for 6 weeks.  Diet: routine diet Future Appointments:       Future Appointments  Date Time  Provider Department Center  11/11/2023  3:00 PM [facility] NURSE [facility]  12/11/2023  2:35 PM Adam Phenix, MD [facility]    Follow up Visit:   Follow-up  Information       Center for [employer] at [facility]. Schedule an appointment as soon as possible for a visit in 1 week(s).   Specialty: Obstetrics and Gynecology Why: Incision and blood pressure check Contact information: [address] [place] [address] [phone]            Center for [employer] at [facility] for [facility]. Schedule an appointment as soon as possible for a visit.   Specialty: Obstetrics and Gynecology Why: Routine postpartum follow up at 4-6 weeks Contact information: [address] [place] [address] [phone]                      Message to [facility] 3/16   Please schedule this patient for a In person postpartum visit in 4 weeks with the following provider: Any provider. Additional Postpartum F/U:Postpartum Depression checkup, Incision check 1 week, and BP check 1 week  High risk pregnancy complicated by: HTN Delivery mode:  C-Section, Low Transverse Anticipated Birth Control:  BTL done PP     Sharen Counter, CNM 12:07 PM

## 2023-11-07 DIAGNOSIS — F411 Generalized anxiety disorder: Secondary | ICD-10-CM | POA: Diagnosis not present

## 2023-11-08 DIAGNOSIS — F411 Generalized anxiety disorder: Secondary | ICD-10-CM | POA: Diagnosis not present

## 2023-11-11 ENCOUNTER — Ambulatory Visit

## 2023-11-11 VITALS — BP 137/81 | HR 88 | Ht 62.0 in | Wt 271.0 lb

## 2023-11-11 DIAGNOSIS — Z0130 Encounter for examination of blood pressure without abnormal findings: Secondary | ICD-10-CM

## 2023-11-11 DIAGNOSIS — Z4889 Encounter for other specified surgical aftercare: Secondary | ICD-10-CM

## 2023-11-11 DIAGNOSIS — O9081 Anemia of the puerperium: Secondary | ICD-10-CM

## 2023-11-11 MED ORDER — FERROUS SULFATE 325 (65 FE) MG PO TABS: 325.0000 mg | ORAL_TABLET | ORAL | 0 refills | Status: DC

## 2023-11-11 NOTE — Progress Notes (Signed)
 Incision Check Visit  Chelsea Baldwin is here for incision check following repeat c-section on 3/16.   Assessment: Incision looks clean, dry, intact and well approximated. No signs of infection noted.   Education: Reviewed good daily wound care and s/s of infection with patient. Also advised patient to use dry wash cloth if needed to keep incision free of any moisture. Informed pt to contact our office for any questions or concerns.    Blood Pressure Check Visit  Chelsea Baldwin is also here for blood pressure check. Patient's pregnancy was complicated by gHTN. BP today is 137/81. Patient endorses some headache with relief with extra strength tylenol, some trace bilateral peripheral edema. Advised patient to keep legs elevated when able. Denies any dizziness, blurred vision or shortness of breath. Review MAU precaution with patient.   Patient reported that she received IV iron PP and is unsure if she needs to continue PO iron. Patient history reviewed with Dr. Nobie Putnam who advised patient to continue taking PO iron. Ferrous sulfate 325 mg PO, to take every other day, sent to pharmacy.   Patient will follow up post partum visit on 12/11/23 at 2:35 PM. Patient confirmed scheduled appointment.   Quintella Reichert, RN 11/11/2023  3:31 PM

## 2023-11-12 ENCOUNTER — Encounter: Payer: Self-pay | Admitting: *Deleted

## 2023-11-13 DIAGNOSIS — F411 Generalized anxiety disorder: Secondary | ICD-10-CM | POA: Diagnosis not present

## 2023-11-15 MED FILL — Oxytocin Inj 10 Unit/ML: INTRAMUSCULAR | Qty: 3 | Status: AC

## 2023-11-19 DIAGNOSIS — F411 Generalized anxiety disorder: Secondary | ICD-10-CM | POA: Diagnosis not present

## 2023-11-22 DIAGNOSIS — F411 Generalized anxiety disorder: Secondary | ICD-10-CM | POA: Diagnosis not present

## 2023-11-26 DIAGNOSIS — F411 Generalized anxiety disorder: Secondary | ICD-10-CM | POA: Diagnosis not present

## 2023-11-28 DIAGNOSIS — F411 Generalized anxiety disorder: Secondary | ICD-10-CM | POA: Diagnosis not present

## 2023-12-02 DIAGNOSIS — F411 Generalized anxiety disorder: Secondary | ICD-10-CM | POA: Diagnosis not present

## 2023-12-04 ENCOUNTER — Other Ambulatory Visit: Payer: Self-pay

## 2023-12-04 ENCOUNTER — Telehealth: Payer: Self-pay | Admitting: *Deleted

## 2023-12-04 ENCOUNTER — Ambulatory Visit: Admitting: *Deleted

## 2023-12-04 VITALS — BP 129/87 | HR 88 | Ht 62.0 in | Wt 260.1 lb

## 2023-12-04 DIAGNOSIS — Z4889 Encounter for other specified surgical aftercare: Secondary | ICD-10-CM

## 2023-12-04 NOTE — Telephone Encounter (Signed)
 Chelsea Baldwin had not read message re: appointment to check her wound today. I called and informed her and she said she can come in today. Alejandra Hurst

## 2023-12-04 NOTE — Progress Notes (Signed)
 Pt presents for incision check following C/S on 11/03/23. She states concerns for possible open area of incision which has drainage. Per chart review, pt had incision check on 3/24 and wound was found to be healing well and without signs of infection. Incision was assessed and found to be well healed. On maternal Lt side of incision there was a 3/4 inch section of granulation tissue with small knot under incision. No drainage was noted. Dr. Cresenzo in to examine pt.  She was assured that all is within normal limits and the incision is healing well. Pt voiced understanding.

## 2023-12-06 DIAGNOSIS — F411 Generalized anxiety disorder: Secondary | ICD-10-CM | POA: Diagnosis not present

## 2023-12-07 DIAGNOSIS — F411 Generalized anxiety disorder: Secondary | ICD-10-CM | POA: Diagnosis not present

## 2023-12-10 DIAGNOSIS — F411 Generalized anxiety disorder: Secondary | ICD-10-CM | POA: Diagnosis not present

## 2023-12-11 ENCOUNTER — Ambulatory Visit: Admitting: Obstetrics & Gynecology

## 2023-12-11 ENCOUNTER — Other Ambulatory Visit: Payer: Self-pay

## 2023-12-11 ENCOUNTER — Encounter: Payer: Self-pay | Admitting: Obstetrics & Gynecology

## 2023-12-11 ENCOUNTER — Other Ambulatory Visit (HOSPITAL_COMMUNITY)
Admission: RE | Admit: 2023-12-11 | Discharge: 2023-12-11 | Disposition: A | Discharge status: Home / Self Care | Source: Ambulatory Visit | Attending: Obstetrics & Gynecology | Admitting: Obstetrics & Gynecology

## 2023-12-11 VITALS — BP 116/82 | HR 97

## 2023-12-11 DIAGNOSIS — Z124 Encounter for screening for malignant neoplasm of cervix: Secondary | ICD-10-CM

## 2023-12-11 DIAGNOSIS — Z98891 History of uterine scar from previous surgery: Secondary | ICD-10-CM

## 2023-12-11 NOTE — Progress Notes (Signed)
 Post Partum Visit Note  Chelsea Baldwin is a 33 y.o. [phone] female who presents for a postpartum visit. She is  5.3  weeks postpartum following a repeat cesarean section.  I have fully reviewed the prenatal and intrapartum course. The delivery was at 39.4 gestational weeks.  Anesthesia: general. Postpartum course has been good. Baby is doing well. Baby is feeding by breast. Bleeding no bleeding. Bowel function is normal. Bladder function is normal. Patient is not sexually active. Contraception method is tubal ligation. Postpartum depression screening: negative.   The pregnancy intention screening data noted above was reviewed. Potential methods of contraception were discussed. The patient elected to proceed with No data recorded.   Edinburgh Postnatal Depression Scale - 12/11/23 1500       Edinburgh Postnatal Depression Scale:  In the Past 7 Days   I have been able to laugh and see the funny side of things. 0    I have looked forward with enjoyment to things. 0    I have blamed myself unnecessarily when things went wrong. 0    I have been anxious or worried for no good reason. 0    I have felt scared or panicky for no good reason. 0    Things have been getting on top of me. 0    I have been so unhappy that I have had difficulty sleeping. 0    I have felt sad or miserable. 0    I have been so unhappy that I have been crying. 0    The thought of harming myself has occurred to me. 0    Edinburgh Postnatal Depression Scale Total 0             Health Maintenance Due  Topic Date Due   COVID-19 Vaccine (3 - 2024-25 season) 04/21/2023   Cervical Cancer Screening (HPV/Pap Cotest)  10/11/2023    The following portions of the patient's history were reviewed and updated as appropriate: allergies, current medications, past family history, past medical history, past social history, past surgical history, and problem list.  Review of Systems Pertinent items are noted in  HPI.  Objective:  BP 116/82   Pulse 97   LMP 01/30/2023   Breastfeeding Yes    General:  alert, cooperative, and no distress   Breasts:  not indicated  Lungs:   Heart:  regular rate and rhythm  Abdomen: soft, non-tender; bowel sounds normal; no masses,  no organomegaly   Wound well approximated incision  GU exam:  Pelvic exam: normal external genitalia, vulva, vagina, cervix, uterus and adnexa.        Assessment:   Status post cesarean section  Papanicolaou smear for cervical cancer screening - Plan: Cytology - PAP( [place])   normal postpartum exam.   Plan:   Essential components of care per ACOG recommendations:  1.  Mood and well being: Patient with negative depression screening today. Reviewed local resources for support.  - Patient tobacco use? No.   - hx of drug use? no  2. Infant care and feeding:  -Patient currently breastmilk feeding? Yes. Discussed returning to work and pumping.  -Social determinants of health (SDOH) reviewed in EPIC. No concerns  3. Sexuality, contraception and birth spacing - Patient does not want a pregnancy in the next year.  Desired family size is 4 children.  - Reviewed reproductive life planning. Reviewed contraceptive methods based on pt preferences and effectiveness.  Patient desired Female Sterilization today.   - Discussed  birth spacing of 18 months  4. Sleep and fatigue -Encouraged family/partner/community support of 4 hrs of uninterrupted sleep to help with mood and fatigue  5. Physical Recovery  - Discussed patients delivery and complications. She describes her labor as good. - Patient had a C-section. - Patient has urinary incontinence? No. - Patient is safe to resume physical and sexual activity  6.  Health Maintenance - HM due items addressed Yes - Last pap smear  Diagnosis  Date Value Ref Range Status  10/10/2020   Final   - Negative for intraepithelial lesion or malignancy (NILM)   Pap smear done at today's  visit.  -Breast Cancer screening indicated? No.   7. Chronic Disease/Pregnancy Condition follow up: None  - PCP follow up  Onnie Bilis, MD [facility]

## 2023-12-12 DIAGNOSIS — F411 Generalized anxiety disorder: Secondary | ICD-10-CM | POA: Diagnosis not present

## 2023-12-16 LAB — CYTOLOGY - PAP
Adequacy: ABSENT
Comment: NEGATIVE
Diagnosis: NEGATIVE
High risk HPV: NEGATIVE

## 2023-12-17 DIAGNOSIS — F411 Generalized anxiety disorder: Secondary | ICD-10-CM | POA: Diagnosis not present

## 2023-12-19 DIAGNOSIS — F411 Generalized anxiety disorder: Secondary | ICD-10-CM | POA: Diagnosis not present

## 2023-12-22 DIAGNOSIS — F411 Generalized anxiety disorder: Secondary | ICD-10-CM | POA: Diagnosis not present

## 2023-12-25 DIAGNOSIS — F411 Generalized anxiety disorder: Secondary | ICD-10-CM | POA: Diagnosis not present

## 2023-12-26 DIAGNOSIS — F411 Generalized anxiety disorder: Secondary | ICD-10-CM | POA: Diagnosis not present

## 2023-12-31 DIAGNOSIS — F411 Generalized anxiety disorder: Secondary | ICD-10-CM | POA: Diagnosis not present

## 2024-01-02 DIAGNOSIS — F411 Generalized anxiety disorder: Secondary | ICD-10-CM | POA: Diagnosis not present

## 2024-01-07 DIAGNOSIS — F411 Generalized anxiety disorder: Secondary | ICD-10-CM | POA: Diagnosis not present

## 2024-01-08 DIAGNOSIS — F411 Generalized anxiety disorder: Secondary | ICD-10-CM | POA: Diagnosis not present

## 2024-01-09 DIAGNOSIS — F411 Generalized anxiety disorder: Secondary | ICD-10-CM | POA: Diagnosis not present

## 2024-01-14 DIAGNOSIS — F411 Generalized anxiety disorder: Secondary | ICD-10-CM | POA: Diagnosis not present

## 2024-01-16 DIAGNOSIS — F411 Generalized anxiety disorder: Secondary | ICD-10-CM | POA: Diagnosis not present

## 2024-01-21 DIAGNOSIS — F411 Generalized anxiety disorder: Secondary | ICD-10-CM | POA: Diagnosis not present

## 2024-01-23 DIAGNOSIS — F411 Generalized anxiety disorder: Secondary | ICD-10-CM | POA: Diagnosis not present

## 2024-02-04 DIAGNOSIS — F411 Generalized anxiety disorder: Secondary | ICD-10-CM | POA: Diagnosis not present

## 2024-02-06 DIAGNOSIS — F411 Generalized anxiety disorder: Secondary | ICD-10-CM | POA: Diagnosis not present

## 2024-02-08 DIAGNOSIS — F411 Generalized anxiety disorder: Secondary | ICD-10-CM | POA: Diagnosis not present

## 2024-02-11 DIAGNOSIS — F411 Generalized anxiety disorder: Secondary | ICD-10-CM | POA: Diagnosis not present

## 2024-02-13 DIAGNOSIS — F411 Generalized anxiety disorder: Secondary | ICD-10-CM | POA: Diagnosis not present

## 2024-02-17 DIAGNOSIS — F411 Generalized anxiety disorder: Secondary | ICD-10-CM | POA: Diagnosis not present

## 2024-02-18 DIAGNOSIS — F411 Generalized anxiety disorder: Secondary | ICD-10-CM | POA: Diagnosis not present

## 2024-02-20 ENCOUNTER — Telehealth: Payer: Self-pay

## 2024-02-20 DIAGNOSIS — F411 Generalized anxiety disorder: Secondary | ICD-10-CM | POA: Diagnosis not present

## 2024-02-20 NOTE — Telephone Encounter (Signed)
 Patient LVM on nurse line requesting a call back.   She reported some drainage however unsure from where. VM was hard to hear.   I called patient back x2, however no answer.

## 2024-02-20 NOTE — Telephone Encounter (Signed)
 Patient calls nurse line reporting drainage from cesarean incision.   Patient delivered in March of this year. She reports she healed nicely until recently. She denies any trauma to the area.   She reports ~4 days ago she noticed pain around her incision, she reports on the left side. She reports yesterday she noticed greenish drainage. She reports the area is swollen, red and hot to touch.   She denies any pungent odors. She denies any fevers or chills.   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   Patient agreed with plan.

## 2024-02-25 DIAGNOSIS — F411 Generalized anxiety disorder: Secondary | ICD-10-CM | POA: Diagnosis not present

## 2024-02-27 DIAGNOSIS — F411 Generalized anxiety disorder: Secondary | ICD-10-CM | POA: Diagnosis not present

## 2024-03-02 DIAGNOSIS — F411 Generalized anxiety disorder: Secondary | ICD-10-CM | POA: Diagnosis not present

## 2024-03-04 DIAGNOSIS — F411 Generalized anxiety disorder: Secondary | ICD-10-CM | POA: Diagnosis not present

## 2024-03-09 DIAGNOSIS — F411 Generalized anxiety disorder: Secondary | ICD-10-CM | POA: Diagnosis not present

## 2024-03-11 DIAGNOSIS — F411 Generalized anxiety disorder: Secondary | ICD-10-CM | POA: Diagnosis not present

## 2024-03-14 DIAGNOSIS — F411 Generalized anxiety disorder: Secondary | ICD-10-CM | POA: Diagnosis not present

## 2024-03-16 DIAGNOSIS — F411 Generalized anxiety disorder: Secondary | ICD-10-CM | POA: Diagnosis not present

## 2024-03-17 DIAGNOSIS — F411 Generalized anxiety disorder: Secondary | ICD-10-CM | POA: Diagnosis not present

## 2024-03-18 DIAGNOSIS — F411 Generalized anxiety disorder: Secondary | ICD-10-CM | POA: Diagnosis not present

## 2024-03-23 DIAGNOSIS — F411 Generalized anxiety disorder: Secondary | ICD-10-CM | POA: Diagnosis not present

## 2024-03-25 DIAGNOSIS — F411 Generalized anxiety disorder: Secondary | ICD-10-CM | POA: Diagnosis not present

## 2024-03-30 DIAGNOSIS — F411 Generalized anxiety disorder: Secondary | ICD-10-CM | POA: Diagnosis not present

## 2024-04-01 DIAGNOSIS — F411 Generalized anxiety disorder: Secondary | ICD-10-CM | POA: Diagnosis not present

## 2024-04-06 DIAGNOSIS — F411 Generalized anxiety disorder: Secondary | ICD-10-CM | POA: Diagnosis not present

## 2024-04-08 DIAGNOSIS — F411 Generalized anxiety disorder: Secondary | ICD-10-CM | POA: Diagnosis not present

## 2024-04-13 DIAGNOSIS — F411 Generalized anxiety disorder: Secondary | ICD-10-CM | POA: Diagnosis not present

## 2024-04-15 DIAGNOSIS — F411 Generalized anxiety disorder: Secondary | ICD-10-CM | POA: Diagnosis not present

## 2024-04-20 DIAGNOSIS — F411 Generalized anxiety disorder: Secondary | ICD-10-CM | POA: Diagnosis not present

## 2024-04-22 DIAGNOSIS — F411 Generalized anxiety disorder: Secondary | ICD-10-CM | POA: Diagnosis not present

## 2024-04-28 DIAGNOSIS — F411 Generalized anxiety disorder: Secondary | ICD-10-CM | POA: Diagnosis not present

## 2024-04-30 DIAGNOSIS — F411 Generalized anxiety disorder: Secondary | ICD-10-CM | POA: Diagnosis not present

## 2024-05-05 DIAGNOSIS — F411 Generalized anxiety disorder: Secondary | ICD-10-CM | POA: Diagnosis not present

## 2024-05-06 ENCOUNTER — Encounter: Payer: Self-pay | Admitting: Family Medicine

## 2024-05-07 DIAGNOSIS — F411 Generalized anxiety disorder: Secondary | ICD-10-CM | POA: Diagnosis not present

## 2024-05-11 DIAGNOSIS — F411 Generalized anxiety disorder: Secondary | ICD-10-CM | POA: Diagnosis not present

## 2024-05-13 DIAGNOSIS — F411 Generalized anxiety disorder: Secondary | ICD-10-CM | POA: Diagnosis not present

## 2024-05-19 DIAGNOSIS — F411 Generalized anxiety disorder: Secondary | ICD-10-CM | POA: Diagnosis not present

## 2024-05-26 ENCOUNTER — Ambulatory Visit: Admitting: Family Medicine

## 2024-05-26 ENCOUNTER — Encounter: Payer: Self-pay | Admitting: Family Medicine

## 2024-05-26 VITALS — BP 125/89 | HR 83 | Ht 62.5 in | Wt 253.6 lb

## 2024-05-26 DIAGNOSIS — E65 Localized adiposity: Secondary | ICD-10-CM

## 2024-05-26 DIAGNOSIS — Z13220 Encounter for screening for lipoid disorders: Secondary | ICD-10-CM | POA: Diagnosis not present

## 2024-05-26 DIAGNOSIS — Z23 Encounter for immunization: Secondary | ICD-10-CM

## 2024-05-26 DIAGNOSIS — Z0000 Encounter for general adult medical examination without abnormal findings: Secondary | ICD-10-CM | POA: Diagnosis not present

## 2024-05-26 DIAGNOSIS — Z131 Encounter for screening for diabetes mellitus: Secondary | ICD-10-CM

## 2024-05-26 DIAGNOSIS — D508 Other iron deficiency anemias: Secondary | ICD-10-CM | POA: Diagnosis not present

## 2024-05-26 DIAGNOSIS — Z8759 Personal history of other complications of pregnancy, childbirth and the puerperium: Secondary | ICD-10-CM

## 2024-05-26 MED ORDER — NYSTATIN 100000 UNIT/GM EX POWD: 1.0000 | Freq: Three times a day (TID) | CUTANEOUS | 0 refills | Status: DC

## 2024-05-26 NOTE — Assessment & Plan Note (Signed)
 Repeat CBC and iron studies today

## 2024-05-26 NOTE — Patient Instructions (Signed)
 I sent in nystatin powder to help with irritation under the pannus. Let me know if this does not help. If this is persistently bothersome, let me know.  We collected labs today. I will follow up results with you.

## 2024-05-26 NOTE — Progress Notes (Signed)
    SUBJECTIVE:   Chief compliant/HPI: annual examination  Elsye Kati Riggenbach is a 33 y.o. who presents today for an annual exam.   Having issues and skin irritations after her c-section. Sometimes has burned it while cooking.  She has been putting some herbal remedies on the area to help with the irritation.  OBJECTIVE:   BP 125/89   Pulse 83   Ht 5' 2.5 (1.588 m)   Wt 253 lb 9.6 oz (115 kg)   SpO2 97%   BMI 45.64 kg/m   General: Alert and oriented, in NAD Skin: Warm, dry, and intact; area under pannus with some mild skin breakdown and hyperpigmentation with tenderness HEENT: NCAT, EOM grossly normal, midline nasal septum Cardiac: RRR, no m/r/g appreciated Respiratory: CTAB, breathing and speaking comfortably on RA Extremities: Moves all extremities grossly equally Neurological: No gross focal deficit Psychiatric: Appropriate mood and affect   ASSESSMENT/PLAN:   Assessment & Plan Other iron  deficiency anemia Repeat CBC and iron  studies today. Abdominal pannus I have sent in nystatin powder to help with irritation in this area.  I will also send a referral to plastic surgery to further discuss a panniculectomy at patient's request.  Annual Examination  See AVS for age appropriate recommendations.   No SI/HI. She has a therapist. Blood pressure reviewed and at goal.  The patient has had a tubal ligation 11/03/23.  Considered the following items based upon USPSTF recommendations: HIV testing:discussed and declined Hepatitis C: discussed and declined Hepatitis B:discussed and declined Syphilis if at high risk: discussed and declined GC/CT not at high risk and not ordered. Lipid panel (nonfasting or fasting) discussed based upon AHA recommendations and ordered.  Consider repeat every 4-6 years.  A1c ordered.  Discussed family history, BRCA testing not indicated. Cervical cancer screening: prior Pap reviewed, repeat due in 2030 Immunizations: obtained flu and  HPV today  MyChart Activation:Already signed up  Follow up in 1 year or sooner if indicated.   Stuart Redo, MD [facility]

## 2024-05-27 ENCOUNTER — Ambulatory Visit: Payer: Self-pay | Admitting: Family Medicine

## 2024-05-27 DIAGNOSIS — O9081 Anemia of the puerperium: Secondary | ICD-10-CM

## 2024-05-27 LAB — LIPID PANEL
Chol/HDL Ratio: 2 ratio (ref 0.0–4.4)
Cholesterol, Total: 182 mg/dL (ref 100–199)
HDL: 91 mg/dL (ref >39)
LDL Chol Calc (NIH): 78 mg/dL (ref 0–99)
Triglycerides: 72 mg/dL (ref 0–149)
VLDL Cholesterol Cal: 13 mg/dL (ref 5–40)

## 2024-05-27 LAB — CBC WITH DIFFERENTIAL/PLATELET
Basophils Absolute: 0 x10E3/uL (ref 0.0–0.2)
Basos: 1 %
EOS (ABSOLUTE): 0.1 x10E3/uL (ref 0.0–0.4)
Eos: 2 %
Hematocrit: 36.1 % (ref 34.0–46.6)
Hemoglobin: 11.9 g/dL (ref 11.1–15.9)
Immature Grans (Abs): 0 x10E3/uL (ref 0.0–0.1)
Immature Granulocytes: 0 %
Lymphocytes Absolute: 3.3 x10E3/uL — ABNORMAL HIGH (ref 0.7–3.1)
Lymphs: 42 %
MCH: 27 pg (ref 26.6–33.0)
MCHC: 33 g/dL (ref 31.5–35.7)
MCV: 82 fL (ref 79–97)
Monocytes Absolute: 0.4 x10E3/uL (ref 0.1–0.9)
Monocytes: 5 %
Neutrophils Absolute: 3.9 x10E3/uL (ref 1.4–7.0)
Neutrophils: 50 %
Platelets: 319 x10E3/uL (ref 150–450)
RBC: 4.41 x10E6/uL (ref 3.77–5.28)
RDW: 14.8 % (ref 11.7–15.4)
WBC: 7.8 x10E3/uL (ref 3.4–10.8)

## 2024-05-27 LAB — HEMOGLOBIN A1C
Est. average glucose Bld gHb Est-mCnc: 103 mg/dL
Hgb A1c MFr Bld: 5.2 % (ref 4.8–5.6)

## 2024-05-27 LAB — BASIC METABOLIC PANEL WITH GFR
BUN/Creatinine Ratio: 13 (ref 9–23)
BUN: 11 mg/dL (ref 6–20)
CO2: 20 mmol/L (ref 20–29)
Calcium: 9.3 mg/dL (ref 8.7–10.2)
Chloride: 104 mmol/L (ref 96–106)
Creatinine, Ser: 0.84 mg/dL (ref 0.57–1.00)
Glucose: 79 mg/dL (ref 70–99)
Potassium: 4.2 mmol/L (ref 3.5–5.2)
Sodium: 140 mmol/L (ref 134–144)
eGFR: 94 mL/min/1.73 (ref >59)

## 2024-05-27 LAB — IRON AND TIBC
Iron Saturation: 12 % — ABNORMAL LOW (ref 15–55)
Iron: 42 ug/dL (ref 27–159)
Total Iron Binding Capacity: 344 ug/dL (ref 250–450)
UIBC: 302 ug/dL (ref 131–425)

## 2024-05-27 LAB — FERRITIN: Ferritin: 33 ng/mL (ref 15–150)

## 2024-05-28 MED ORDER — FERROUS SULFATE 325 (65 FE) MG PO TABS
325.0000 mg | ORAL_TABLET | ORAL | 0 refills | Status: AC
Start: 2024-05-28 — End: ?

## 2024-06-19 DIAGNOSIS — F411 Generalized anxiety disorder: Secondary | ICD-10-CM | POA: Diagnosis not present

## 2024-06-23 DIAGNOSIS — F411 Generalized anxiety disorder: Secondary | ICD-10-CM | POA: Diagnosis not present

## 2024-06-30 DIAGNOSIS — F411 Generalized anxiety disorder: Secondary | ICD-10-CM | POA: Diagnosis not present

## 2024-07-03 DIAGNOSIS — F411 Generalized anxiety disorder: Secondary | ICD-10-CM | POA: Diagnosis not present

## 2024-07-07 ENCOUNTER — Encounter: Payer: Self-pay | Admitting: Family Medicine

## 2024-07-07 DIAGNOSIS — F411 Generalized anxiety disorder: Secondary | ICD-10-CM | POA: Diagnosis not present

## 2024-07-10 ENCOUNTER — Ambulatory Visit: Admitting: Family Medicine

## 2024-07-10 VITALS — BP 127/81 | HR 89 | Ht 62.0 in | Wt 259.0 lb

## 2024-07-10 DIAGNOSIS — R350 Frequency of micturition: Secondary | ICD-10-CM

## 2024-07-10 DIAGNOSIS — F411 Generalized anxiety disorder: Secondary | ICD-10-CM | POA: Diagnosis not present

## 2024-07-10 DIAGNOSIS — M7989 Other specified soft tissue disorders: Secondary | ICD-10-CM

## 2024-07-10 DIAGNOSIS — M545 Low back pain, unspecified: Secondary | ICD-10-CM

## 2024-07-10 DIAGNOSIS — E65 Localized adiposity: Secondary | ICD-10-CM

## 2024-07-10 MED ORDER — DICLOFENAC SODIUM 1 % EX GEL: 2.0000 g | Freq: Four times a day (QID) | CUTANEOUS | 1 refills | Status: AC | PRN

## 2024-07-10 MED ORDER — NYSTATIN 100000 UNIT/GM EX CREA: 1.0000 | TOPICAL_CREAM | Freq: Four times a day (QID) | CUTANEOUS | 1 refills | Status: AC

## 2024-07-10 NOTE — Patient Instructions (Addendum)
 You can call the plastic surgeons at: [facility] [address] [place], [place] [address] [phone]  I have sent in PT for your back. Use Voltaren  gel on the lower back. We are avoiding other medications due to effects while breastfeeding.  I have sent in nystatin  cream to help with the rash. Let me know if this does not help.  We collected a urine sample today to test for frequent urination.

## 2024-07-10 NOTE — Progress Notes (Signed)
   SUBJECTIVE:   CHIEF COMPLAINT / HPI:  Discussed the use of AI scribe software for clinical note transcription with the patient, who gave verbal consent to proceed.  History of Present Illness Chelsea Baldwin is a 33 year old female who presents with worsening lower back pain and interest in weight loss options.  Lower back pain - Worsening lower back pain over the past two weeks - Pain described as achy and stiff - Symptoms exacerbated by prolonged standing or sitting - No use of medication for pain - No numbness in the genital area - No incontinence - No leg weakness - Increased frequency of urination without increased fluid intake  Weight management - Interest in weight loss options - Previous trial of low-dose phentermine resulted in increased energy but minimal appetite suppression - Currently breastfeeding, which limits pharmacologic weight loss options - GLP-1a no longer covered under insurance  Localized soft tissue pain - Painful palpable knot at the site of previous IV insertion from C-section - Localized discomfort at the site  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 EXTREMITIES: No cyanosis or edema. MUSCULOSKELETAL: Tenderness in R lumbar paraspinal muscles. NEUROLOGICAL: Cranial nerves grossly intact, moves all extremities without gross motor or sensory deficit. Motor strength intact in upper and lower extremities. Sensation intact in upper and lower extremities.  ASSESSMENT/PLAN:   Assessment & Plan Acute right-sided low back pain without sciatica Worsening over two weeks, likely lumbar paraspinal muscle strain. No neurological deficits. Weight may contribute. - Prescribed tizanidine for  muscle relaxation, advised nighttime use due to sedative effects. - Prescribed voltaren  gel 4x per day instead of systemic NSAIDS due to small risk of fetal harm with meloxicam - Referred to physical therapy for targeted exercises. Abdominal pannus Likely contributing to low back pain. Previous phentermine trial ineffective. Medicaid no longer covers preferred weight loss medications. Breastfeeding limits phentermine use. - Continue lifestyle changes for now and can re-evaluate pharmacologic options once no longer lactating - Switch to nystatin  cream to act as barrier and treat fungal infection - Information provided to contact plastic surgery for possible panniculectomy Nodule of soft tissue At site of previous IV. Likely scar tissue at this time. Can consider biopsy if not improving over time. Urine frequency Unfortunately UA not collected prior to end of the appointment. Can consider obtaining at next visit if persists.  Stuart Redo, MD [facility]

## 2024-07-11 NOTE — Progress Notes (Signed)
 Correction to addended note from 11/21: tizanidine was not prescribed for acute low back pain due to risk of fetal harm while breastfeeding.

## 2024-07-14 DIAGNOSIS — F411 Generalized anxiety disorder: Secondary | ICD-10-CM | POA: Diagnosis not present

## 2024-07-17 DIAGNOSIS — F411 Generalized anxiety disorder: Secondary | ICD-10-CM | POA: Diagnosis not present

## 2024-07-21 DIAGNOSIS — F411 Generalized anxiety disorder: Secondary | ICD-10-CM | POA: Diagnosis not present

## 2024-07-22 ENCOUNTER — Encounter: Payer: Self-pay | Admitting: Plastic Surgery

## 2024-07-22 ENCOUNTER — Ambulatory Visit: Admitting: Plastic Surgery

## 2024-07-22 VITALS — BP 127/87 | HR 77 | Ht 62.0 in | Wt 260.0 lb

## 2024-07-22 DIAGNOSIS — Z6841 Body Mass Index (BMI) 40.0 and over, adult: Secondary | ICD-10-CM | POA: Diagnosis not present

## 2024-07-22 DIAGNOSIS — R21 Rash and other nonspecific skin eruption: Secondary | ICD-10-CM

## 2024-07-22 DIAGNOSIS — L987 Excessive and redundant skin and subcutaneous tissue: Secondary | ICD-10-CM

## 2024-07-22 DIAGNOSIS — E65 Localized adiposity: Secondary | ICD-10-CM | POA: Diagnosis not present

## 2024-07-22 DIAGNOSIS — M545 Low back pain, unspecified: Secondary | ICD-10-CM | POA: Diagnosis not present

## 2024-07-22 DIAGNOSIS — M793 Panniculitis, unspecified: Secondary | ICD-10-CM

## 2024-07-22 NOTE — Progress Notes (Signed)
 Referring Provider Tharon Lung, MD [address] [place],  [place] [address]   CC:  Chief Complaint  Patient presents with   Consult           Chelsea Baldwin is an 33 y.o. female.  HPI: Chelsea Baldwin is a 33 year old female who presents today with complaints of excess skin of fat on her anterior abdominal wall.  She states that she frequently has rashes on the posterior aspect of the pannus and she often has low back pain which she feels is due to this excess skin and fat.  She is currently on a weight loss program with her primary care physician.  No Known Allergies  Outpatient Encounter Medications as of 07/22/2024  Medication Sig   acetaminophen  (TYLENOL ) 500 MG tablet Take 2 tablets (1,000 mg total) by mouth every 8 (eight) hours as needed for mild pain (pain score 1-3) or moderate pain (pain score 4-6).   diclofenac  Sodium (VOLTAREN ) 1 % GEL Apply 2 g topically 4 (four) times daily as needed (pain).   ferrous sulfate  325 (65 FE) MG tablet Take 1 tablet (325 mg total) by mouth every other day.   fluticasone  (FLONASE ) 50 MCG/ACT nasal spray Place 1 spray into both nostrils daily.   hydrocortisone  ointment 0.5 % Apply 1 Application topically 2 (two) times daily as needed for itching.   nystatin  cream (MYCOSTATIN ) Apply 1 Application topically 4 (four) times daily for 14 days. Apply to rash 4 times daily for 2 weeks.   oxyCODONE  (OXY IR/ROXICODONE ) 5 MG immediate release tablet Take 1-2 tablets (5-10 mg total) by mouth every 4 (four) hours as needed for moderate pain (pain score 4-6).   senna-docusate (SENOKOT-S) 8.6-50 MG tablet Take 2 tablets by mouth at bedtime as needed for mild constipation.   No facility-administered encounter medications on file as of 07/22/2024.     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   Low back pain 08/25/2019   Previous cesarean delivery, antepartum 01/01/2020   Desires TOLAC, signed TOLAC consent 01/01/20     Screening for cervical cancer 10/12/2020   VBAC, delivered     Past Surgical History:  Procedure Laterality Date   CESAREAN SECTION  2017   G2    CESAREAN SECTION WITH BILATERAL TUBAL LIGATION N/A 11/03/2023   Procedure: CESAREAN SECTION, WITH BILATERAL TUBAL LIGATION;  Surgeon: Jayne Vonn DEL, MD;  Location: [facility] LD ORS;  Service: Obstetrics;  Laterality: N/A;   TONSILLECTOMY      Family History  Problem Relation Age of Onset   Hypertension Mother    Hypertension Father     Social History   Social History Narrative   Lives with two children (one boy and one girl) and boyfriend [name]      Review of Systems General: Denies fevers, chills, weight loss CV: Denies chest pain, shortness of breath, palpitations Abdomen: Excess skin and fat which is frequently infected with rashes  Physical Exam    07/22/2024    9:22 AM 07/10/2024    9:50 AM 05/26/2024    3:34 PM  Vitals with BMI  Height 5' 2 5' 2   Weight 260 lbs 259 lbs   BMI 47.54 47.36   Systolic 127 127 874  Diastolic 87 81 89  Pulse 77  89     General:  No acute distress,  Alert and oriented, Non-Toxic, Normal speech and affect Abdomen: Patient not examined on this visit Mammogram: Not applicable Assessment/Plan Pannus: Patient states she has a very large pannus and she would like to have it removed.  I discussed with her that I have an upper limit of the BMI of 44 doing panniculectomy's.  This is due to the extraordinarily high risk of complications and the best case scenarios and even higher past a BMI of 40.  The risk of DVT is also higher with a BMI greater than 40.  She understands.  She will return to see me once her weight is closer to 220.  She also ask about rectus repair.  This is a purely cosmetic procedure if she  is interested in this then the requirements for weight loss are much more strict and she will probably be looking at a weight of around 150.  I believe either 1 of these is doable and have encouraged her to continue working on weight loss and return to see me when she is ready to discuss surgery.  Leonce KATHEE [place] 07/22/2024, 4:49 PM

## 2024-07-24 DIAGNOSIS — F411 Generalized anxiety disorder: Secondary | ICD-10-CM | POA: Diagnosis not present

## 2024-07-28 DIAGNOSIS — F411 Generalized anxiety disorder: Secondary | ICD-10-CM | POA: Diagnosis not present

## 2024-07-30 ENCOUNTER — Ambulatory Visit

## 2024-08-03 DIAGNOSIS — F411 Generalized anxiety disorder: Secondary | ICD-10-CM | POA: Diagnosis not present

## 2024-08-04 DIAGNOSIS — F411 Generalized anxiety disorder: Secondary | ICD-10-CM | POA: Diagnosis not present

## 2024-08-07 DIAGNOSIS — F411 Generalized anxiety disorder: Secondary | ICD-10-CM | POA: Diagnosis not present

## 2024-08-11 ENCOUNTER — Ambulatory Visit: Attending: Family Medicine
